# Patient Record
Sex: Female | Born: 1935
Health system: Southern US, Community
[De-identification: ages and names within clinical notes are randomized; demographics above are authoritative.]

## PROBLEM LIST (undated history)

## (undated) DIAGNOSIS — M1990 Unspecified osteoarthritis, unspecified site: Secondary | ICD-10-CM

## (undated) DIAGNOSIS — I442 Atrioventricular block, complete: Secondary | ICD-10-CM

## (undated) DIAGNOSIS — I483 Typical atrial flutter: Secondary | ICD-10-CM

## (undated) DIAGNOSIS — I5032 Chronic diastolic (congestive) heart failure: Secondary | ICD-10-CM

## (undated) DIAGNOSIS — E162 Hypoglycemia, unspecified: Secondary | ICD-10-CM

## (undated) DIAGNOSIS — Z950 Presence of cardiac pacemaker: Secondary | ICD-10-CM

## (undated) DIAGNOSIS — I4819 Other persistent atrial fibrillation: Secondary | ICD-10-CM

## (undated) DIAGNOSIS — I10 Essential (primary) hypertension: Secondary | ICD-10-CM

## (undated) DIAGNOSIS — E079 Disorder of thyroid, unspecified: Secondary | ICD-10-CM

## (undated) DIAGNOSIS — N200 Calculus of kidney: Secondary | ICD-10-CM

## (undated) DIAGNOSIS — M810 Age-related osteoporosis without current pathological fracture: Secondary | ICD-10-CM

## (undated) DIAGNOSIS — E039 Hypothyroidism, unspecified: Secondary | ICD-10-CM

## (undated) DIAGNOSIS — N189 Chronic kidney disease, unspecified: Secondary | ICD-10-CM

## (undated) HISTORY — DX: Hypoglycemia, unspecified: E16.2

## (undated) HISTORY — PX: INSERT / REPLACE / REMOVE PACEMAKER: SUR710

## (undated) HISTORY — PX: APPENDECTOMY: SHX54

## (undated) HISTORY — DX: Atrioventricular block, complete: I44.2

## (undated) HISTORY — DX: Disorder of thyroid, unspecified: E07.9

## (undated) HISTORY — DX: Calculus of kidney: N20.0

## (undated) HISTORY — DX: Age-related osteoporosis without current pathological fracture: M81.0

---

## 1999-07-24 ENCOUNTER — Other Ambulatory Visit: Admission: RE | Admit: 1999-07-24 | Discharge: 1999-07-24 | Payer: Self-pay | Admitting: Obstetrics and Gynecology

## 2000-10-03 ENCOUNTER — Encounter: Admission: RE | Admit: 2000-10-03 | Discharge: 2000-10-03 | Payer: Self-pay | Admitting: Internal Medicine

## 2000-10-03 ENCOUNTER — Encounter: Payer: Self-pay | Admitting: Internal Medicine

## 2000-10-31 ENCOUNTER — Other Ambulatory Visit: Admission: RE | Admit: 2000-10-31 | Discharge: 2000-10-31 | Payer: Self-pay | Admitting: Obstetrics and Gynecology

## 2002-02-24 ENCOUNTER — Encounter: Admission: RE | Admit: 2002-02-24 | Discharge: 2002-02-24 | Payer: Self-pay | Admitting: Internal Medicine

## 2002-02-24 ENCOUNTER — Encounter: Payer: Self-pay | Admitting: Internal Medicine

## 2002-02-26 ENCOUNTER — Other Ambulatory Visit: Admission: RE | Admit: 2002-02-26 | Discharge: 2002-02-26 | Payer: Self-pay | Admitting: *Deleted

## 2003-06-08 ENCOUNTER — Other Ambulatory Visit: Admission: RE | Admit: 2003-06-08 | Discharge: 2003-06-08 | Payer: Self-pay | Admitting: Obstetrics and Gynecology

## 2004-06-02 ENCOUNTER — Encounter: Admission: RE | Admit: 2004-06-02 | Discharge: 2004-06-02 | Payer: Self-pay | Admitting: Internal Medicine

## 2006-03-08 ENCOUNTER — Encounter: Admission: RE | Admit: 2006-03-08 | Discharge: 2006-03-08 | Payer: Self-pay | Admitting: Internal Medicine

## 2006-09-02 ENCOUNTER — Other Ambulatory Visit: Admission: RE | Admit: 2006-09-02 | Discharge: 2006-09-02 | Payer: Self-pay | Admitting: Obstetrics & Gynecology

## 2008-05-17 ENCOUNTER — Encounter: Admission: RE | Admit: 2008-05-17 | Discharge: 2008-05-17 | Payer: Self-pay | Admitting: Internal Medicine

## 2009-06-24 ENCOUNTER — Encounter: Admission: RE | Admit: 2009-06-24 | Discharge: 2009-06-24 | Payer: Self-pay | Admitting: Internal Medicine

## 2013-05-18 ENCOUNTER — Encounter: Payer: Self-pay | Admitting: Cardiovascular Disease

## 2013-05-18 ENCOUNTER — Ambulatory Visit (INDEPENDENT_AMBULATORY_CARE_PROVIDER_SITE_OTHER): Payer: Medicare Other | Admitting: Cardiovascular Disease

## 2013-05-18 VITALS — BP 148/68 | HR 63 | Ht 62.0 in | Wt 122.4 lb

## 2013-05-18 DIAGNOSIS — I456 Pre-excitation syndrome: Secondary | ICD-10-CM

## 2013-05-18 DIAGNOSIS — R531 Weakness: Secondary | ICD-10-CM | POA: Insufficient documentation

## 2013-05-18 DIAGNOSIS — R002 Palpitations: Secondary | ICD-10-CM

## 2013-05-18 DIAGNOSIS — R5381 Other malaise: Secondary | ICD-10-CM

## 2013-05-18 DIAGNOSIS — E039 Hypothyroidism, unspecified: Secondary | ICD-10-CM

## 2013-05-18 DIAGNOSIS — I10 Essential (primary) hypertension: Secondary | ICD-10-CM | POA: Insufficient documentation

## 2013-05-18 NOTE — Patient Instructions (Signed)
Your physician has recommended that you wear an event monitor. Event monitors are medical devices that record the heart's electrical activity. Doctors most often [place] these monitors to diagnose arrhythmias. Arrhythmias are problems with the speed or rhythm of the heartbeat. The monitor is a small, portable device. You can wear one while you do your normal daily activities. This is usually used to diagnose what is causing palpitations/syncope (passing out). He has ordered that you wear this for 30 days.  Your physician has requested that you have an echocardiogram. Echocardiography is a painless test that uses sound waves to create images of your heart. It provides your doctor with information about the size and shape of your heart and how well your heart's chambers and valves are working. This procedure takes approximately one hour. There are no restrictions for this procedure.  Your physician recommends that you schedule a follow-up appointment in: in 6 weeks.

## 2013-05-18 NOTE — Progress Notes (Signed)
Patient ID: Brandi Chambers, female   DOB: 11-22-1932, 79 y.o.   MRN: [id number]    PATIENT PROFILE: This Goral a 79 year old female who presents for cardiology evaluation through the courtesy of Dr. Dagmar Hait for further evaluation of weakness. There is a history of possible WPW.   HPI: Patient states that in the 1970s she was given a diagnosis of possible Wolff-Parkinson-White syndrome. Apparently she never had any definitive  electrophysiologic assessment. She recalls having some palpitations in the past for which he was put on verapamil and these seemed to have improved. Remotely she had seen Dr. Mare Ferrari. There is some record of possible stress testing but I do not have specifics. She recently saw Dr. Dagmar Hait on 05/08/2013. Laboratory revealed a glucose of 86 BUN 19 creatinine 1.0. Potassium was 4.9. TSH was 0.46 and free T4 was 1.2 on her current dose of Synthroid thyroidism at 75 mcg. Patient states when she takes her blood pressure at home he typically runs in the 130s to 140s. She does admit to increased anxiety when she comes to the doctor and at times her blood pressure in the physician's office is elevated. She at times notes a sensation of palpitations. She denies any associated presyncope. She denies any syncope or she does drink caffeine approximately 2 drinks per day. There is no alcohol use. She does exercise and remains active and denies any exertional precipitation of chest pain or arrhythmia. She presents to the office today for Cardiologic evaluation  Past Medical History  Diagnosis Date  . Thyroid disease   . Osteoporosis   . Nephrolithiasis   . Hypoglycemia   . Chest pain     No past surgical history on file.  No Known Allergies  Current Outpatient Prescriptions  Medication Sig Dispense Refill  . aspirin 81 MG tablet Take 81 mg by mouth daily.      Marland Kitchen levothyroxine (SYNTHROID, LEVOTHROID) 75 MCG tablet Take 75 mcg by mouth daily before breakfast.      . verapamil (CALAN) 80  MG tablet Take 80 mg by mouth daily.       No current facility-administered medications for this visit.    Socially she is married for 61 years she completed eighth grade. She remains active. She only minimally smoked remotely and quit in 1969. He does walk on a treadmill without problems the  Family History  Problem Relation Age of Onset  . Breast cancer Mother   . Lung cancer Father     ROS is negative for fever chills night sweats. She denies any weight gain or loss recently. She denies any bleeding. She denies any shortness of breath. She denies visual symptoms. At times she does note some mild dizziness. At times she has noted some mild nausea. She denies any exercise-induced chest pressure. Denies any difficulty with sleep. Her sleep is restorative . She is unaware of nocturnal palpitations. She denies change in bowel or bladder habits. She denies paresthesias. She denies any psychiatric disorders per other system review is negative.  PE BP 148/68  Pulse 63  Ht 5\' 2"  (1.575 m)  Wt 122 lb 6.4 oz (55.52 kg)  BMI 22.38 kg/m2 General: Alert, oriented, no distress.  HEENT: Normocephalic, atraumatic. Pupils round and reactive; sclera anicteric; Funduscopic exam reveals bilateral cataracts. I did not see any AV nicking hemorrhages or exudates. Nose without nasal septal hypertrophy Mouth/Parynx benign; Mallinpatti scale 2 Neck: No JVD, no carotid briuts Lungs: clear to ausculatation and percussion; no wheezing or rales Heart:  RRR, s1 s2 normal 1/6 systolic murmur in the aortic region. There was no S3 gallop. She did not have any rub.  Abdomen: soft, nontender; no hepatosplenomehaly, BS+; abdominal aorta nontender and not dilated by palpation. Pulses 2+ Extremities: no clubbinbg cyanosis or edema, Homan's sign negative  Neurologic: grossly nonfocal    ECG: Reveals normal sinus rhythm with mild sinus arrhythmia. PR interval is normal at 140 ms and does not demonstrate any preexcitation  or accelerated AV conduction. Her QTc interval is normal at 490 ms. There are no significant ST abnormalities.  LABS:  BMET No results found for this basename: na, k, cl, co2, glucose, bun, creatinine, calcium, gfrnonaa, gfraa     Hepatic Function Panel  No results found for this basename: prot, albumin, ast, alt, alkphos, bilitot, bilidir, ibili     CBC No results found for this basename: wbc, rbc, hgb, hct, plt, mcv, mch, mchc, rdw, neutrabs, lymphsabs, monoabs, eosabs, basosabs     BNP No results found for this basename: probnp    Lipid Panel  No results found for this basename: chol, trig, hdl, cholhdl, vldl, ldlcalc     RADIOLOGY: No results found.   ASSESSMENT AND PLAN: My impression is that Ms. Lye is a very pleasant 79 year old female who has a history of palpitations remotely paralleling since 1970s has been told of having possible Wolff-Parkinson-White syndrome. Presently, her EKG does not demonstrate any evidence for preexcitation or accelerated AV conduction with a normal PR interval and I am not certain that she truly has WPW. I am recommending she wear a 30 day cardiac monitor to see if we can cup any potential arrhythmias. I am also scheduling her for a 2-D echo Doppler study. Her blood pressure in the office today was elevated reflective of labile hypertension with a systolic blood pressure by me at 180. She tells me she often gets nervous coming to the doctor told her blood pressure typically is in the [id number] range systolically. Did review her laboratory. I will not make additional medication changes presently. I will wait for the above studies prior to further recommendations.    Troy Sine, MD, [facility] 05/18/2013 3:54 PM

## 2013-05-20 ENCOUNTER — Encounter: Payer: Self-pay | Admitting: Cardiovascular Disease

## 2013-05-26 ENCOUNTER — Ambulatory Visit (HOSPITAL_COMMUNITY)
Admission: RE | Admit: 2013-05-26 | Discharge: 2013-05-26 | Disposition: A | Discharge status: Home / Self Care | Payer: Medicare Other | Source: Ambulatory Visit | Attending: Cardiovascular Disease | Admitting: Cardiovascular Disease

## 2013-05-26 DIAGNOSIS — I079 Rheumatic tricuspid valve disease, unspecified: Secondary | ICD-10-CM | POA: Insufficient documentation

## 2013-05-26 DIAGNOSIS — R002 Palpitations: Secondary | ICD-10-CM | POA: Insufficient documentation

## 2013-05-26 DIAGNOSIS — I456 Pre-excitation syndrome: Secondary | ICD-10-CM

## 2013-05-26 DIAGNOSIS — R42 Dizziness and giddiness: Secondary | ICD-10-CM | POA: Insufficient documentation

## 2013-05-26 DIAGNOSIS — I080 Rheumatic disorders of both mitral and aortic valves: Secondary | ICD-10-CM | POA: Insufficient documentation

## 2013-05-26 DIAGNOSIS — I10 Essential (primary) hypertension: Secondary | ICD-10-CM | POA: Insufficient documentation

## 2013-05-26 DIAGNOSIS — I319 Disease of pericardium, unspecified: Secondary | ICD-10-CM | POA: Insufficient documentation

## 2013-05-26 DIAGNOSIS — E079 Disorder of thyroid, unspecified: Secondary | ICD-10-CM | POA: Insufficient documentation

## 2013-05-26 NOTE — Progress Notes (Signed)
2D Echo Performed 05/26/2013    Marygrace Drought, RCS

## 2013-07-07 ENCOUNTER — Encounter: Payer: Self-pay | Admitting: *Deleted

## 2013-07-10 ENCOUNTER — Encounter: Payer: Self-pay | Admitting: Cardiovascular Disease

## 2013-07-10 ENCOUNTER — Ambulatory Visit (INDEPENDENT_AMBULATORY_CARE_PROVIDER_SITE_OTHER): Payer: Medicare Other | Admitting: Cardiovascular Disease

## 2013-07-10 VITALS — BP 142/86 | HR 60 | Ht 62.5 in | Wt 120.6 lb

## 2013-07-10 DIAGNOSIS — E039 Hypothyroidism, unspecified: Secondary | ICD-10-CM

## 2013-07-10 DIAGNOSIS — I498 Other specified cardiac arrhythmias: Secondary | ICD-10-CM

## 2013-07-10 DIAGNOSIS — I10 Essential (primary) hypertension: Secondary | ICD-10-CM

## 2013-07-10 DIAGNOSIS — I471 Supraventricular tachycardia: Secondary | ICD-10-CM

## 2013-07-10 DIAGNOSIS — R531 Weakness: Secondary | ICD-10-CM

## 2013-07-10 DIAGNOSIS — R002 Palpitations: Secondary | ICD-10-CM

## 2013-07-10 DIAGNOSIS — R5381 Other malaise: Secondary | ICD-10-CM

## 2013-07-10 MED ORDER — METOPROLOL TARTRATE 50 MG PO TABS: 50.0000 mg | ORAL_TABLET | Freq: Two times a day (BID) | ORAL | Status: DC

## 2013-07-10 NOTE — Patient Instructions (Addendum)
You have been referred to [name] at [facility] has recommended you make the following change in your medication: STOP Verapramil start Metoprolol 50 mg twice a day

## 2013-07-26 ENCOUNTER — Encounter: Payer: Self-pay | Admitting: Cardiovascular Disease

## 2013-07-26 DIAGNOSIS — I471 Supraventricular tachycardia: Secondary | ICD-10-CM | POA: Insufficient documentation

## 2013-07-26 NOTE — Progress Notes (Signed)
Patient ID: VAANYA ALVORD, female   DOB: 06-Jul-1932, 79 y.o.   MRN: [id number]     HPI: Ms. Latresha Cina presents to the office today for followup evaluation of her initial 05/18/2013 cardiology evaluation.  Patient states that in the 1970s she was given a diagnosis of possible Wolff-Parkinson-White syndrome when she was seeing Dr Rueben Bash .  She never had any subsequent definitive  electrophysiologic assessment. She was started on  on verapamil and her palpitations seemed to have improved. Subsequently, she had seen Dr. Mare Ferrari. There is some record of possible stress testing but I do not have specifics. She saw Dr. Dagmar Hait on 05/08/2013. Laboratory revealed a glucose of 86 BUN 19 creatinine 1.0. Potassium was 4.9. TSH was 0.46 and free T4 was 1.2 on her current dose of Synthroid thyroidism at 75 mcg. Patient states when she takes her blood pressure at home he typically runs in the 130s to 140s. She does admit to increased anxiety when she comes to the doctor and at times her blood pressure in the physician's office is elevated.  When I saw her initially, her EKG did not reveal any definitive evidence for preexcitation or accelerated AV conduction. Her PR  was normal at 140 msec. I did recommend she wear a CardioNet monitor and also scheduled her for 2-D echo Doppler study. On echocardiography her ejection fraction was in the range of 55-60%. She had grade 2 diastolic dysfunction. There was evidence for aortic sclerosis without stenosis with mild AR and she had mitral annular calcification with mild mitral regurgitation. One CardioNet monitoring, she apparently has been demonstrated to have the bursa atrial tachycardia probable SVT as well as frequent PACs. Rates vary and were in the range of 150 beats per minute and occurred in paroxysms of 7, 14, beats per also had a 12 beat run 17 beat run at 159 and 161. However, apparently on 06/05/2013 was a rapid episode with reported heart rate at 229 beats per  minute. These all broke spontaneously to she presents for evaluation.  Past Medical History  Diagnosis Date  . Thyroid disease   . Osteoporosis   . Nephrolithiasis   . Hypoglycemia   . Chest pain     No past surgical history on file.  No Known Allergies  Current Outpatient Prescriptions  Medication Sig Dispense Refill  . aspirin 81 MG tablet Take 81 mg by mouth as needed.       . diazepam (VALIUM) 5 MG tablet Take 5 mg by mouth as needed for anxiety. Takes 1/2 tablet.      Marland Kitchen levothyroxine (SYNTHROID, LEVOTHROID) 75 MCG tablet Take 75 mcg by mouth daily before breakfast.      . metoprolol (LOPRESSOR) 50 MG tablet Take 1 tablet (50 mg total) by mouth 2 (two) times daily.  60 tablet  6   No current facility-administered medications for this visit.    Socially she is married for 61 years she completed eighth grade. She remains active. She only minimally smoked remotely and quit in 1969. He does walk on a treadmill without problems the  Family History  Problem Relation Age of Onset  . Breast cancer Mother   . Lung cancer Father     ROS is negative for fever chills night sweats. She does admit to a sensation of lightheadedness when her heart rate speeds up. There is no frank syncope. She denies associated chest pressure. She denies any weight gain or loss recently. She denies any bleeding. She denies  any shortness of breath. She denies visual symptoms.  At times she has noted some mild nausea.  Denies any difficulty with sleep. Her sleep is restorative . She is unaware of nocturnal palpitations. She denies change in bowel or bladder habits. She denies paresthesias. She denies any psychiatric disorders. other system review is negative.  PE BP 142/86  Pulse 60  Ht 5' 2.5" (1.588 m)  Wt 120 lb 9.6 oz (54.704 kg)  BMI 21.69 kg/m2 General: Alert, oriented, no distress.  HEENT: Normocephalic, atraumatic. Pupils round and reactive; sclera anicteric; Funduscopic exam reveals bilateral  cataracts. I did not see any AV nicking hemorrhages or exudates. Nose without nasal septal hypertrophy Mouth/Parynx benign; Mallinpatti scale 2 Neck: No JVD, no carotid briuts Lungs: clear to ausculatation and percussion; no wheezing or rales Heart: RRR, s1 s2 normal 1/6 systolic murmur in the aortic region. There was no S3 gallop. She did not have any rub.  Abdomen: soft, nontender; no hepatosplenomehaly, BS+; abdominal aorta nontender and not dilated by palpation. Pulses 2+ Extremities: no clubbinbg cyanosis or edema, Homan's sign negative  Neurologic: grossly nonfocal    ECG: Reveals normal sinus rhythm with mild sinus arrhythmia. PR interval is normal at 144 ms and does not demonstrate any preexcitation or accelerated AV conduction. Her QTc interval is normal at 420 ms. There are no significant ST abnormalities.  LABS:  BMET No results found for this basename: na,  k,  cl,  co2,  glucose,  bun,  creatinine,  calcium,  gfrnonaa,  gfraa     Hepatic Function Panel  No results found for this basename: prot,  albumin,  ast,  alt,  alkphos,  bilitot,  bilidir,  ibili     CBC No results found for this basename: wbc,  rbc,  hgb,  hct,  plt,  mcv,  mch,  mchc,  rdw,  neutrabs,  lymphsabs,  monoabs,  eosabs,  basosabs     BNP No results found for this basename: probnp    Lipid Panel  No results found for this basename: chol,  trig,  hdl,  cholhdl,  vldl,  ldlcalc     RADIOLOGY: No results found.   ASSESSMENT AND PLAN:  Ms. Bladen is a 79 year old female with a long history of palpitations and has been treated with verapamil for many years. On verapamil, she's been demonstrated to have recurrent short-lived episodes of probable supraventricular tachycardia with rates in the 130s to 160s and on one instance she did have a significant episode of tachycardia up to 229 beats per minute. Her EKG does not definitively reveal preexcitation. Her PR interval is normal. I am recommending  she discontinue her Verapamil SR and in its place will start beta-blockade with initially Lopressor 50 mg twice a day. I am referring her to Dr. [name] for electrophysiologic evaluation and possible ablation.  Troy Sine, MD, [facility] 07/26/2013 7:47 AM

## 2013-08-03 ENCOUNTER — Telehealth: Payer: Self-pay | Admitting: Cardiovascular Disease

## 2013-08-03 NOTE — Telephone Encounter (Signed)
Returned call.  Pt stated she is having a little problem with the medication.  Stated she can't remember anything, but it is controlling her BP.  Stated she has been very forgetful since starting med.  Pt stated she is supposed to take one in the morning and one at night.  Stated the last two nights and it seems like it's a little better.  Denied means to check BP at home.  Also c/o dizziness today ONLY.  Stated palpitations seem to be alright per pt.  Pt informed Dr. Claiborne Billings will be notified for further instructions.  Pt verbalized understanding and agreed w/ plan.  Message forwarded to Dr. Arnette Norris, CMA.

## 2013-08-03 NOTE — Telephone Encounter (Signed)
Brandi Chambers is having problem with BP med metroprolol  Is very dizzy and forgetful

## 2013-08-04 NOTE — Telephone Encounter (Signed)
Message forwarded to L. Dorene Ar, NP for further instructions.

## 2013-08-04 NOTE — Telephone Encounter (Signed)
Call to pt and line busy x 3.    Returned call to pharmacy to Amy and informed RN is trying to contact pt for appt for evaluation and unable to reach her.  Asked that she inform pt if she calls back.  Agreed and stated she will try to contact pt too.

## 2013-08-04 NOTE — Telephone Encounter (Signed)
Returned call to pt and informed L. Dorene Ar, NP would like her to come in for evaluation for BP check tomorrow.  Pt stated she has transportation and agreed not drive.  Appt scheduled for tomorrow (8.20.14) at 9:20am for BP check.

## 2013-08-04 NOTE — Telephone Encounter (Signed)
Pharmacist called said pt came in still having dizzy spells-they took her BP it was 128/60. She already cut back to 25mg  of Metoprolol bid-should she cut back more on medicine or try something else?

## 2013-08-05 ENCOUNTER — Encounter: Payer: Self-pay | Admitting: Cardiology

## 2013-08-05 ENCOUNTER — Ambulatory Visit (INDEPENDENT_AMBULATORY_CARE_PROVIDER_SITE_OTHER): Payer: Medicare Other | Admitting: Cardiology

## 2013-08-05 VITALS — BP 126/82 | HR 44 | Ht 62.5 in | Wt 122.1 lb

## 2013-08-05 DIAGNOSIS — R001 Bradycardia, unspecified: Secondary | ICD-10-CM | POA: Insufficient documentation

## 2013-08-05 DIAGNOSIS — E039 Hypothyroidism, unspecified: Secondary | ICD-10-CM

## 2013-08-05 DIAGNOSIS — I471 Supraventricular tachycardia: Secondary | ICD-10-CM

## 2013-08-05 DIAGNOSIS — R0789 Other chest pain: Secondary | ICD-10-CM

## 2013-08-05 DIAGNOSIS — R002 Palpitations: Secondary | ICD-10-CM

## 2013-08-05 MED ORDER — METOPROLOL TARTRATE 25 MG PO TABS: 12.5000 mg | ORAL_TABLET | Freq: Two times a day (BID) | ORAL | Status: DC

## 2013-08-05 NOTE — Assessment & Plan Note (Signed)
Secondary to bradycardia. If pressure continues episodically once her rate is at normal rate she would need Lexiscan myoview.  No acute EKG changes except slow rate.

## 2013-08-05 NOTE — Patient Instructions (Signed)
No metoprolol tonight  In AM begin half of 25 mg tablet to equal 12.5 mg, take twice a day.    If you have break through fast heart rate, take one  25 mg tab extra. For the day.  Call if you have increased Heart rate.   If you have more chest pressure call us.

## 2013-08-05 NOTE — Assessment & Plan Note (Signed)
Stable followed by PCP  

## 2013-08-05 NOTE — Progress Notes (Signed)
08/05/2013   PCP: Tivis Ringer, MD   Chief Complaint  Patient presents with  . hypotension    associated with lightheadedness/dizziness; 7/25 with TK start Metoprolol 50 mg BID (changed from verapamil) - has been taking 25mg  at night since saturday, and has not helped; c/o palpitations, but not as frequent; only took 25mg  lopressor this AM    Primary Cardiologist: Dr. Claiborne Billings  HPI: 79 year old white female presents today at our request for increased dizziness, lightheadedness, feeling as if she may pass out with recent adjustment in medication.  In the 1970s she was given a diagnosis of possible Wolff-Parkinson-White syndrome when she was seeing Dr Rueben Bash . She never had any subsequent definitive electrophysiologic assessment. She was started on on verapamil and her palpitations seemed to have improved. Subsequently, she had seen Dr. Mare Ferrari. There is some record of possible stress testing but I do not have specifics. She saw Dr. Dagmar Hait on 05/08/2013. Laboratory revealed a glucose of 86 BUN 19 creatinine 1.0. Potassium was 4.9. TSH was 0.46 and free T4 was 1.2 on her current dose of Synthroid thyroidism at 75 mcg  With her original visit to Dr. Claiborne Billings her EKG did not reveal any definitive evidence for preexcitation or accelerated AV conduction. Her PR was normal at 140 msec.  She did wear a CardioNet monitor and had a 2-D echo Doppler study. On echocardiography her ejection fraction was in the range of 55-60%. She had grade 2 diastolic dysfunction. There was evidence for aortic sclerosis without stenosis with mild AR and she had mitral annular calcification with mild mitral regurgitation. One CardioNet monitoring, she apparently demonstrated to have burst of atrial tachycardia probable SVT as well as frequent PACs. Rates vary and were in the range of 150 beats per minute and occurred in paroxysms of 7, 14, beats per also had a 12 beat run 17 beat run at 159 and 161. However,  apparently on 06/05/2013 was a rapid episode with reported heart rate at 229 beats per minute. These all broke spontaneously.  Her Calan was stopped and Lopressor 50 mg twice a day was added to her medical regimen. Since that time she has become more and more lightheaded and dizzy feeling quite weak she especially felt bad yesterday and called our office. Her blood pressure yesterday was 0000000 systolic.  We asked her to come in.  Today she still feels weak she decrease the metoprolol to 25 mg last night and 25 mg this morning here in the office she is not orthostatic but her heart rate is 39 beats per minute she is in a sinus bradycardia and other than bradycardia and no acute EKG changes.  She did relate 2 episodes of mild brief chest discomfort which may be related to the heart rate alone.  She does have some nausea at times with the bradycardia as well.    No Known Allergies  Current Outpatient Prescriptions  Medication Sig Dispense Refill  . aspirin 81 MG tablet Take 81 mg by mouth as needed.       . diazepam (VALIUM) 5 MG tablet Take 5 mg by mouth as needed for anxiety. Takes 1/2 tablet.      Marland Kitchen levothyroxine (SYNTHROID, LEVOTHROID) 75 MCG tablet Take 75 mcg by mouth daily before breakfast.      . metoprolol tartrate (LOPRESSOR) 25 MG tablet Take 0.5 tablets (12.5 mg total) by mouth 2 (two) times daily.  30 tablet  3   No current facility-administered  medications for this visit.    Past Medical History  Diagnosis Date  . Thyroid disease   . Osteoporosis   . Nephrolithiasis   . Hypoglycemia   . Chest pain   . Tachycardia   . Echocardiogram abnormal 2014    grade 2 diastolic dysfunction, EF 0000000, mild AR, mitral anular calcification with mild MR   . Paroxysmal SVT (supraventricular tachycardia) 2014    poss. W-P-W, breakthrough on Verapamil    History reviewed. No pertinent past surgical history.  [id number] colds or fevers, no weight changes Skin:no rashes or  ulcers HEENT:no blurred vision, no congestion CV:see HPI PUL:no SOB GI:no diarrhea constipation or melena, no indigestion GU:no hematuria, no dysuria MS:no joint pain, no claudication Neuro:no syncope though so weak she felt she may pass out, + lightheadedness, + dizziness Endo:no diabetes, + thyroid disease stable  PHYSICAL EXAM BP 126/82  Pulse 44  Ht 5' 2.5" (1.588 m)  Wt 122 lb 1.6 oz (55.384 kg)  BMI 21.96 kg/m2 General:Pleasant affect, NAD Skin:Warm and dry, brisk capillary refill HEENT:normocephalic, sclera clear, mucus membranes moist Neck:supple, no JVD, no bruits  Heart:S1S2 RRR though slow, without murmur, gallup, rub or click Lungs:clear without rales, rhonchi, or wheezes [id number], non tender, + BS, do not palpate liver spleen or masses Ext:no lower ext edema, 2+ pedal pulses, 2+ radial pulses Neuro:alert and oriented, MAE, follows commands, + facial symmetry  EKG: Sinus bradycardia rate of 39 no acute EKG changes otherwise, just much slower.  ASSESSMENT AND PLAN Symptomatic bradycardia With lopressor 50 mg twice a day HR down to 30's and she became symptomatic with dizziness, lightheadedness.  Today BP stable but HR 39 S [place].  She had decreased metoprolol to 25 the last 2 doses. She will hold tonight status and tomorrow begin 12.5 mg twice a day she has regular tachycardia she will take one extra dose of 25 mg in a 24-hour period.  She will call as this she has breakthrough tachycardia. She is to see Dr. [name] 08/24/2013.  I did discuss this with Dr. Sallyanne Kuster.  SVT (supraventricular tachycardia) History of years of SVT thought to be WPW.  Has been treated with Calan-and only the brand name worked-80 mg daily for many years but recently was having breakthrough tachycardia that is symptomatic. Calan was changed to Lopressor.  Hypothyroid Stable followed by PCP.  Chest pressure Secondary to bradycardia. If pressure continues episodically once her rate is at normal  rate she would need Lexiscan myoview.  No acute EKG changes except slow rate.   We'll have her come back Thursday for an EKG to ensure her heart rate has improved.

## 2013-08-05 NOTE — Assessment & Plan Note (Addendum)
With lopressor 50 mg twice a day HR down to 30's and she became symptomatic with dizziness, lightheadedness.  Today BP stable but HR 39 S Brady.  She had decreased metoprolol to 25 the last 2 doses. She will hold tonight status and tomorrow begin 12.5 mg twice a day she has regular tachycardia she will take one extra dose of 25 mg in a 24-hour period.  She will call as this she has breakthrough tachycardia. She is to see Dr. [name] 08/24/2013.  I did discuss this with Dr. Sallyanne Kuster.

## 2013-08-05 NOTE — Assessment & Plan Note (Signed)
History of years of SVT thought to be WPW.  Has been treated with Calan-and only the brand name worked-80 mg daily for many years but recently was having breakthrough tachycardia that is symptomatic. Calan was changed to Lopressor.

## 2013-08-06 ENCOUNTER — Telehealth: Payer: Self-pay | Admitting: *Deleted

## 2013-08-06 NOTE — Telephone Encounter (Signed)
Call me back @ [phone].

## 2013-08-06 NOTE — Telephone Encounter (Signed)
Patient returned a call to me. Per [name] she requested for the patient to come today and get a EKG. Patient states that she has just gotten home and prefers not to come back to town. Told her per Mickel Baas she can come tomorrow. She cannot come then neither. She will be going out of town in the morning around 7:00 a.m. During the conversation she informed me that she had elevated HR this morning and took "1/2 of a pill, and then in about 1 hour I felt fine." She informs [place] that her heart rate was 58 when she checked it around 11:30 a.m. I told her per [name] to monitor her heartrate over the weekend. Call us on Monday to inform us of how she is feeling, and what her heartrate has been throughout the weekend. Patient voiced understanding and states that she will call us sooner if she has problems over the weekend.

## 2013-08-14 ENCOUNTER — Ambulatory Visit: Payer: Medicare Other | Admitting: Internal Medicine

## 2013-08-24 ENCOUNTER — Encounter: Payer: Self-pay | Admitting: Internal Medicine

## 2013-08-24 ENCOUNTER — Ambulatory Visit (INDEPENDENT_AMBULATORY_CARE_PROVIDER_SITE_OTHER): Payer: Medicare Other | Admitting: Internal Medicine

## 2013-08-24 VITALS — BP 142/78 | HR 58 | Ht 62.0 in | Wt 121.0 lb

## 2013-08-24 DIAGNOSIS — R002 Palpitations: Secondary | ICD-10-CM

## 2013-08-24 DIAGNOSIS — I498 Other specified cardiac arrhythmias: Secondary | ICD-10-CM

## 2013-08-24 DIAGNOSIS — R001 Bradycardia, unspecified: Secondary | ICD-10-CM

## 2013-08-24 MED ORDER — METOPROLOL TARTRATE 25 MG PO TABS: ORAL_TABLET | ORAL | Status: DC

## 2013-08-24 NOTE — Patient Instructions (Addendum)
Your physician recommends that you schedule a follow-up appointment in 3 months with Dr Allred    

## 2013-08-24 NOTE — Progress Notes (Signed)
Primary Care Physician: Tivis Ringer, MD Referring Physician:  Dr Corky Downs   Brandi Chambers is a 79 y.o. female with a h/o SVT who presents for EP consultation.  She reports having episodic palpitations for years.  She carries a diagnosis of SVT and has previously been treated with good success with verapamil.  She reports that recently, she began having breakthrough of her SVT about once per month.  She reports abrupt onset of tachypalpitations with fatigue.  She has found that by massaging her neck that she could terminate episodes.  She is unaware of precipitants or triggers for episodes.  She reports that episodes typically last less than a minute.  With increased frequency of events, she has an event monitor placed.  This documented several episodes of nonsustained long RP tachycardia 150-160 bpm.  She also had a single episode of short RP tachycardia at 220 bpm which spontaneously terminated.   She was placed on metoprolol which initially caused dizziness.  With reduction of dosing, she has had resolution of her dizziness.  She has had no SVT in over a month.  Presently, she is very pleased with her health state.   Today, she denies symptoms of chest pain, shortness of breath, orthopnea, PND, lower extremity edema, dizziness, presyncope, syncope, or neurologic sequela. The patient is tolerating medications without difficulties and is otherwise without complaint today.   Past Medical History  Diagnosis Date  . Thyroid disease   . Osteoporosis   . Nephrolithiasis   . Hypoglycemia   . Chest pain   . Echocardiogram abnormal 2014    grade 2 diastolic dysfunction, EF 0000000, mild AR, mitral anular calcification with mild MR   . Paroxysmal SVT (supraventricular tachycardia) 2014   No past surgical history on file.  Current Outpatient Prescriptions  Medication Sig Dispense Refill  . diazepam (VALIUM) 5 MG tablet Take 5 mg by mouth as needed for anxiety. Takes 1/2 tablet.      Marland Kitchen  levothyroxine (SYNTHROID, LEVOTHROID) 75 MCG tablet Take 75 mcg by mouth daily before breakfast.      . metoprolol tartrate (LOPRESSOR) 25 MG tablet Take 1/2 tablet in the am and 1/4 tablet in the afternoon  30 tablet  11   No current facility-administered medications for this visit.    No Known Allergies  History   Social History  . Marital Status: Married    Spouse Name: N/A    Number of Children: 1  . Years of Education: N/A   Occupational History  . Not on file.   Social History Main Topics  . Smoking status: Former Smoker    Types: Cigarettes    Quit date: 05/18/1969  . Smokeless tobacco: Never Used  . Alcohol Use: No  . Drug Use: No  . Sexual Activity: Not on file   Other Topics Concern  . Not on file   Social History Narrative   Lives in [place] with her spouse.  Retired    Family History  Problem Relation Age of Onset  . Breast cancer Mother   . Lung cancer Father     ROS- All systems are reviewed and negative except as per the HPI above  Physical Exam: Filed Vitals:   08/24/13 1450  BP: 142/78  Pulse: 58  Height: 5\' 2"  (1.575 m)  Weight: 121 lb (54.885 kg)    GEN- The patient is well appearing, alert and oriented x 3 today.   Head- normocephalic, atraumatic Eyes-  Sclera clear, conjunctiva pink  Ears- hearing intact Oropharynx- clear Neck- supple, no JVP Lymph- no cervical lymphadenopathy Lungs- Clear to ausculation bilaterally, normal work of breathing Heart- Regular rate and rhythm, no murmurs, rubs or gallops, PMI not laterally displaced GI- soft, NT, ND, + BS Extremities- no clubbing, cyanosis, or edema MS- no significant deformity or atrophy Skin- no rash or lesion Psych- euthymic mood, full affect Neuro- strength and sensation are intact  EKG today reveals sinus rhythm 58 bpm, otherwise normal ekg, no obvious preexcitation Echo reviewed Epic notes and Dr Ermalinda Memos notes are reviewed Event monitor is reviewed  Assessment and  Plan:  1. SVT The patient has documented short RP SVT.  She has previously failed medical therapy with verapamil but feels that she has done better with metoprolol. Therapeutic strategies for supraventricular tachycardia including medicine and ablation were discussed in detail with the patient today. Risk, benefits, and alternatives to EP study and radiofrequency ablation were also discussed in detail today. These risks include but are not limited to stroke, bleeding, vascular damage, tamponade, perforation, damage to the heart and other structures, AV block requiring pacemaker, worsening renal function, and death. The patient understands these risk and is clear at this time that she would like to avoid ablation. She will reconsider if her SVT burden increases.  I will therefore see her in 3 months for further discussion. She will follow with Dr Georgina Peer as scheduled in the interim.

## 2013-10-25 ENCOUNTER — Emergency Department (HOSPITAL_COMMUNITY)
Admission: EM | Admit: 2013-10-25 | Discharge: 2013-10-25 | Disposition: A | Discharge status: Home / Self Care | Payer: Medicare Other | Attending: Emergency Medicine | Admitting: Emergency Medicine

## 2013-10-25 ENCOUNTER — Encounter (HOSPITAL_COMMUNITY): Payer: Self-pay | Admitting: Emergency Medicine

## 2013-10-25 DIAGNOSIS — I471 Supraventricular tachycardia, unspecified: Secondary | ICD-10-CM | POA: Insufficient documentation

## 2013-10-25 DIAGNOSIS — R040 Epistaxis: Secondary | ICD-10-CM

## 2013-10-25 DIAGNOSIS — Z8739 Personal history of other diseases of the musculoskeletal system and connective tissue: Secondary | ICD-10-CM | POA: Insufficient documentation

## 2013-10-25 DIAGNOSIS — E079 Disorder of thyroid, unspecified: Secondary | ICD-10-CM | POA: Insufficient documentation

## 2013-10-25 DIAGNOSIS — Z87891 Personal history of nicotine dependence: Secondary | ICD-10-CM | POA: Insufficient documentation

## 2013-10-25 DIAGNOSIS — Z87442 Personal history of urinary calculi: Secondary | ICD-10-CM | POA: Insufficient documentation

## 2013-10-25 DIAGNOSIS — Z79899 Other long term (current) drug therapy: Secondary | ICD-10-CM | POA: Insufficient documentation

## 2013-10-25 DIAGNOSIS — I10 Essential (primary) hypertension: Secondary | ICD-10-CM

## 2013-10-25 LAB — CBC WITH DIFFERENTIAL/PLATELET
Basophils Absolute: 0 10*3/uL (ref 0.0–0.1)
Basophils Relative: 0 % (ref 0–1)
Eosinophils Absolute: 0.1 10*3/uL (ref 0.0–0.7)
Eosinophils Relative: 1 % (ref 0–5)
HCT: 39 % (ref 36.0–46.0)
Hemoglobin: 13.4 g/dL (ref 12.0–15.0)
Lymphocytes Relative: 21 % (ref 12–46)
Lymphs Abs: 2 10*3/uL (ref 0.7–4.0)
MCH: 29.6 pg (ref 26.0–34.0)
MCHC: 34.4 g/dL (ref 30.0–36.0)
MCV: 86.1 fL (ref 78.0–100.0)
Monocytes Absolute: 0.8 10*3/uL (ref 0.1–1.0)
Monocytes Relative: 9 % (ref 3–12)
Neutro Abs: 6.6 10*3/uL (ref 1.7–7.7)
Neutrophils Relative %: 69 % (ref 43–77)
Platelets: 232 10*3/uL (ref 150–400)
RBC: 4.53 MIL/uL (ref 3.87–5.11)
RDW: 14.4 % (ref 11.5–15.5)
WBC: 9.5 10*3/uL (ref 4.0–10.5)

## 2013-10-25 MED ORDER — MECLIZINE HCL 25 MG PO TABS: 50.0000 mg | ORAL_TABLET | Freq: Once | ORAL | Status: DC

## 2013-10-25 MED ORDER — DIAZEPAM 2 MG PO TABS: 2.0000 mg | ORAL_TABLET | Freq: Once | ORAL | Status: DC

## 2013-10-25 NOTE — Consult Note (Signed)
Brandi Chambers, [name] 79 y.o., female [id number]     Chief Complaint:   RIGHT epistaxis  HPI: 79 yo wf had unprovoked onset RIGHT nosebleed yesterday AM.  Bled off and on all day.  Today, with some clots.  Was seen in Urgent care today with some RIGHT anterior silver nitrate cautery and finally a pack placed with incomplete control.  Has been having some blood pressure issues the past 6 mos.  No ASA, Plavix, Coumadin or other blood thinners.  No known bleeding tendencies.  No prior nose or sinus issues.  PMH: Past Medical History  Diagnosis Date  . Thyroid disease   . Osteoporosis   . Nephrolithiasis   . Hypoglycemia   . Chest pain   . Echocardiogram abnormal 2014    grade 2 diastolic dysfunction, EF 0000000, mild AR, mitral anular calcification with mild MR   . Paroxysmal SVT (supraventricular tachycardia) 2014    Surg [id number] reviewed. No pertinent past surgical history.  FHx:   Family History  Problem Relation Age of Onset  . Breast cancer Mother   . Lung cancer Father    SocHx:  reports that she quit smoking about 44 years ago. Her smoking use included Cigarettes. She smoked 0.00 packs per day. She has never used smokeless tobacco. She reports that she does not drink alcohol or use illicit drugs.  ALLERGIES: No Known Allergies   (Not in a hospital admission)  Results for orders placed during the hospital encounter of 10/25/13 (from the past 48 hour(s))  CBC WITH DIFFERENTIAL     Status: None   Collection Time    10/25/13 10:44 AM      Result Value Range   WBC 9.5  4.0 - 10.5 K/uL   RBC 4.53  3.87 - 5.11 MIL/uL   Hemoglobin 13.4  12.0 - 15.0 g/dL   HCT 39.0  36.0 - 46.0 %   MCV 86.1  78.0 - 100.0 fL   MCH 29.6  26.0 - 34.0 pg   MCHC 34.4  30.0 - 36.0 g/dL   RDW 14.4  11.5 - 15.5 %   Platelets 232  150 - 400 K/uL   Neutrophils Relative % 69  43 - 77 %   Neutro Abs 6.6  1.7 - 7.7 K/uL   Lymphocytes Relative 21  12 - 46 %   Lymphs Abs 2.0  0.7 - 4.0 K/uL   Monocytes  Relative 9  3 - 12 %   Monocytes Absolute 0.8  0.1 - 1.0 K/uL   Eosinophils Relative 1  0 - 5 %   Eosinophils Absolute 0.1  0.0 - 0.7 K/uL   Basophils Relative 0  0 - 1 %   Basophils Absolute 0.0  0.0 - 0.1 K/uL   No results found.    Blood pressure 163/89, pulse 71, temperature 98.2 F (36.8 C), temperature source Oral, resp. rate 16, SpO2 95.00%.  PHYSICAL EXAM: Overall appearance:  Thin, healthy, cooperative and non distressed. Head:  NCAT Ears:  Did not examine Nose:  Pack in RIGHT side.  Mild RIGHT septal deviation.  Upon removing packing, severeal superficial cautery sites notes.  No other bleeding sites.  With gentle manipulation, an apparent granuloma with incomplete cautery control identified on the anterior septum Oral Cavity:  clear Oral Pharynx/Hypopharynx/Larynx:  Not examined Neuro:  Grossly intact Neck:  clear    Assessment/Plan RIGHT anterior septal granuloma with epistaxis.  Probable contributions from hypertension and drying from heat.  WIth informed consent, I anesthetized  the anterior RIGHT nose with xylocaine + Afrin spray, then on cotton pledgets.  After several minutes, the packs were removed.  Findings as described above.  Septal granuloma was felt to the site of bleeding.  This was thoroughly silver nitrate cauterized with good tolerance.  Bacitracin ointment applied.   Nasal hygiene instructions written.  No Rx's.  Re check my office if needed 4 weeks, sooner for active bleeding.    She and her husband understand and agree.  Jodi Marble 0000000, 1:38 PM

## 2013-10-25 NOTE — ED Notes (Addendum)
Pt c/o nose bleed that started yesterday morning. Went to [facility] in [place] and they cauterized vessels in nose, put packing in and was sent home and told to come to ED if continued to bleed. Pt stated that she can feel the blood going into back of throat and she is spitting it up so she does not get nauseated and vomit. Stated the only pain is a headache that started last night. Denies any blood thinners. Stated that she might be a little lightheaded.

## 2013-10-25 NOTE — ED Provider Notes (Signed)
CSN: [id number]     Arrival date & time 10/25/13  [id number] History   First MD Initiated Contact with Patient 10/25/13 1003     Chief Complaint  Patient presents with  . Epistaxis   (Consider location/radiation/quality/duration/timing/severity/associated sxs/prior Treatment) HPI Comments: Pt comes in with cc of bloody nose. Pt started having the bleeding yday, and was seen at an urgent care where she had an anterior packing done. Pt started having some bleeding again shortly after the discharge. Pt has been spitting out some clots. Not on anticoagulants. No dizziness.  Patient is a 79 y.o. female presenting with nosebleeds. The history is provided by the patient.  Epistaxis Associated symptoms: no dizziness     Past Medical History  Diagnosis Date  . Thyroid disease   . Osteoporosis   . Nephrolithiasis   . Hypoglycemia   . Chest pain   . Echocardiogram abnormal 2014    grade 2 diastolic dysfunction, EF 0000000, mild AR, mitral anular calcification with mild MR   . Paroxysmal SVT (supraventricular tachycardia) 2014   History reviewed. No pertinent past surgical history. Family History  Problem Relation Age of Onset  . Breast cancer Mother   . Lung cancer Father    History  Substance Use Topics  . Smoking status: Former Smoker    Types: Cigarettes    Quit date: 05/18/1969  . Smokeless tobacco: Never Used  . Alcohol Use: No   OB History   Grav Para Term Preterm Abortions TAB SAB Ect Mult Living                 Review of Systems  HENT: Positive for nosebleeds.   Respiratory: Negative for chest tightness and shortness of breath.   Neurological: Negative for dizziness.  Hematological: Does not bruise/bleed easily.    Allergies  Review of patient's allergies indicates no known allergies.  Home Medications   Current Outpatient Rx  Name  Route  Sig  Dispense  Refill  . diazepam (VALIUM) 5 MG tablet   Oral   Take 5 mg by mouth daily as needed for anxiety. Takes 1/2  tablet.         Marland Kitchen levothyroxine (SYNTHROID, LEVOTHROID) 75 MCG tablet   Oral   Take 75 mcg by mouth daily before breakfast.         . metoprolol tartrate (LOPRESSOR) 25 MG tablet   Oral   Take 6.25-12.5 mg by mouth 2 (two) times daily. 12.5 mg in the morning 6.25 mg at night          BP 162/81  Pulse 81  Temp(Src) 98.2 F (36.8 C) (Oral)  Resp 16  SpO2 100% Physical Exam  Nursing note and vitals reviewed. Constitutional: She appears well-developed.  HENT:  Head: Normocephalic.  Patient's right nare is packed.  Eyes: Pupils are equal, round, and reactive to light.  Neck: Neck supple.  Cardiovascular: Normal rate.     ED Course  Procedures (including critical care time) Labs Review Labs Reviewed  CBC WITH DIFFERENTIAL   Imaging Review No results found.  EKG Interpretation   None       MDM   1. Epistaxis, recurrent    Pt comes in with cc of nose bleed. S/p anterior packing. Dr. Erik Obey, ENT is in the ED, and so decided to come in and cauterize the bleed. Pt has an anterior bleed. Will d.c  Varney Biles, MD 10/25/13 [phone]

## 2013-11-25 ENCOUNTER — Ambulatory Visit: Payer: Medicare Other | Admitting: Internal Medicine

## 2013-12-07 ENCOUNTER — Encounter: Payer: Self-pay | Admitting: *Deleted

## 2013-12-16 ENCOUNTER — Ambulatory Visit (INDEPENDENT_AMBULATORY_CARE_PROVIDER_SITE_OTHER): Payer: Medicare Other | Admitting: Internal Medicine

## 2013-12-16 ENCOUNTER — Encounter: Payer: Self-pay | Admitting: Internal Medicine

## 2013-12-16 VITALS — BP 148/82 | HR 74 | Ht 62.5 in | Wt 122.8 lb

## 2013-12-16 DIAGNOSIS — I471 Supraventricular tachycardia: Secondary | ICD-10-CM

## 2013-12-16 DIAGNOSIS — I498 Other specified cardiac arrhythmias: Secondary | ICD-10-CM

## 2013-12-16 DIAGNOSIS — I10 Essential (primary) hypertension: Secondary | ICD-10-CM

## 2013-12-16 NOTE — Patient Instructions (Signed)
Your physician wants you to follow-up in: 6 months with Dr. Allred. You will receive a reminder letter in the mail two months in advance. If you don't receive a letter, please call our office to schedule the follow-up appointment.  

## 2013-12-16 NOTE — Progress Notes (Signed)
   PCP:  Tivis Ringer, MD  The patient presents today for routine electrophysiology followup.  Since last being seen in our clinic, the patient reports doing very well. She has rare and short lived palpitations. Today, she denies symptoms of chest pain, shortness of breath, orthopnea, PND, lower extremity edema, dizziness, presyncope, syncope, or neurologic sequela.  The patient feels that she is tolerating medications without difficulties and is otherwise without complaint today.   Past Medical History  Diagnosis Date  . Thyroid disease   . Osteoporosis   . Nephrolithiasis   . Hypoglycemia   . Chest pain   . Echocardiogram abnormal 2014    grade 2 diastolic dysfunction, EF 0000000, mild AR, mitral anular calcification with mild MR   . Paroxysmal SVT (supraventricular tachycardia) 2014   No past surgical history on file.  Current Outpatient Prescriptions  Medication Sig Dispense Refill  . diazepam (VALIUM) 5 MG tablet Take 5 mg by mouth daily as needed for anxiety. Takes 1/2 tablet.      Marland Kitchen levothyroxine (SYNTHROID, LEVOTHROID) 75 MCG tablet Take 75 mcg by mouth daily before breakfast.      . metoprolol tartrate (LOPRESSOR) 25 MG tablet Take 12.5 mg in the morning and take 6.25 mg at night       No current facility-administered medications for this visit.    No Known Allergies  History   Social History  . Marital Status: Married    Spouse Name: N/A    Number of Children: 1  . Years of Education: N/A   Occupational History  . Not on file.   Social History Main Topics  . Smoking status: Former Smoker    Types: Cigarettes    Quit date: 05/18/1969  . Smokeless tobacco: Never Used  . Alcohol Use: No  . Drug Use: No  . Sexual Activity: Not on file   Other Topics Concern  . Not on file   Social History Narrative   Lives in [place] with her spouse.  Retired    Family History  Problem Relation Age of Onset  . Breast cancer Mother   . Lung cancer Father      Physical Exam: Filed Vitals:   12/16/13 1436  BP: 148/82  Pulse: 74  Height: 5' 2.5" (1.588 m)  Weight: 122 lb 12.8 oz (55.702 kg)    GEN- The patient is well appearing, alert and oriented x 3 today.   Head- normocephalic, atraumatic Eyes-  Sclera clear, conjunctiva pink Ears- hearing intact Oropharynx- clear Neck- supple, no JVP Lymph- no cervical lymphadenopathy Lungs- Clear to ausculation bilaterally, normal work of breathing Heart- Regular rate and rhythm, no murmurs, rubs or gallops, PMI not laterally displaced GI- soft, NT, ND, + BS Extremities- no clubbing, cyanosis, or edema  ekg today reveals sinus rhythm  Assessment and Plan:  1. Svt Stable No change required today She continues to wish to avoid ablation  2. htn Stable No change required today  Return in 6 months

## 2014-05-26 ENCOUNTER — Encounter: Payer: Self-pay | Admitting: Internal Medicine

## 2014-05-26 ENCOUNTER — Ambulatory Visit (INDEPENDENT_AMBULATORY_CARE_PROVIDER_SITE_OTHER): Payer: Medicare Other | Admitting: Internal Medicine

## 2014-05-26 VITALS — BP 151/68 | HR 46 | Ht 62.0 in | Wt 123.2 lb

## 2014-05-26 DIAGNOSIS — I10 Essential (primary) hypertension: Secondary | ICD-10-CM

## 2014-05-26 DIAGNOSIS — I498 Other specified cardiac arrhythmias: Secondary | ICD-10-CM

## 2014-05-26 DIAGNOSIS — R0789 Other chest pain: Secondary | ICD-10-CM

## 2014-05-26 DIAGNOSIS — I471 Supraventricular tachycardia: Secondary | ICD-10-CM

## 2014-05-26 MED ORDER — NITROGLYCERIN 0.4 MG SL SUBL: 0.4000 mg | SUBLINGUAL_TABLET | SUBLINGUAL | Status: DC | PRN

## 2014-05-26 NOTE — Progress Notes (Signed)
PCP:  Tivis Ringer, MD  The patient presents today for routine electrophysiology followup.  Since last being seen in our clinic, the patient reports doing very well. She has rare and short lived palpitations (several seconds).  She denies exertional chest pain but has occasional fleeting pains at rest.  She also has occasional postural dizziness.  Her energy is preserved and she remains active for her age.  Today, she denies symptoms of shortness of breath, orthopnea, PND, lower extremity edema,  presyncope, syncope, or neurologic sequela.  The patient feels that she is tolerating medications without difficulties and is otherwise without complaint today.   Past Medical History  Diagnosis Date  . Thyroid disease   . Osteoporosis   . Nephrolithiasis   . Hypoglycemia   . Chest pain   . Echocardiogram abnormal 2014    grade 2 diastolic dysfunction, EF 0000000, mild AR, mitral anular calcification with mild MR   . Paroxysmal SVT (supraventricular tachycardia) 2014   No past surgical history on file.  Current Outpatient Prescriptions  Medication Sig Dispense Refill  . diazepam (VALIUM) 5 MG tablet Take 5 mg by mouth daily as needed for anxiety. Takes 1/2 tablet.      Marland Kitchen levothyroxine (SYNTHROID, LEVOTHROID) 75 MCG tablet Take 75 mcg by mouth daily before breakfast.      . metoprolol tartrate (LOPRESSOR) 25 MG tablet Take 12.5 mg in the morning and take 6.25 mg at night      . nitroGLYCERIN (NITROSTAT) 0.4 MG SL tablet Place 1 tablet (0.4 mg total) under the tongue every 5 (five) minutes as needed for chest pain.  90 tablet  3   No current facility-administered medications for this visit.    No Known Allergies  History   Social History  . Marital Status: Married    Spouse Name: N/A    Number of Children: 1  . Years of Education: N/A   Occupational History  . Not on file.   Social History Main Topics  . Smoking status: Former Smoker    Types: Cigarettes    Quit date:  05/18/1969  . Smokeless tobacco: Never Used  . Alcohol Use: No  . Drug Use: No  . Sexual Activity: Not on file   Other Topics Concern  . Not on file   Social History Narrative   Lives in [place] with her spouse.  Retired    Family History  Problem Relation Age of Onset  . Breast cancer Mother   . Lung cancer Father     Physical Exam: Filed Vitals:   05/26/14 1050  BP: 151/68  Pulse: 46  Height: 5\' 2"  (1.575 m)  Weight: 123 lb 3.2 oz (55.883 kg)    GEN- The patient is well appearing, alert and oriented x 3 today.   Head- normocephalic, atraumatic Eyes-  Sclera clear, conjunctiva pink Ears- hearing intact Oropharynx- clear Neck- supple, no JVP Lymph- no cervical lymphadenopathy Lungs- Clear to ausculation bilaterally, normal work of breathing Heart- Regular rate and rhythm, no murmurs, rubs or gallops, PMI not laterally displaced GI- soft, NT, ND, + BS Extremities- no clubbing, cyanosis, or edema  ekg today reveals sinus rhythm46 bpm, otherwise normal ekg  Assessment and Plan:  1. Svt Stable No change required today She continues to wish to avoid ablation  2. htn Stable (repeat by MD is 142/82),  She reports good BP control at home No change required today  3. Atypical chest pain She declines stress testing I think that her  symptoms are atypical She will contact me if she has further symptoms slNTG prescribed today  4. Asymptomatic sinus bradycardia No changes  Return to see me in 1 year She will contact me if problems arise in the interim.  Return in 6 months

## 2014-05-26 NOTE — Patient Instructions (Signed)
Your physician wants you to follow-up in: 12 months with Dr Allred You will receive a reminder letter in the mail two months in advance. If you don't receive a letter, please call our office to schedule the follow-up appointment.  

## 2014-06-07 ENCOUNTER — Telehealth: Payer: Self-pay | Admitting: Internal Medicine

## 2014-06-07 NOTE — Telephone Encounter (Signed)
Reports HR up to 148 today, 173/88 as same time. Took valium and 2 nitroglycerin and an extra 12.5 mg of metoprolol. Currently HR is 56,  BP 121/77. She is compliant with medications. This is the worse episode she's had in a long time. Usually takes 6.25 mg metoprolol around 6pm, instructed her to check bp and hr at bedtime and take the 6.25 mg at that time, as long as hr and bp are not too low. Verbalizes understanding.

## 2014-06-07 NOTE — Telephone Encounter (Signed)
New message     Patient calling C/O heart palpitation still going. Blood pressure just now 173/88, yesterday 131/40- pulse 46.

## 2014-06-12 NOTE — Telephone Encounter (Signed)
Brandi Chambers,  Please call patient to follow-up.  Hopefully she is doing better.

## 2014-06-14 NOTE — Telephone Encounter (Signed)
Feeling better and will call back if needed

## 2014-09-15 ENCOUNTER — Other Ambulatory Visit: Payer: Self-pay

## 2014-09-15 MED ORDER — METOPROLOL TARTRATE 25 MG PO TABS: ORAL_TABLET | ORAL | Status: DC

## 2015-06-08 ENCOUNTER — Other Ambulatory Visit: Payer: Self-pay

## 2015-06-08 MED ORDER — METOPROLOL TARTRATE 25 MG PO TABS: ORAL_TABLET | ORAL | Status: DC

## 2015-08-17 ENCOUNTER — Ambulatory Visit (INDEPENDENT_AMBULATORY_CARE_PROVIDER_SITE_OTHER): Payer: PPO | Admitting: Internal Medicine

## 2015-08-17 ENCOUNTER — Encounter: Payer: Self-pay | Admitting: Internal Medicine

## 2015-08-17 VITALS — BP 110/80 | HR 50 | Ht 62.5 in | Wt 121.8 lb

## 2015-08-17 DIAGNOSIS — I10 Essential (primary) hypertension: Secondary | ICD-10-CM

## 2015-08-17 DIAGNOSIS — R001 Bradycardia, unspecified: Secondary | ICD-10-CM | POA: Diagnosis not present

## 2015-08-17 DIAGNOSIS — I471 Supraventricular tachycardia, unspecified: Secondary | ICD-10-CM

## 2015-08-17 NOTE — Progress Notes (Signed)
PCP:  Tivis Ringer, MD  The patient presents today for routine electrophysiology followup.  Since last being seen in our clinic, the patient reports doing very well. She has rare and short lived palpitations.  She denies exertional chest pain but has occasional fleeting pains at rest which are unchanged over the past 2years.   Today, she denies symptoms of shortness of breath, orthopnea, PND, lower extremity edema,  presyncope, syncope, or neurologic sequela.  The patient feels that she is tolerating medications without difficulties and is otherwise without complaint today.   Past Medical History  Diagnosis Date  . Thyroid disease   . Osteoporosis   . Nephrolithiasis   . Hypoglycemia   . Chest pain   . Echocardiogram abnormal 2014    grade 2 diastolic dysfunction, EF 0000000, mild AR, mitral anular calcification with mild MR   . Paroxysmal SVT (supraventricular tachycardia) 2014   No past surgical history on file.  Current Outpatient Prescriptions  Medication Sig Dispense Refill  . diazepam (VALIUM) 5 MG tablet Take 2.5 mg by mouth daily as needed for anxiety.     Marland Kitchen levothyroxine (SYNTHROID, LEVOTHROID) 75 MCG tablet Take 75 mcg by mouth daily before breakfast.    . metoprolol tartrate (LOPRESSOR) 25 MG tablet Take 12.5 mg by mouth in the morning and take 6.25 mg by mouth at night    . nitroGLYCERIN (NITROSTAT) 0.4 MG SL tablet Place 1 tablet (0.4 mg total) under the tongue every 5 (five) minutes as needed for chest pain. 90 tablet 3   No current facility-administered medications for this visit.    No Known Allergies  Social History   Social History  . Marital Status: Married    Spouse Name: N/A  . Number of Children: 1  . Years of Education: N/A   Occupational History  . Not on file.   Social History Main Topics  . Smoking status: Former Smoker    Types: Cigarettes    Quit date: 05/18/1969  . Smokeless tobacco: Never Used  . Alcohol Use: No  . Drug Use: No  .  Sexual Activity: Not on file   Other Topics Concern  . Not on file   Social History Narrative   Lives in [place] with her spouse.  Retired    Family History  Problem Relation Age of Onset  . Breast cancer Mother   . Lung cancer Father     Physical Exam: Filed Vitals:   08/17/15 1128  BP: 110/80  Pulse: 50  Height: 5' 2.5" (1.588 m)  Weight: 55.248 kg (121 lb 12.8 oz)    GEN- The patient is well appearing, alert and oriented x 3 today.   Head- normocephalic, atraumatic Eyes-  Sclera clear, conjunctiva pink Ears- hearing intact Oropharynx- clear Neck- supple, no JVP Lymph- no cervical lymphadenopathy Lungs- Clear to ausculation bilaterally, normal work of breathing Heart- Regular rate and rhythm, no murmurs, rubs or gallops, PMI not laterally displaced GI- soft, NT, ND, + BS Extremities- no clubbing, cyanosis, or edema  ekg today reveals sinus rhythm 50 bpm, otherwise normal ekg  Assessment and Plan:  1. Svt Stable No change required today She continues to wish to avoid ablation  2. htn Stable   No change required today  3. Atypical chest pain She declines stress testing I think that her symptoms are atypical Given advanced age, a conservative management approach is planned  4. Asymptomatic sinus bradycardia No changes  Return to see me in 1 year She will  contact me if problems arise in the interim.

## 2015-08-17 NOTE — Patient Instructions (Signed)
Medication Instructions:  Your physician recommends that you continue on your current medications as directed. Please refer to the Current Medication list given to you today.   Labwork: None ordered  Testing/Procedures: None ordered  Follow-Up: Your physician wants you to follow-up in: 12 months with Dr [name] Dennis Bast will receive a reminder letter in the mail two months in advance. If you don't receive a letter, please call our office to schedule the follow-up appointment.   Any Other Special Instructions Will Be Listed Below (If Applicable).

## 2016-03-07 ENCOUNTER — Other Ambulatory Visit: Payer: Self-pay | Admitting: *Deleted

## 2016-03-07 MED ORDER — METOPROLOL TARTRATE 25 MG PO TABS: ORAL_TABLET | ORAL | Status: DC

## 2016-04-12 DIAGNOSIS — L570 Actinic keratosis: Secondary | ICD-10-CM | POA: Diagnosis not present

## 2016-04-12 DIAGNOSIS — D235 Other benign neoplasm of skin of trunk: Secondary | ICD-10-CM | POA: Diagnosis not present

## 2016-04-12 DIAGNOSIS — D1801 Hemangioma of skin and subcutaneous tissue: Secondary | ICD-10-CM | POA: Diagnosis not present

## 2016-04-12 DIAGNOSIS — L814 Other melanin hyperpigmentation: Secondary | ICD-10-CM | POA: Diagnosis not present

## 2016-06-21 DIAGNOSIS — J069 Acute upper respiratory infection, unspecified: Secondary | ICD-10-CM | POA: Diagnosis not present

## 2016-06-21 DIAGNOSIS — J029 Acute pharyngitis, unspecified: Secondary | ICD-10-CM | POA: Diagnosis not present

## 2016-09-06 ENCOUNTER — Encounter (HOSPITAL_COMMUNITY): Payer: Self-pay | Admitting: *Deleted

## 2016-09-06 ENCOUNTER — Emergency Department (HOSPITAL_COMMUNITY)
Admission: EM | Admit: 2016-09-06 | Discharge: 2016-09-06 | Disposition: A | Discharge status: Home / Self Care | Payer: PPO | Attending: Emergency Medicine | Admitting: Emergency Medicine

## 2016-09-06 DIAGNOSIS — IMO0001 Reserved for inherently not codable concepts without codable children: Secondary | ICD-10-CM

## 2016-09-06 DIAGNOSIS — E039 Hypothyroidism, unspecified: Secondary | ICD-10-CM | POA: Insufficient documentation

## 2016-09-06 DIAGNOSIS — Z87891 Personal history of nicotine dependence: Secondary | ICD-10-CM | POA: Diagnosis not present

## 2016-09-06 DIAGNOSIS — I10 Essential (primary) hypertension: Secondary | ICD-10-CM | POA: Diagnosis not present

## 2016-09-06 DIAGNOSIS — I161 Hypertensive emergency: Secondary | ICD-10-CM | POA: Diagnosis not present

## 2016-09-06 DIAGNOSIS — R030 Elevated blood-pressure reading, without diagnosis of hypertension: Secondary | ICD-10-CM

## 2016-09-06 HISTORY — DX: Essential (primary) hypertension: I10

## 2016-09-06 LAB — BASIC METABOLIC PANEL
Anion gap: 11 (ref 5–15)
BUN: 16 mg/dL (ref 6–20)
CO2: 23 mmol/L (ref 22–32)
Calcium: 9.6 mg/dL (ref 8.9–10.3)
Chloride: 101 mmol/L (ref 101–111)
Creatinine, Ser: 0.95 mg/dL (ref 0.44–1.00)
GFR calc Af Amer: >60 mL/min (ref >60)
GFR calc non Af Amer: 54 mL/min — ABNORMAL LOW (ref >60)
Glucose, Bld: 107 mg/dL — ABNORMAL HIGH (ref 65–99)
Potassium: 4.8 mmol/L (ref 3.5–5.1)
Sodium: 135 mmol/L (ref 135–145)

## 2016-09-06 LAB — URINE MICROSCOPIC-ADD ON
Bacteria, UA: NONE SEEN
RBC / HPF: NONE SEEN RBC/hpf (ref 0–5)

## 2016-09-06 LAB — CBC
HCT: 43.2 % (ref 36.0–46.0)
Hemoglobin: 14.2 g/dL (ref 12.0–15.0)
MCH: 28.7 pg (ref 26.0–34.0)
MCHC: 32.9 g/dL (ref 30.0–36.0)
MCV: 87.3 fL (ref 78.0–100.0)
Platelets: 225 10*3/uL (ref 150–400)
RBC: 4.95 MIL/uL (ref 3.87–5.11)
RDW: 13.8 % (ref 11.5–15.5)
WBC: 9.2 10*3/uL (ref 4.0–10.5)

## 2016-09-06 LAB — URINALYSIS, ROUTINE W REFLEX MICROSCOPIC
Bilirubin Urine: NEGATIVE
Glucose, UA: NEGATIVE mg/dL
Ketones, ur: NEGATIVE mg/dL
Nitrite: NEGATIVE
Protein, ur: NEGATIVE mg/dL
Specific Gravity, Urine: 1.006 (ref 1.005–1.030)
pH: 7 (ref 5.0–8.0)

## 2016-09-06 NOTE — Discharge Instructions (Signed)
Return to the ED with any concerns including chest pain, difficulty breathing, fainting, weakness of arms or legs, changes in visino or speech, decreased level of alertness/lethargy, or any other alarming symptoms

## 2016-09-06 NOTE — ED Triage Notes (Signed)
Pt reports headache since yesterday afternoon, having problems recently with HTN. Has mild headache at this time. Reports generalized fatigue, no neuro deficits noted at triage. Hx of HTN and reports taking all meds as prescribed. BP 226/102 at triage.

## 2016-09-06 NOTE — ED Provider Notes (Signed)
[facility] DEPT Provider Note   CSN: [id number] Arrival date & time: 09/06/16  1133     History   Chief Complaint Chief Complaint  Patient presents with  . Headache  . Hypertension    HPI Brandi Chambers is a 82 y.o. female.  HPI  Pt with hx of hypertension presents with concern for elevated blood pressure.  She states she went to bed last night with a headache- when she woke up approx 3am with ongoing headache she checked her BP and it continued to be high.  BP was high when she checked it multiple times during the day today.  Her headache improved.  No changes in vision or speech.  No weakness of arms or legs.  No chest pain no difficulty breathing.  She states the took an extra dose of her metoprolol (normally takes 1/2 tab) when her systolic BP was elevated above 200.   She states her dose was decreased to 1/2 tab approx 2 years ago because her BP was running too low.  Currently has mild frontal headache.  There are no other associated systemic symptoms, there are no other alleviating or modifying factors.   Past Medical History:  Diagnosis Date  . Chest pain   . Echocardiogram abnormal 2014   grade 2 diastolic dysfunction, EF 57-26%, mild AR, mitral anular calcification with mild MR   . Hypertension   . Hypoglycemia   . Nephrolithiasis   . Osteoporosis   . Paroxysmal SVT (supraventricular tachycardia) ([place]) 2014  . Thyroid disease     Patient Active Problem List   Diagnosis Date Noted  . Epistaxis 10/25/2013  . Hypertension 10/25/2013  . Symptomatic bradycardia 08/05/2013  . Chest pressure 08/05/2013  . SVT (supraventricular tachycardia) ([place]) 07/26/2013  . Heart palpitations 05/18/2013  . Essential hypertension 05/18/2013  . Hypothyroid 05/18/2013  . Weakness 05/18/2013    History reviewed. No pertinent surgical history.  OB History    No data available       Home Medications    Prior to Admission medications   Medication Sig Start Date End Date  Taking? Authorizing Provider  diazepam (VALIUM) 5 MG tablet Take 2.5 mg by mouth daily as needed for anxiety.     Historical Provider, MD  levothyroxine (SYNTHROID, LEVOTHROID) 75 MCG tablet Take 75 mcg by mouth daily before breakfast.    Historical Provider, MD  metoprolol tartrate (LOPRESSOR) 25 MG tablet Take 12.5 mg by mouth in the morning and take 6.25 mg by mouth at night 03/07/16   [name], MD  nitroGLYCERIN (NITROSTAT) 0.4 MG SL tablet Place 1 tablet (0.4 mg total) under the tongue every 5 (five) minutes as needed for chest pain. 05/26/14   [name], MD    Family History Family History  Problem Relation Age of Onset  . Breast cancer Mother   . Lung cancer Father     Social History Social History  Substance Use Topics  . Smoking status: Former Smoker    Types: Cigarettes    Quit date: 05/18/1969  . Smokeless tobacco: Never Used  . Alcohol use No     Allergies   Review of patient's allergies indicates no known allergies.   Review of Systems Review of Systems  ROS reviewed and all otherwise negative except for mentioned in HPI   Physical Exam Updated Vital Signs BP 187/80   Pulse 78   Temp 97.8 F (36.6 C) (Oral)   Resp 23   Ht 5' 2.5" (1.588 m)  Wt 54.6 kg   SpO2 99%   BMI 21.67 kg/m  Vitals reviewed Physical Exam Physical Examination: General appearance - alert, well appearing, and in no distress Mental status - alert, oriented to person, place, and time Eyes - no conjunctival injection, no scleral icterus Mouth - mucous membranes moist, pharynx normal without lesions Neck - supple, no significant adenopathy Chest - clear to auscultation, no wheezes, rales or rhonchi, symmetric air entry Heart - normal rate, regular rhythm, normal S1, S2, no murmurs, rubs, clicks or gallops Abdomen - soft, nontender, nondistended, no masses or organomegaly Neurological - alert, oriented x 3, cranial nerves tested and intact, strength 5/5 in extremities x 4,  sensation intact Extremities - peripheral pulses normal, no pedal edema, no clubbing or cyanosis Skin - normal coloration and turgor, no rashes  ED Treatments / Results  Labs (all labs ordered are listed, but only abnormal results are displayed) Labs Reviewed  BASIC METABOLIC PANEL - Abnormal; Notable for the following:       Result Value   Glucose, Bld 107 (*)    GFR calc non Af Amer 54 (*)    All other components within normal limits  URINALYSIS, ROUTINE W REFLEX MICROSCOPIC (NOT AT [facility]) - Abnormal; Notable for the following:    Hgb urine dipstick SMALL (*)    Leukocytes, UA TRACE (*)    All other components within normal limits  URINE MICROSCOPIC-ADD ON - Abnormal; Notable for the following:    Squamous Epithelial / LPF 0-5 (*)    All other components within normal limits  CBC    EKG  EKG Interpretation  Date/Time:  Thursday September 06 2016 11:50:27 EDT Ventricular Rate:  52 PR Interval:  160 QRS Duration: 82 QT Interval:  438 QTC Calculation: 407 R Axis:   87 Text Interpretation:  Sinus bradycardia with sinus arrhythmia Otherwise normal ECG No old tracing to compare Confirmed by [facility]  MD, Henryk Ursin [id number]) on 09/06/2016 4:13:57 PM       Radiology No results found.  Procedures Procedures (including critical care time)  Medications Ordered in ED Medications - No data to display   Initial Impression / Assessment and Plan / ED Course  I have reviewed the triage vital signs and the nursing notes.  Pertinent labs & imaging results that were available during my care of the patient were reviewed by me and considered in my medical decision making (see chart for details).  Clinical Course    Pt presenting with concern for mild headache- she has normal neuro exam.  No signs of end organ damage on workup.  D/w patient to continue taking her meds as prescribed and arrange for blood pressure recheck with her PMD.  Discharged with strict return precautions.  Pt agreeable  with plan.  Final Clinical Impressions(s) / ED Diagnoses   Final diagnoses:  Elevated blood pressure    New Prescriptions Discharge Medication List as of 09/06/2016  5:10 PM       Alfonzo Beers, MD 09/06/16 2201

## 2016-09-07 ENCOUNTER — Telehealth: Payer: Self-pay | Admitting: Internal Medicine

## 2016-09-07 NOTE — Telephone Encounter (Signed)
Pt states that she has not felt well for a few weeks now.  Complains of elevated BP and weakness.  Went to ER yesterday for elevated BP and HA.  Discharged with no med changes.  Pt says average BP at home is 170's/85-100.  Denies visual disturbances, numbness, CP or SOB.  Pt takes Metoprolol Tartrate 12.5mg  in AM and 6.25mg  in PM.  Pt has taken 12.5mg  BID for the last two days.  Pt says Metoprolol is only bringing the BP down just slightly.  Advised I will send message to Dr. [name] for review and advisement but if BP gets high again like it was yesterday to go back to ER.  Pt verbalized understanding and was in agreement with this plan.

## 2016-09-07 NOTE — Telephone Encounter (Signed)
Pt would like a call back about her medications she does not feel as though it is working.   Pt made and appt for 10/17/16

## 2016-09-07 NOTE — Telephone Encounter (Signed)
Please do not encourage ER visits for routine blood pressure management.  ED physician instructed patient to follow-up with primary care physician which is appropriate. Would continue current dose of metoprolol. Could add norvasc 5mg  daily. Advise patient to follow-up with Dr Dagmar Hait.  Can also see general cardiology APP if patient is unable to be seen by Dr Dagmar Hait in the next few days.

## 2016-09-10 NOTE — Telephone Encounter (Signed)
Phone busy x 2 this morning.

## 2016-09-11 NOTE — Telephone Encounter (Signed)
Spoke with patient and have asked her to follow up with PCP.  She has an appointment on 10/28.  She says her BP are better now running 180/85 on Sat Today 157/63.  She will call Dr Danna Hefty office if it starts to fluctuate higher.

## 2016-10-04 ENCOUNTER — Encounter: Payer: Self-pay | Admitting: Internal Medicine

## 2016-10-04 DIAGNOSIS — M859 Disorder of bone density and structure, unspecified: Secondary | ICD-10-CM | POA: Diagnosis not present

## 2016-10-04 DIAGNOSIS — Z0000 Encounter for general adult medical examination without abnormal findings: Secondary | ICD-10-CM | POA: Diagnosis not present

## 2016-10-04 DIAGNOSIS — E038 Other specified hypothyroidism: Secondary | ICD-10-CM | POA: Diagnosis not present

## 2016-10-04 DIAGNOSIS — E784 Other hyperlipidemia: Secondary | ICD-10-CM | POA: Diagnosis not present

## 2016-10-09 DIAGNOSIS — Z0131 Encounter for examination of blood pressure with abnormal findings: Secondary | ICD-10-CM | POA: Diagnosis not present

## 2016-10-09 DIAGNOSIS — R195 Other fecal abnormalities: Secondary | ICD-10-CM | POA: Diagnosis not present

## 2016-10-09 DIAGNOSIS — I456 Pre-excitation syndrome: Secondary | ICD-10-CM | POA: Diagnosis not present

## 2016-10-09 DIAGNOSIS — Z0000 Encounter for general adult medical examination without abnormal findings: Secondary | ICD-10-CM | POA: Diagnosis not present

## 2016-10-09 DIAGNOSIS — E784 Other hyperlipidemia: Secondary | ICD-10-CM | POA: Diagnosis not present

## 2016-10-09 DIAGNOSIS — D692 Other nonthrombocytopenic purpura: Secondary | ICD-10-CM | POA: Diagnosis not present

## 2016-10-09 DIAGNOSIS — M810 Age-related osteoporosis without current pathological fracture: Secondary | ICD-10-CM | POA: Diagnosis not present

## 2016-10-09 DIAGNOSIS — E038 Other specified hypothyroidism: Secondary | ICD-10-CM | POA: Diagnosis not present

## 2016-10-09 DIAGNOSIS — Z1389 Encounter for screening for other disorder: Secondary | ICD-10-CM | POA: Diagnosis not present

## 2016-10-09 DIAGNOSIS — J302 Other seasonal allergic rhinitis: Secondary | ICD-10-CM | POA: Diagnosis not present

## 2016-10-17 ENCOUNTER — Encounter: Payer: Self-pay | Admitting: Internal Medicine

## 2016-10-17 ENCOUNTER — Ambulatory Visit (INDEPENDENT_AMBULATORY_CARE_PROVIDER_SITE_OTHER): Payer: PPO | Admitting: Internal Medicine

## 2016-10-17 VITALS — BP 170/92 | HR 49 | Ht 62.0 in | Wt 121.0 lb

## 2016-10-17 DIAGNOSIS — I10 Essential (primary) hypertension: Secondary | ICD-10-CM

## 2016-10-17 DIAGNOSIS — I471 Supraventricular tachycardia: Secondary | ICD-10-CM

## 2016-10-17 DIAGNOSIS — R001 Bradycardia, unspecified: Secondary | ICD-10-CM | POA: Diagnosis not present

## 2016-10-17 MED ORDER — LOSARTAN POTASSIUM 25 MG PO TABS: 25.0000 mg | ORAL_TABLET | Freq: Every day | ORAL | 3 refills | Status: DC

## 2016-10-17 MED ORDER — METOPROLOL TARTRATE 25 MG PO TABS: ORAL_TABLET | ORAL | 3 refills | Status: DC

## 2016-10-17 NOTE — Progress Notes (Signed)
PCP:  Tivis Ringer, MD  The patient presents today for routine electrophysiology followup.  Since last being seen in our clinic, the patient reports doing very well.  Palpitations are well controlled.  Asymptomatic sinus bradycardia.  Her concern today is with recently elevated BP.   Today, she denies symptoms of shortness of breath, orthopnea, PND, lower extremity edema,  presyncope, syncope, or neurologic sequela.  The patient feels that she is tolerating medications without difficulties and is otherwise without complaint today.   Past Medical History:  Diagnosis Date  . Chest pain   . Echocardiogram abnormal 2014   grade 2 diastolic dysfunction, EF 62-69%, mild AR, mitral anular calcification with mild MR   . Hypertension   . Hypoglycemia   . Nephrolithiasis   . Osteoporosis   . Paroxysmal SVT (supraventricular tachycardia) ([place]) 2014  . Thyroid disease    No past surgical history on file.  Current Outpatient Prescriptions  Medication Sig Dispense Refill  . levothyroxine (SYNTHROID, LEVOTHROID) 75 MCG tablet Take 75 mcg by mouth daily before breakfast.    . nitroGLYCERIN (NITROSTAT) 0.4 MG SL tablet Place 1 tablet (0.4 mg total) under the tongue every 5 (five) minutes as needed for chest pain. 90 tablet 3  . diazepam (VALIUM) 5 MG tablet Take 2.5 mg by mouth daily as needed for anxiety.     Marland Kitchen losartan (COZAAR) 25 MG tablet Take 1 tablet (25 mg total) by mouth daily. 90 tablet 3  . metoprolol tartrate (LOPRESSOR) 25 MG tablet Take 12.5 mg by mouth in the morning and take 6.25 mg by mouth at night 70 tablet 3   No current facility-administered medications for this visit.     No Known Allergies  Social History   Social History  . Marital status: Married    Spouse name: N/A  . Number of children: 1  . Years of education: N/A   Occupational History  . Not on file.   Social History Main Topics  . Smoking status: Former Smoker    Types: Cigarettes    Quit date:  05/18/1969  . Smokeless tobacco: Never Used  . Alcohol use No  . Drug use: No  . Sexual activity: Not on file   Other Topics Concern  . Not on file   Social History Narrative   Lives in [place] with her spouse.  Retired    Family History  Problem Relation Age of Onset  . Breast cancer Mother   . Lung cancer Father     Physical Exam: Vitals:   10/17/16 1541  BP: (!) 170/92  Pulse: (!) 49  Weight: 121 lb (54.9 kg)  Height: 5\' 2"  (1.575 m)    GEN- The patient is well appearing, alert and oriented x 3 today.   Head- normocephalic, atraumatic Eyes-  Sclera clear, conjunctiva pink Ears- hearing intact Oropharynx- clear Neck- supple, no JVP Lymph- no cervical lymphadenopathy Lungs- Clear to ausculation bilaterally, normal work of breathing Heart- Regular rate and rhythm, no murmurs, rubs or gallops, PMI not laterally displaced GI- soft, NT, ND, + BS Extremities- no clubbing, cyanosis, or edema  ekg today reveals sinus rhythm 49 bpm, otherwise normal ekg  Assessment and Plan:  1. Svt Stable No change required today She continues to wish to avoid ablation  2. htn Elevated Will add losartan 25mg  daily Return for bp check and bmet in 2 weeks Follow-up with PCP  3. Asymptomatic bradycardia No indication for pacing, no further workup planned  Follow-up with EP  PA-C in 6 weeks and then again in3- 6 months Return to see me in 1 year She will contact me if problems arise in the interim.   [name] MD, [facility] 10/17/2016 9:12 PM

## 2016-10-17 NOTE — Patient Instructions (Signed)
Medication Instructions:  Your physician has recommended you make the following change in your medication:  1) Start Losartan 25 mg daily   Labwork: Your physician recommends that you return for lab work in: 2 weeks with nurse room visit, needs BMP .   Testing/Procedures: None ordered   Follow-Up: Your physician recommends that you schedule a follow-up appointment in: 2 weeks with nurse room for BP check and labs.  Follow up in 6 weeks with Tommye Standard, PA    Any Other Special Instructions Will Be Listed Below (If Applicable).     If you need a refill on your cardiac medications before your next appointment, please call your pharmacy.

## 2016-10-25 DIAGNOSIS — H04123 Dry eye syndrome of bilateral lacrimal glands: Secondary | ICD-10-CM | POA: Diagnosis not present

## 2016-10-25 DIAGNOSIS — H40053 Ocular hypertension, bilateral: Secondary | ICD-10-CM | POA: Diagnosis not present

## 2016-10-25 DIAGNOSIS — H25813 Combined forms of age-related cataract, bilateral: Secondary | ICD-10-CM | POA: Diagnosis not present

## 2016-11-01 ENCOUNTER — Ambulatory Visit: Payer: PPO | Admitting: *Deleted

## 2016-11-01 ENCOUNTER — Encounter (INDEPENDENT_AMBULATORY_CARE_PROVIDER_SITE_OTHER): Payer: Self-pay

## 2016-11-01 ENCOUNTER — Other Ambulatory Visit: Payer: PPO | Admitting: *Deleted

## 2016-11-01 VITALS — BP 156/82 | HR 58 | Ht 62.0 in | Wt 120.1 lb

## 2016-11-01 DIAGNOSIS — I10 Essential (primary) hypertension: Secondary | ICD-10-CM

## 2016-11-01 LAB — BASIC METABOLIC PANEL
BUN: 24 mg/dL (ref 7–25)
CO2: 27 mmol/L (ref 20–31)
Calcium: 9.3 mg/dL (ref 8.6–10.4)
Chloride: 102 mmol/L (ref 98–110)
Creat: 0.98 mg/dL — ABNORMAL HIGH (ref 0.60–0.88)
Glucose, Bld: 69 mg/dL (ref 65–99)
Potassium: 4.5 mmol/L (ref 3.5–5.3)
Sodium: 137 mmol/L (ref 135–146)

## 2016-11-01 NOTE — Progress Notes (Signed)
Pt came in today for BP check and lab after being started on Losartan 25 mg a day.  She reports taking 25 mg in the AM for 2 days.  After the 2nd day her BP was 106/43 and she felt lightheaded and dizzy.  She then decided to start on the 3rd AM taking 1/2 tablet.  Per her report her BP has been running 140's/70's at home during the day but is always elevated in the morning when she first gets up. (186/84 this AM).  Today during her check her BP was 156/82.  Advised pt this BP is elevated and needs to be under better control.  Advised to take 1/2 tablet tonight and then a whole tablet on the 2nd night and thereafter.  She will continue to monitor BP and let us know if she develops any dizziness or if the blood pressure doesn't become better controlled.  She will follow up as scheduled.

## 2016-11-16 ENCOUNTER — Encounter: Payer: Self-pay | Admitting: Physician Assistant

## 2016-11-28 ENCOUNTER — Ambulatory Visit: Payer: PPO | Admitting: Physician Assistant

## 2016-12-06 ENCOUNTER — Encounter: Payer: Self-pay | Admitting: Physician Assistant

## 2016-12-24 ENCOUNTER — Ambulatory Visit: Payer: PPO | Admitting: Physician Assistant

## 2016-12-24 NOTE — Progress Notes (Signed)
PCP:  Tivis Ringer, MD Electrophysiologist: Dr. [name]   CC: planned f/u  Brandi Chambers is a 83 y.o. female with PMHx of SVT, HTN, hypothyroidism and asymptomatic bradycardia comes in today to be seen for Dr. [name], last seen by him in Nov, at that time started on losartan for her BP with planned f/u today.  She is feeling well, has had only one day in the last several months with palpitations that quickly self resolved.  No CP or SOB, no near syncope or syncope.  She is tolerating the losartabn without side effects but report very erratic readings at home, was directed to take 1/2 tabe with reports of SBP 90's feeling sluggish, since then she is taking 1/2 tab at nigh a 1/4tab in AM and the other 1/4 tab if her BP is >160.  She remains on the same metoprolol dosing regime for quite a while with good control of her palpitations and reports generally if HR 50's.    Past Medical History:  Diagnosis Date  . Chest pain   . Echocardiogram abnormal 2014   grade 2 diastolic dysfunction, EF 37-62%, mild AR, mitral anular calcification with mild MR   . Hypertension   . Hypoglycemia   . Nephrolithiasis   . Osteoporosis   . Paroxysmal SVT (supraventricular tachycardia) ([place]) 2014  . Thyroid disease    No past surgical history on file.  Current Outpatient Prescriptions  Medication Sig Dispense Refill  . diazepam (VALIUM) 5 MG tablet Take 2.5 mg by mouth daily as needed for anxiety.     Marland Kitchen levothyroxine (SYNTHROID, LEVOTHROID) 75 MCG tablet Take 75 mcg by mouth daily before breakfast.    . metoprolol tartrate (LOPRESSOR) 25 MG tablet Take 12.5 mg by mouth in the morning and take 6.25 mg by mouth at night (Patient taking differently: Take 12.5 mg by mouth in the morning and QUARTER TABLET IN PM) 70 tablet 3  . nitroGLYCERIN (NITROSTAT) 0.4 MG SL tablet Place 1 tablet (0.4 mg total) under the tongue every 5 (five) minutes as needed for chest pain. 90 tablet 3  . lisinopril (PRINIVIL,ZESTRIL)  5 MG tablet Take 1 tablet (5 mg total) by mouth daily. 30 tablet 5   No current facility-administered medications for this visit.     No Known Allergies  Social History   Social History  . Marital status: Married    Spouse name: N/A  . Number of children: 1  . Years of education: N/A   Occupational History  . Not on file.   Social History Main Topics  . Smoking status: Former Smoker    Types: Cigarettes    Quit date: 05/18/1969  . Smokeless tobacco: Never Used  . Alcohol use No  . Drug use: No  . Sexual activity: Not on file   Other Topics Concern  . Not on file   Social History Narrative   Lives in [place] with her spouse.  Retired    Family History  Problem Relation Age of Onset  . Breast cancer Mother   . Lung cancer Father     Physical Exam: Vitals:   12/25/16 0956  BP: (!) 150/94  Pulse: (!) 59  Weight: 122 lb (55.3 kg)  Height: 5\' 2"  (1.575 m)    GEN- The patient is well appearing, alert and oriented x 3 today.   Head- normocephalic, atraumatic Eyes-  Sclera clear, conjunctiva pink Ears- hearing intact Oropharynx- clear Neck- supple, no JVP Lymph- no cervical lymphadenopathy Lungs- Clear  to ausculation bilaterally, normal work of breathing Heart- RRR, no murmurs, rubs or gallops, PMI not laterally displaced GI- soft, NT, ND Extremities- no clubbing, cyanosis, no edema   EKG 10/17/16 was  sinus rhythm 49 bpm, otherwise normal ekg 05/26/13: TTE Study Conclusions - Left ventricle: The cavity size was normal. Wall thickness was normal. Systolic function was normal. The estimated ejection fraction was in the range of 55% to 60%. Wall motion was normal; there were no regional wall motion abnormalities. Features are consistent with a pseudonormal left ventricular filling pattern, with concomitant abnormal relaxation and increased filling pressure (grade 2 diastolic dysfunction). Doppler parameters are consistent with elevated mean  left atrial filling pressure. - Aortic valve: Mildly thickened leaflets. Sclerosis without stenosis. Mild regurgitation. - Mitral valve: Calcified annulus. Mildly thickened leaflets . Mild regurgitation. Valve area by pressure half-time: 1.93cm^2. - Tricuspid valve: Mild-moderate regurgitation. - Pericardium, extracardiac: A small pericardial effusion (0.845 cm) was identified posterior to the heart.  Assessment and Plan:  1. SVT Stable No change required today She continues to wish to avoid ablation  2. HTN Reported low end at home SBP 90's and high end 170, generally 110-140/50-70 though cutting the tab in 1/2 and 1/4 tab depending on her BP  Need to simplify her regime (leaving her metoprolol regime unchanged given is doing well controlling her SVT) Will stop the losartan, start lisinopril 5mg  daily.  She will keep daily BP log bringing it and her cuff to a BP clinic/RN visit in 1 month.  And revisist with me in 3 months, sooner if needed. Should she have consisintantly BP >160/90 or < 100/50 she will notify us.  3. Bradycardia asymptomatic No indication for pacing, no further workup planned   Tommye Standard, PA-C 12/25/2016 10:39 AM

## 2016-12-25 ENCOUNTER — Ambulatory Visit (INDEPENDENT_AMBULATORY_CARE_PROVIDER_SITE_OTHER): Payer: PPO | Admitting: Physician Assistant

## 2016-12-25 VITALS — BP 150/94 | HR 59 | Ht 62.0 in | Wt 122.0 lb

## 2016-12-25 DIAGNOSIS — I10 Essential (primary) hypertension: Secondary | ICD-10-CM | POA: Diagnosis not present

## 2016-12-25 DIAGNOSIS — I471 Supraventricular tachycardia: Secondary | ICD-10-CM | POA: Diagnosis not present

## 2016-12-25 DIAGNOSIS — R001 Bradycardia, unspecified: Secondary | ICD-10-CM

## 2016-12-25 MED ORDER — LISINOPRIL 5 MG PO TABS: 5.0000 mg | ORAL_TABLET | Freq: Every day | ORAL | 5 refills | Status: DC

## 2016-12-25 NOTE — Patient Instructions (Addendum)
Medication Instructions:   STOP TAKING LOSARTAN  25 MG   START TAKING LISINOPRIL 5 MG ONCE A DAY   If you need a refill on your cardiac medications before your next appointment, please call your pharmacy.  Labwork: NONE ORDERED  TODAY    Testing/Procedures: NONE ORDERED  TODAY    Follow-Up: BLOOD PRESSURE CLINIC WITH PHARM D IN ONE MONTH         URSUY IN 3 MONTHS    Any Other Special Instructions Will Be Listed Below (If Applicable).

## 2017-01-02 ENCOUNTER — Ambulatory Visit: Payer: PPO

## 2017-01-09 ENCOUNTER — Ambulatory Visit: Payer: PPO

## 2017-01-15 ENCOUNTER — Ambulatory Visit: Payer: PPO

## 2017-01-22 ENCOUNTER — Encounter: Payer: Self-pay | Admitting: Pharmacist

## 2017-01-22 ENCOUNTER — Ambulatory Visit (INDEPENDENT_AMBULATORY_CARE_PROVIDER_SITE_OTHER): Payer: PPO | Admitting: Pharmacist

## 2017-01-22 VITALS — BP 146/84 | HR 57 | Wt 119.5 lb

## 2017-01-22 DIAGNOSIS — I10 Essential (primary) hypertension: Secondary | ICD-10-CM | POA: Diagnosis not present

## 2017-01-22 NOTE — Progress Notes (Signed)
Patient ID: Brandi Chambers                 DOB: [birth year]                      MRN: [id number]     HPI: Brandi Chambers is a 83 y.o. female patient of Dr. [name], referred by Tommye Standard, PA with PMH below who presents today for hypertension evaluation. At her most recent visit her losartan was changed to lisinopril 5mg . It was recommended to keep her pressure between 160-100/90-50.   She presents today and states that she forgot her blood pressure cuff and measurements. She states she feels she has done well with her recent changes and her pressure have improved.   She had a fall about 3-4 weeks ago while in bathroom. Patient is unsure what caused her to fall but she did not feel dizzy prior to fall.   Cardiac Hx: SVT, HTN, hypothyroidism and asymptomatic bradycardia  Current HTN meds:  Metoprolol tartrate 12.5mg  in the morning and 6.25mg  in the evening Lisinopril 5mg  daily  Previously tried:  Losartan - was causing stomach problems.   BP goal: see above  Family History: No family history of HTN.   Social History: Quit smoking in 1969. Denies alcohol.  Diet: Most meals from home and does not add salt at the table. Endorses 1- 2 cups of coffee per day. She usually drinks tea for the rest of the day. She has cut back on caffeine over the last few weeks.   Exercise: Walks on the treadmill every morning, but patient did have a fall about 3-4 weeks ago so she has slowly getting back into walking.   Home BP readings: patient reports most of her measurements are <440 systolic and 10-27O diastolic.   Wt Readings from Last 3 Encounters:  01/22/17 119 lb 8 oz (54.2 kg)  12/25/16 122 lb (55.3 kg)  11/01/16 120 lb 2 oz (54.5 kg)   BP Readings from Last 3 Encounters:  01/22/17 (!) 146/84  12/25/16 (!) 150/94  11/01/16 (!) 156/82   Pulse Readings from Last 3 Encounters:  01/22/17 (!) 57  12/25/16 (!) 59  11/01/16 (!) 58    Renal function: CrCl cannot be calculated (Patient's most  recent lab result is older than the maximum 21 days allowed.).  Past Medical History:  Diagnosis Date  . Chest pain   . Echocardiogram abnormal 2014   grade 2 diastolic dysfunction, EF 53-66%, mild AR, mitral anular calcification with mild MR   . Hypertension   . Hypoglycemia   . Nephrolithiasis   . Osteoporosis   . Paroxysmal SVT (supraventricular tachycardia) ([place]) 2014  . Thyroid disease     Current Outpatient Prescriptions on File Prior to Visit  Medication Sig Dispense Refill  . diazepam (VALIUM) 5 MG tablet Take 2.5 mg by mouth daily as needed for anxiety.     Marland Kitchen levothyroxine (SYNTHROID, LEVOTHROID) 75 MCG tablet Take 75 mcg by mouth daily before breakfast.    . lisinopril (PRINIVIL,ZESTRIL) 5 MG tablet Take 1 tablet (5 mg total) by mouth daily. 30 tablet 5  . metoprolol tartrate (LOPRESSOR) 25 MG tablet Take 12.5 mg by mouth in the morning and take 6.25 mg by mouth at night (Patient taking differently: Take 12.5 mg by mouth in the morning and QUARTER TABLET IN PM) 70 tablet 3  . nitroGLYCERIN (NITROSTAT) 0.4 MG SL tablet Place 1 tablet (0.4 mg total) under the tongue  every 5 (five) minutes as needed for chest pain. (Patient not taking: Reported on 01/22/2017) 90 tablet 3   No current facility-administered medications on file prior to visit.     No Known Allergies  Blood pressure (!) 146/84, pulse (!) 57, weight 119 lb 8 oz (54.2 kg).   Assessment/Plan: Hypertension: BP today within range recommended by Tommye Standard, PA.  Will defer increase in lisinopril due to dizziness and preference of pressure to run slightly higher given age and history of fall. Will also plan to leave metoprolol regimen as prescribed as it is doing well to control SVT. Patient to continue to monitor and bring cuff and log to follow up appt as scheduled. Follow up with HTN clinic as needed.     Thank you, Lelan Pons. Patterson Hammersmith, [facility]  01/22/2017 12:43 PM

## 2017-01-22 NOTE — Patient Instructions (Signed)
Return for a follow up appointment as schedule with Brandi Standard, PA  Check your blood pressure at home daily (if able) and keep record of the readings.  Take your BP meds as follows: Continue all medications as prescribed.   Bring all of your meds, your BP cuff and your record of home blood pressures to your next appointment.  Exercise as you're able, try to walk approximately 30 minutes per day.  Keep salt intake to a minimum, especially watch canned and prepared boxed foods.  Eat more fresh fruits and vegetables and fewer canned items.  Avoid eating in fast food restaurants.    HOW TO TAKE YOUR BLOOD PRESSURE: . Rest 5 minutes before taking your blood pressure. .  Don't smoke or drink caffeinated beverages for at least 30 minutes before. . Take your blood pressure before (not after) you eat. . Sit comfortably with your back supported and both feet on the floor (don't cross your legs). . Elevate your arm to heart level on a table or a desk. . Use the proper sized cuff. It should fit smoothly and snugly around your bare upper arm. There should be enough room to slip a fingertip under the cuff. The bottom edge of the cuff should be 1 inch above the crease of the elbow. . Ideally, take 3 measurements at one sitting and record the average.

## 2017-03-24 NOTE — Progress Notes (Signed)
PCP:  Tivis Ringer, MD Electrophysiologist: Dr. [name]   CC: planned f/u  Brandi Chambers is a 83 y.o. female with PMHx of SVT, HTN, hypothyroidism and asymptomatic bradycardia comes in today to be seen for Dr. [name], last seen by him in Nov, at that time started on losartan for her BP , she had f/u with me in January with reports of widely swinging BP readings and her medicines further adjusted.  She had f/u with [facility] BP was 146/84, no further changes were made.   She continues to feel very well, has infrequent and momentary palpitations only, no CP or SOB, no dizziness, near syncope or syncope.  She feels like her level of energy is good, does her ADL's without difficulty, looking forward to the warmer weather to get outside more often.  Her home BP have been much better without the swinging numbers, generally 130-150/70-80 range.  [place] note mentions a fall, in discussion with the patient, this was not a syncopal event, says she had gotten out of the shower was putting lotion on including her feet and slipped.     Past Medical History:  Diagnosis Date  . Chest pain   . Echocardiogram abnormal 2014   grade 2 diastolic dysfunction, EF 50-35%, mild AR, mitral anular calcification with mild MR   . Hypertension   . Hypoglycemia   . Nephrolithiasis   . Osteoporosis   . Paroxysmal SVT (supraventricular tachycardia) ([place]) 2014  . Thyroid disease    No past surgical history on file.  Current Outpatient Prescriptions  Medication Sig Dispense Refill  . diazepam (VALIUM) 5 MG tablet Take 2.5 mg by mouth daily as needed for anxiety.     Marland Kitchen levothyroxine (SYNTHROID, LEVOTHROID) 75 MCG tablet Take 75 mcg by mouth daily before breakfast.    . metoprolol tartrate (LOPRESSOR) 25 MG tablet Take 12.5 mg by mouth in the morning and take 6.25 mg by mouth at night (Patient taking differently: Take 12.5 mg by mouth in the morning and QUARTER TABLET IN PM) 70 tablet 3  . nitroGLYCERIN (NITROSTAT) 0.4  MG SL tablet Place 1 tablet (0.4 mg total) under the tongue every 5 (five) minutes as needed for chest pain. 90 tablet 3  . lisinopril (PRINIVIL,ZESTRIL) 5 MG tablet Take 1 tablet (5 mg total) by mouth daily. 30 tablet 5   No current facility-administered medications for this visit.     No Known Allergies  Social History   Social History  . Marital status: Married    Spouse name: N/A  . Number of children: 1  . Years of education: N/A   Occupational History  . Not on file.   Social History Main Topics  . Smoking status: Former Smoker    Types: Cigarettes    Quit date: 05/18/1969  . Smokeless tobacco: Never Used  . Alcohol use No  . Drug use: No  . Sexual activity: Not on file   Other Topics Concern  . Not on file   Social History Narrative   Lives in [place] with her spouse.  Retired    Family History  Problem Relation Age of Onset  . Breast cancer Mother   . Lung cancer Father     Physical Exam: Vitals:   03/26/17 0954  BP: 134/84  Pulse: 66  Weight: 119 lb (54 kg)  Height: 5' 2.5" (1.588 m)    GEN- The patient is well appearing, alert and oriented x 3 today.   Head- normocephalic, atraumatic Eyes-  Sclera clear, conjunctiva pink Ears- hearing intact Oropharynx- clear Neck- supple, no JVP Lymph- no cervical lymphadenopathy Lungs- CTA b/l, normal work of breathing Heart- RRR, no murmurs, rubs or gallops, PMI not laterally displaced GI- soft, NT, ND Extremities- no clubbing, cyanosis, no edema   EKG 10/17/16 was  sinus rhythm 49 bpm, otherwise normal ekg 05/26/13: TTE Study Conclusions - Left ventricle: The cavity size was normal. Wall thickness was normal. Systolic function was normal. The estimated ejection fraction was in the range of 55% to 60%. Wall motion was normal; there were no regional wall motion abnormalities. Features are consistent with a pseudonormal left ventricular filling pattern, with concomitant abnormal relaxation and  increased filling pressure (grade 2 diastolic dysfunction). Doppler parameters are consistent with elevated mean left atrial filling pressure. - Aortic valve: Mildly thickened leaflets. Sclerosis without stenosis. Mild regurgitation. - Mitral valve: Calcified annulus. Mildly thickened leaflets . Mild regurgitation. Valve area by pressure half-time: 1.93cm^2. - Tricuspid valve: Mild-moderate regurgitation. - Pericardium, extracardiac: A small pericardial effusion (0.845 cm) was identified posterior to the heart.  Assessment and Plan:  1. SVT Stable No change required today She continues to wish to avoid ablation  2. HTN Much better, her home cuff was checked today, reads about 19mmHg higher then a manual reading Making her home SBP generally 120-140 or so  3. Bradycardia remains asymptomatic, HR 60's today No indication for pacing, no further workup planned  No changes today, will see her back in 6 months, sooner if needed   Tommye Standard, PA-C 03/26/2017 10:09 AM

## 2017-03-26 ENCOUNTER — Ambulatory Visit (INDEPENDENT_AMBULATORY_CARE_PROVIDER_SITE_OTHER): Payer: PPO | Admitting: Physician Assistant

## 2017-03-26 VITALS — BP 134/84 | HR 66 | Ht 62.5 in | Wt 119.0 lb

## 2017-03-26 DIAGNOSIS — I10 Essential (primary) hypertension: Secondary | ICD-10-CM | POA: Diagnosis not present

## 2017-03-26 DIAGNOSIS — I471 Supraventricular tachycardia: Secondary | ICD-10-CM

## 2017-03-26 DIAGNOSIS — R001 Bradycardia, unspecified: Secondary | ICD-10-CM | POA: Diagnosis not present

## 2017-03-26 NOTE — Patient Instructions (Addendum)
Medication Instructions:   Your physician recommends that you continue on your current medications as directed. Please refer to the Current Medication list given to you today.   If you need a refill on your cardiac medications before your next appointment, please call your pharmacy.  Labwork: NONE ORDERED  TODAY    Testing/Procedures: NONE ORDERED  TODAY    Follow-Up:  Your physician wants you to follow-up in:  [place] will receive a reminder letter in the mail two months in advance. If you don't receive a letter, please call our office to schedule the follow-up appointment.      Any Other Special Instructions Will Be Listed Below (If Applicable).

## 2017-04-12 ENCOUNTER — Encounter: Payer: Self-pay | Admitting: Cardiology

## 2017-04-12 ENCOUNTER — Ambulatory Visit (INDEPENDENT_AMBULATORY_CARE_PROVIDER_SITE_OTHER): Payer: PPO | Admitting: Cardiology

## 2017-04-12 ENCOUNTER — Telehealth: Payer: Self-pay | Admitting: Internal Medicine

## 2017-04-12 VITALS — BP 142/92 | HR 69 | Ht 62.5 in | Wt 119.4 lb

## 2017-04-12 DIAGNOSIS — I4891 Unspecified atrial fibrillation: Secondary | ICD-10-CM | POA: Diagnosis not present

## 2017-04-12 DIAGNOSIS — I495 Sick sinus syndrome: Secondary | ICD-10-CM | POA: Diagnosis not present

## 2017-04-12 DIAGNOSIS — I471 Supraventricular tachycardia: Secondary | ICD-10-CM | POA: Diagnosis not present

## 2017-04-12 DIAGNOSIS — I10 Essential (primary) hypertension: Secondary | ICD-10-CM | POA: Diagnosis not present

## 2017-04-12 DIAGNOSIS — R001 Bradycardia, unspecified: Secondary | ICD-10-CM | POA: Diagnosis not present

## 2017-04-12 MED ORDER — APIXABAN 2.5 MG PO TABS: 5.0000 mg | ORAL_TABLET | Freq: Two times a day (BID) | ORAL | Status: DC

## 2017-04-12 MED ORDER — METOPROLOL TARTRATE 25 MG PO TABS: 12.5000 mg | ORAL_TABLET | Freq: Two times a day (BID) | ORAL | 3 refills | Status: DC

## 2017-04-12 NOTE — Patient Instructions (Addendum)
Medication Instructions:  Your physician has recommended you make the following change in your medication: ELIQUIS 5 mg twice a day. We have increased your LOPRESSOR to 12.5 twice a day.   Labwork: Your physician recommends that you return for lab work today: CMET, CBC   Testing/Procedures: Your physician has requested that you have an echocardiogram. Echocardiography is a painless test that uses sound waves to create images of your heart. It provides your doctor with information about the size and shape of your heart and how well your heart's chambers and valves are working. This procedure takes approximately one hour. There are no restrictions for this procedure.    Follow-Up: AFIB clinic next week.  Any Other Special Instructions Will Be Listed Below (If Applicable).     If you need a refill on your cardiac medications before your next appointment, please call your pharmacy.

## 2017-04-12 NOTE — Telephone Encounter (Signed)
Spoke with patient, she has been experiencing fluctuating HR's and BP's over the last several days, and "just feel weak and jittery".   Her symptoms have been persistent the last several days. HR yesterday was 133 and this morning it was 131.  She took her morning medications and there rate is down to 79 but her BP is not able to pick up she says.  She is requesting to be seen.  I have discussed with Tommye Standard, PA who saw the patient on 03/26/17 and a monitor was suggested.  She also said if the patient did not feel well I should have her seen.  After speaking back with the patient she would feel more comfortable being seen as we are approaching the weekend.  I have added her on today at 3:30pm to see [name], PA.  Both patient and staff aware.

## 2017-04-12 NOTE — Progress Notes (Signed)
Cardiology Office Note   Date:  04/12/2017   ID:  Brandi Chambers, DOB Mar 19, 1932, MRN [id number]  PCP:  Tivis Ringer, MD  Cardiologist:    Dr. [name]  Chief Complaint  Patient presents with  . Hypertension    Pt state feels like faster heartbeat  . Back Pain    upper back pain and fatique  . Palpitations    her heart rate has been up and down      History of Present Illness: Brandi Chambers is a 83 y.o. female who presents for irregular HR and not feeling well. She called the office due to heart rate up and down.  She tells me the irregular HR began on Tuesday and she began feeling weak.  On Wed. She felt really bad and stayed in bed all day. Wed. night she took 12.5 of lopressor and felt better.  Yesterday she did feel better but woke again at 3 Am with rapid HR and not feeling well - mostly weak.  No chest pain some back pain.  Currently still weak but overall better.      She has a PMHx of SVT, HTN, hypothyroidism and asymptomatic bradycardia to 49.  With SVT and bradycardia Dr. [name] has discussed PPM with her.    Echo in 2014 with normal EF G2DD, mitral sclerosis, , mild to mod regurg, small pericardial effusion. LA was at upper limits of normal in size.   EKG with a fib with RVR new diagnosis.    She has never had any GI bleeding and no anemia.  CHA2DS2VASc 4 - we discussed atrial fib and risk for stroke.  Also with anticoagulation.    Past Medical History:  Diagnosis Date  . Chest pain   . Echocardiogram abnormal 2014   grade 2 diastolic dysfunction, EF 37-10%, mild AR, mitral anular calcification with mild MR   . Hypertension   . Hypoglycemia   . Nephrolithiasis   . Osteoporosis   . Paroxysmal SVT (supraventricular tachycardia) ([place]) 2014  . Thyroid disease     No past surgical history on file.   Current Outpatient Prescriptions  Medication Sig Dispense Refill  . diazepam (VALIUM) 5 MG tablet Take 2.5 mg by mouth daily as needed for anxiety.     Marland Kitchen  levothyroxine (SYNTHROID, LEVOTHROID) 75 MCG tablet Take 75 mcg by mouth daily before breakfast.    . metoprolol tartrate (LOPRESSOR) 25 MG tablet Take 12.5 mg by mouth in the morning and take 6.25 mg by mouth at night (Patient taking differently: Take 12.5 mg by mouth in the morning and QUARTER TABLET IN PM) 70 tablet 3  . nitroGLYCERIN (NITROSTAT) 0.4 MG SL tablet Place 1 tablet (0.4 mg total) under the tongue every 5 (five) minutes as needed for chest pain. 90 tablet 3  . lisinopril (PRINIVIL,ZESTRIL) 5 MG tablet Take 1 tablet (5 mg total) by mouth daily. 30 tablet 5   No current facility-administered medications for this visit.     Allergies:   Patient has no known allergies.    Social History:  The patient  reports that she quit smoking about 47 years ago. Her smoking use included Cigarettes. She has never used smokeless tobacco. She reports that she does not drink alcohol or use drugs.   Family History:  The patient's family history includes Breast cancer in her mother; Lung cancer in her father.    ROS:  General:no colds or fevers, no weight changes Skin:no rashes or ulcers HEENT:no blurred  vision, no congestion CV:see HPI PUL:see HPI GI:no diarrhea constipation or melena, no indigestion GU:no hematuria, no dysuria MS:no joint pain, no claudication Neuro:no syncope, no lightheadedness Endo:no diabetes, no thyroid disease  Wt Readings from Last 3 Encounters:  04/12/17 119 lb 6.4 oz (54.2 kg)  03/26/17 119 lb (54 kg)  01/22/17 119 lb 8 oz (54.2 kg)     PHYSICAL EXAM: VS:  BP (!) 142/92   Pulse 69   Ht 5' 2.5" (1.588 m)   Wt 119 lb 6.4 oz (54.2 kg)   SpO2 98%   BMI 21.49 kg/m  , BMI Body mass index is 21.49 kg/m. General:Pleasant affect, NAD Skin:Warm and dry, brisk capillary refill HEENT:normocephalic, sclera clear, mucus membranes moist Neck:supple, no JVD, no bruits  Heart: Irreg irreg rapid without murmur, gallup, rub or click Lungs:clear without rales, rhonchi,  or wheezes JSH:FWYO, non tender, + BS, do not palpate liver spleen or masses Ext:no lower ext edema, 2+ pedal pulses, 2+ radial pulses Neuro:alert and oriented X 3, MAE, follows commands, + facial symmetry    EKG:  EKG is ordered today. The ekg ordered today demonstrates  A fib with RVR 123 RBBB new as well.     Recent Labs: 09/06/2016: Hemoglobin 14.2; Platelets 225 11/01/2016: BUN 24; Creat 0.98; Potassium 4.5; Sodium 137    Lipid Panel No results found for: CHOL, TRIG, HDL, CHOLHDL, VLDL, LDLCALC, LDLDIRECT     Other studies Reviewed: Additional studies/ records that were reviewed today include:   Echo 2014. Study Conclusions  - Left ventricle: The cavity size was normal. Wall thickness was normal. Systolic function was normal. The estimated ejection fraction was in the range of 55% to 60%. Wall motion was normal; there were no regional wall motion abnormalities. Features are consistent with a pseudonormal left ventricular filling pattern, with concomitant abnormal relaxation and increased filling pressure (grade 2 diastolic dysfunction). Doppler parameters are consistent with elevated mean left atrial filling pressure. - Aortic valve: Mildly thickened leaflets. Sclerosis without stenosis. Mild regurgitation. - Mitral valve: Calcified annulus. Mildly thickened leaflets . Mild regurgitation. Valve area by pressure half-time: 1.93cm^2. - Tricuspid valve: Mild-moderate regurgitation. - Pericardium, extracardiac: A small pericardial effusion (0.845 cm) was identified posterior to the heart.  ASSESSMENT AND PLAN:  1.  Atrial fib with RVR and weakness. BP is stable and elevated a little. Discussed with Dr. Marlou Porch DOD and wil increase her BB to 12.5 BID, and add Eliquis 5 mg BID, Cr is stable.  wil check Echo and CBC today and CMP and TSH.  If she feels worse over the weekend she will go to ER otherwise she will follow up with A fib clinic next  week - with her elevated rate and age and hx of Bradycardia I am concerned she may become more symptomatic.  GOal would be 3 weeks of Eliquis then DCCV.  If she decompensates she would need TEE and DCCV.    2.  HTN increasing BB may help   3.  tachy brady syndrome hx of HR to 49 on higher dose of BB.  Will have her followed up early, she may need PPM.   4. Hx of SVT    Current medicines are reviewed with the patient today.  The patient Has no concerns regarding medicines.  The following changes have been made:  See above Labs/ tests ordered today include:see above  Disposition:   FU:  see above  Signed, [name], NP  04/12/2017 3:35 PM    [facility] [place], [place], [place] Phone: [phone]; Fax: [phone]

## 2017-04-12 NOTE — Telephone Encounter (Signed)
New message      Pt states at 2:30am she woke up with her "heart racing".  She did not do anything.  This am at 8:45, her bp was 147/110 and HR 131.  She has taken morning meds.  Pt states that she feels weak

## 2017-04-16 ENCOUNTER — Encounter (HOSPITAL_COMMUNITY): Payer: Self-pay | Admitting: Nurse Practitioner

## 2017-04-16 ENCOUNTER — Ambulatory Visit (HOSPITAL_COMMUNITY)
Admission: RE | Admit: 2017-04-16 | Discharge: 2017-04-16 | Disposition: A | Discharge status: Home / Self Care | Payer: PPO | Source: Ambulatory Visit | Attending: Nurse Practitioner | Admitting: Nurse Practitioner

## 2017-04-16 VITALS — BP 126/84 | HR 147 | Ht 60.25 in | Wt 118.0 lb

## 2017-04-16 DIAGNOSIS — I4892 Unspecified atrial flutter: Secondary | ICD-10-CM | POA: Insufficient documentation

## 2017-04-16 DIAGNOSIS — Z7902 Long term (current) use of antithrombotics/antiplatelets: Secondary | ICD-10-CM | POA: Diagnosis not present

## 2017-04-16 DIAGNOSIS — I471 Supraventricular tachycardia: Secondary | ICD-10-CM | POA: Diagnosis not present

## 2017-04-16 DIAGNOSIS — Z801 Family history of malignant neoplasm of trachea, bronchus and lung: Secondary | ICD-10-CM | POA: Diagnosis not present

## 2017-04-16 DIAGNOSIS — I10 Essential (primary) hypertension: Secondary | ICD-10-CM | POA: Diagnosis not present

## 2017-04-16 DIAGNOSIS — E079 Disorder of thyroid, unspecified: Secondary | ICD-10-CM | POA: Insufficient documentation

## 2017-04-16 DIAGNOSIS — M810 Age-related osteoporosis without current pathological fracture: Secondary | ICD-10-CM | POA: Insufficient documentation

## 2017-04-16 DIAGNOSIS — Z803 Family history of malignant neoplasm of breast: Secondary | ICD-10-CM | POA: Insufficient documentation

## 2017-04-16 DIAGNOSIS — Z87442 Personal history of urinary calculi: Secondary | ICD-10-CM | POA: Diagnosis not present

## 2017-04-16 DIAGNOSIS — I4891 Unspecified atrial fibrillation: Secondary | ICD-10-CM | POA: Diagnosis not present

## 2017-04-16 DIAGNOSIS — Z87891 Personal history of nicotine dependence: Secondary | ICD-10-CM | POA: Insufficient documentation

## 2017-04-16 LAB — CBC
HCT: 40.6 % (ref 36.0–46.0)
Hemoglobin: 13.5 g/dL (ref 12.0–15.0)
MCH: 28.7 pg (ref 26.0–34.0)
MCHC: 33.3 g/dL (ref 30.0–36.0)
MCV: 86.4 fL (ref 78.0–100.0)
Platelets: 306 10*3/uL (ref 150–400)
RBC: 4.7 MIL/uL (ref 3.87–5.11)
RDW: 13.5 % (ref 11.5–15.5)
WBC: 9.2 10*3/uL (ref 4.0–10.5)

## 2017-04-16 LAB — COMPREHENSIVE METABOLIC PANEL
ALT: 25 U/L (ref 14–54)
AST: 34 U/L (ref 15–41)
Albumin: 3.6 g/dL (ref 3.5–5.0)
Alkaline Phosphatase: 90 U/L (ref 38–126)
Anion gap: 8 (ref 5–15)
BUN: 24 mg/dL — ABNORMAL HIGH (ref 6–20)
CO2: 26 mmol/L (ref 22–32)
Calcium: 9.4 mg/dL (ref 8.9–10.3)
Chloride: 101 mmol/L (ref 101–111)
Creatinine, Ser: 1.17 mg/dL — ABNORMAL HIGH (ref 0.44–1.00)
GFR calc Af Amer: 49 mL/min — ABNORMAL LOW (ref >60)
GFR calc non Af Amer: 42 mL/min — ABNORMAL LOW (ref >60)
Glucose, Bld: 98 mg/dL (ref 65–99)
Potassium: 4.7 mmol/L (ref 3.5–5.1)
Sodium: 135 mmol/L (ref 135–145)
Total Bilirubin: 0.5 mg/dL (ref 0.3–1.2)
Total Protein: 7.1 g/dL (ref 6.5–8.1)

## 2017-04-16 LAB — TSH: TSH: 0.353 u[IU]/mL (ref 0.350–4.500)

## 2017-04-16 MED ORDER — METOPROLOL TARTRATE 25 MG PO TABS: ORAL_TABLET | ORAL | 3 refills | Status: DC

## 2017-04-16 MED ORDER — APIXABAN 2.5 MG PO TABS: 2.5000 mg | ORAL_TABLET | Freq: Two times a day (BID) | ORAL | 6 refills | Status: DC

## 2017-04-16 MED ORDER — APIXABAN 2.5 MG PO TABS: 2.5000 mg | ORAL_TABLET | Freq: Two times a day (BID) | ORAL | 0 refills | Status: DC

## 2017-04-16 NOTE — Progress Notes (Signed)
Primary Care Physician: Tivis Ringer, MD Referring Physician: [name], PA   Brandi Chambers is a 83 y.o. female with a h/o SVT  treated by Dr. [name] in August 2016 that is in the afib clinic today at the referral of [name], NP, for  afib with rvr on 4/27. She was asked to start eliquis 5 mg bid but she has not started "scared of those drugs." BB was increased to 12.5 mg bid.HR today is 147 bpm, and she appears to be tolerating well. She also has h/o of tachy/brady syndrome, slow when she is in SR.   Today, she denies symptoms of palpitations, chest pain, shortness of breath, orthopnea, PND, lower extremity edema, dizziness, presyncope, syncope, or neurologic sequela. Positive for some mild fatigue.The patient is tolerating medications without difficulties and is otherwise without complaint today.   Past Medical History:  Diagnosis Date  . Chest pain   . Echocardiogram abnormal 2014   grade 2 diastolic dysfunction, EF 13-24%, mild AR, mitral anular calcification with mild MR   . Hypertension   . Hypoglycemia   . Nephrolithiasis   . Osteoporosis   . Paroxysmal SVT (supraventricular tachycardia) ([place]) 2014  . Thyroid disease    No past surgical history on file.  Current Outpatient Prescriptions  Medication Sig Dispense Refill  . apixaban (ELIQUIS) 2.5 MG TABS tablet Take 1 tablet (2.5 mg total) by mouth 2 (two) times daily. 60 tablet 6  . diazepam (VALIUM) 5 MG tablet Take 2.5 mg by mouth daily as needed for anxiety.     Marland Kitchen levothyroxine (SYNTHROID, LEVOTHROID) 75 MCG tablet Take 75 mcg by mouth daily before breakfast.    . lisinopril (PRINIVIL,ZESTRIL) 5 MG tablet Take 1 tablet (5 mg total) by mouth daily. 30 tablet 5  . metoprolol tartrate (LOPRESSOR) 25 MG tablet Take 1 tablet in the morning and 1/2 tablet in the evening 180 tablet 3  . nitroGLYCERIN (NITROSTAT) 0.4 MG SL tablet Place 1 tablet (0.4 mg total) under the tongue every 5 (five) minutes as needed for chest  pain. 90 tablet 3   No current facility-administered medications for this encounter.     No Known Allergies  Social History   Social History  . Marital status: Married    Spouse name: N/A  . Number of children: 1  . Years of education: N/A   Occupational History  . Not on file.   Social History Main Topics  . Smoking status: Former Smoker    Types: Cigarettes    Quit date: 05/18/1969  . Smokeless tobacco: Never Used  . Alcohol use No  . Drug use: No  . Sexual activity: Not on file   Other Topics Concern  . Not on file   Social History Narrative   Lives in [place] with her spouse.  Retired    Family History  Problem Relation Age of Onset  . Breast cancer Mother   . Lung cancer Father     ROS- All systems are reviewed and negative except as per the HPI above  Physical Exam: Vitals:   04/16/17 0841  BP: 126/84  Pulse: (!) 147  Weight: 118 lb (53.5 kg)  Height: 5' 0.25" (1.53 m)   Wt Readings from Last 3 Encounters:  04/16/17 118 lb (53.5 kg)  04/12/17 119 lb 6.4 oz (54.2 kg)  03/26/17 119 lb (54 kg)    Labs: Lab Results  Component Value Date   NA 135 04/16/2017   K 4.7 04/16/2017  CL 101 04/16/2017   CO2 26 04/16/2017   GLUCOSE 98 04/16/2017   BUN 24 (H) 04/16/2017   CREATININE 1.17 (H) 04/16/2017   CALCIUM 9.4 04/16/2017   No results found for: INR No results found for: CHOL, HDL, LDLCALC, TRIG   GEN- The patient is well appearing, alert and oriented x 3 today.   Head- normocephalic, atraumatic Eyes-  Sclera clear, conjunctiva pink Ears- hearing intact Oropharynx- clear Neck- supple, no JVP Lymph- no cervical lymphadenopathy Lungs- Clear to ausculation bilaterally, normal work of breathing Heart- Rapid  rate and rhythm, no murmurs, rubs or gallops, PMI not laterally displaced GI- soft, NT, ND, + BS Extremities- no clubbing, cyanosis, or edema MS- no significant deformity or atrophy Skin- no rash or lesion Psych- euthymic mood, full  affect Neuro- strength and sensation are intact  EKG-Aflutter vrs afib at 147 bpm Epic records reviewed   Assessment and Plan: 1. Aflutter vrs afib with rvr General education of afib discussed No triggers identified Increase metoprolol tartrate to 25 mg am and continue 12.5 mg pm Will order eliquis 2.5 mg bid for age over 28, weight less than 60 kg and creatinine at 1.17.  Gives no bleeding history and bleeding precautions discussed bmet/cbc/tsh  F/u this Thursday for rate control. If cardioverted will have to hold BB's day of cardioversion for h/o brady.  Geroge Baseman Azarah Dacy, [facility] [address] [place], [place] [address] [phone]

## 2017-04-16 NOTE — Patient Instructions (Signed)
Your physician has recommended you make the following change in your medication:  1) INCREASE metoprolol in the morning to 1 tablet (25mg ) and 1/2 tablet (12.5mg ) in the evening.  2)START Eliquis 2.5mg  twice a day

## 2017-04-18 ENCOUNTER — Ambulatory Visit (HOSPITAL_COMMUNITY)
Admission: RE | Admit: 2017-04-18 | Discharge: 2017-04-18 | Disposition: A | Discharge status: Home / Self Care | Payer: PPO | Source: Ambulatory Visit | Attending: Nurse Practitioner | Admitting: Nurse Practitioner

## 2017-04-18 ENCOUNTER — Encounter (HOSPITAL_COMMUNITY): Payer: Self-pay | Admitting: Nurse Practitioner

## 2017-04-18 VITALS — BP 124/76 | HR 124 | Ht 60.25 in | Wt 118.2 lb

## 2017-04-18 DIAGNOSIS — Z79899 Other long term (current) drug therapy: Secondary | ICD-10-CM | POA: Diagnosis not present

## 2017-04-18 DIAGNOSIS — I4891 Unspecified atrial fibrillation: Secondary | ICD-10-CM | POA: Diagnosis not present

## 2017-04-18 DIAGNOSIS — I493 Ventricular premature depolarization: Secondary | ICD-10-CM | POA: Insufficient documentation

## 2017-04-18 DIAGNOSIS — E079 Disorder of thyroid, unspecified: Secondary | ICD-10-CM | POA: Insufficient documentation

## 2017-04-18 DIAGNOSIS — I444 Left anterior fascicular block: Secondary | ICD-10-CM | POA: Insufficient documentation

## 2017-04-18 DIAGNOSIS — I10 Essential (primary) hypertension: Secondary | ICD-10-CM | POA: Diagnosis not present

## 2017-04-18 DIAGNOSIS — Z87891 Personal history of nicotine dependence: Secondary | ICD-10-CM | POA: Insufficient documentation

## 2017-04-18 DIAGNOSIS — Z7901 Long term (current) use of anticoagulants: Secondary | ICD-10-CM | POA: Insufficient documentation

## 2017-04-18 MED ORDER — METOPROLOL TARTRATE 25 MG PO TABS: 25.0000 mg | ORAL_TABLET | Freq: Two times a day (BID) | ORAL | 3 refills | Status: DC

## 2017-04-18 NOTE — Patient Instructions (Signed)
Your physician has recommended you make the following change in your medication:  1)Increase metoprolol 25mg  twice a day

## 2017-04-18 NOTE — Progress Notes (Signed)
Primary Care Physician: Tivis Ringer, MD Referring Physician: [name], PA   Brandi Chambers is a 83 y.o. female with a h/o SVT  treated by Dr. [name] in August 2016 that is in the afib clinic today at the referral of [name], NP, for  afib with rvr on 4/27. She was asked to start eliquis 5 mg bid but she has not started "scared of those drugs." BB was increased to 12.5 mg bid.HR today is 147 bpm, and she appears to be tolerating well. She also has h/o of tachy/brady syndrome, slow when she is in SR. Chadsvasc score is at least 4.  Returns 5/3, afib now at 124 bpm. She is tolerating increase in BB. States her readings at home are lower, if accurate. Will have to be cautious of brady when she returns to [place]. Other than feeling tired, she is tolearating ok. Weight is stable. She has started eliquis. Weight is stable. Pending echo update 5/4.  Today, she denies symptoms of palpitations, chest pain, shortness of breath, orthopnea, PND, lower extremity edema, dizziness, presyncope, syncope, or neurologic sequela. Positive for some mild fatigue.The patient is tolerating medications without difficulties and is otherwise without complaint today.   Past Medical History:  Diagnosis Date  . Chest pain   . Echocardiogram abnormal 2014   grade 2 diastolic dysfunction, EF 49-67%, mild AR, mitral anular calcification with mild MR   . Hypertension   . Hypoglycemia   . Nephrolithiasis   . Osteoporosis   . Paroxysmal SVT (supraventricular tachycardia) ([place]) 2014  . Thyroid disease    No past surgical history on file.  Current Outpatient Prescriptions  Medication Sig Dispense Refill  . apixaban (ELIQUIS) 2.5 MG TABS tablet Take 1 tablet (2.5 mg total) by mouth 2 (two) times daily. 60 tablet 6  . diazepam (VALIUM) 5 MG tablet Take 2.5 mg by mouth daily as needed for anxiety.     Marland Kitchen levothyroxine (SYNTHROID, LEVOTHROID) 75 MCG tablet Take 75 mcg by mouth daily before breakfast.    . lisinopril  (PRINIVIL,ZESTRIL) 5 MG tablet Take 1 tablet (5 mg total) by mouth daily. 30 tablet 5  . metoprolol tartrate (LOPRESSOR) 25 MG tablet Take 1 tablet (25 mg total) by mouth 2 (two) times daily. Take 1 tablet in the morning and 1/2 tablet in the evening 180 tablet 3  . nitroGLYCERIN (NITROSTAT) 0.4 MG SL tablet Place 1 tablet (0.4 mg total) under the tongue every 5 (five) minutes as needed for chest pain. 90 tablet 3   No current facility-administered medications for this encounter.     No Known Allergies  Social History   Social History  . Marital status: Married    Spouse name: N/A  . Number of children: 1  . Years of education: N/A   Occupational History  . Not on file.   Social History Main Topics  . Smoking status: Former Smoker    Types: Cigarettes    Quit date: 05/18/1969  . Smokeless tobacco: Never Used  . Alcohol use No  . Drug use: No  . Sexual activity: Not on file   Other Topics Concern  . Not on file   Social History Narrative   Lives in [place] with her spouse.  Retired    Family History  Problem Relation Age of Onset  . Breast cancer Mother   . Lung cancer Father     ROS- All systems are reviewed and negative except as per the HPI above  Physical  Exam: Vitals:   04/18/17 0822  BP: 124/76  Pulse: (!) 124  Weight: 118 lb 3.2 oz (53.6 kg)  Height: 5' 0.25" (1.53 m)   Wt Readings from Last 3 Encounters:  04/18/17 118 lb 3.2 oz (53.6 kg)  04/16/17 118 lb (53.5 kg)  04/12/17 119 lb 6.4 oz (54.2 kg)    Labs: Lab Results  Component Value Date   NA 135 04/16/2017   K 4.7 04/16/2017   CL 101 04/16/2017   CO2 26 04/16/2017   GLUCOSE 98 04/16/2017   BUN 24 (H) 04/16/2017   CREATININE 1.17 (H) 04/16/2017   CALCIUM 9.4 04/16/2017   No results found for: INR No results found for: CHOL, HDL, LDLCALC, TRIG   GEN- The patient is well appearing, alert and oriented x 3 today.   Head- normocephalic, atraumatic Eyes-  Sclera clear, conjunctiva  pink Ears- hearing intact Oropharynx- clear Neck- supple, no JVP Lymph- no cervical lymphadenopathy Lungs- Clear to ausculation bilaterally, normal work of breathing Heart- Rapid irregular  rate and rhythm, no murmurs, rubs or gallops, PMI not laterally displaced GI- soft, NT, ND, + BS Extremities- no clubbing, cyanosis, or edema MS- no significant deformity or atrophy Skin- no rash or lesion Psych- euthymic mood, full affect Neuro- strength and sensation are intact  EKG-afib at 124 bpm, qrs int 124 ms, qtc 494 ms Epic records reviewed   Assessment and Plan: 1. afib with rvr Increase metoprolol tartrate to 25 mg am and 25 mg pm Continue eliquis 2.5 mg bid for age over 13, weight less than 60 kg and creatinine at 1.17, for chadsvasc score of at least 4. Gives no bleeding history and bleeding precautions discussed When cardioverted, will have to hold BB's for that day and then reduce dose for brady when is SR  F/u 10 days  Butch Penny C. Carroll, [facility] [address] [place], [place] [address] [phone]

## 2017-04-26 ENCOUNTER — Other Ambulatory Visit: Payer: Self-pay

## 2017-04-26 ENCOUNTER — Ambulatory Visit (HOSPITAL_COMMUNITY): Payer: PPO | Attending: Cardiology

## 2017-04-26 DIAGNOSIS — I361 Nonrheumatic tricuspid (valve) insufficiency: Secondary | ICD-10-CM | POA: Insufficient documentation

## 2017-04-26 DIAGNOSIS — Z87891 Personal history of nicotine dependence: Secondary | ICD-10-CM | POA: Diagnosis not present

## 2017-04-26 DIAGNOSIS — I119 Hypertensive heart disease without heart failure: Secondary | ICD-10-CM | POA: Diagnosis not present

## 2017-04-26 DIAGNOSIS — I351 Nonrheumatic aortic (valve) insufficiency: Secondary | ICD-10-CM | POA: Insufficient documentation

## 2017-04-26 DIAGNOSIS — I471 Supraventricular tachycardia: Secondary | ICD-10-CM | POA: Insufficient documentation

## 2017-04-26 DIAGNOSIS — I340 Nonrheumatic mitral (valve) insufficiency: Secondary | ICD-10-CM | POA: Insufficient documentation

## 2017-04-26 DIAGNOSIS — I313 Pericardial effusion (noninflammatory): Secondary | ICD-10-CM | POA: Diagnosis not present

## 2017-04-26 DIAGNOSIS — R002 Palpitations: Secondary | ICD-10-CM | POA: Insufficient documentation

## 2017-04-30 ENCOUNTER — Other Ambulatory Visit: Payer: Self-pay

## 2017-04-30 ENCOUNTER — Ambulatory Visit (HOSPITAL_COMMUNITY)
Admission: RE | Admit: 2017-04-30 | Discharge: 2017-04-30 | Disposition: A | Discharge status: Home / Self Care | Payer: PPO | Source: Ambulatory Visit | Attending: Nurse Practitioner | Admitting: Nurse Practitioner

## 2017-04-30 ENCOUNTER — Encounter (HOSPITAL_COMMUNITY): Payer: Self-pay | Admitting: Nurse Practitioner

## 2017-04-30 VITALS — BP 112/58 | HR 109 | Ht 60.25 in | Wt 123.0 lb

## 2017-04-30 DIAGNOSIS — Z79899 Other long term (current) drug therapy: Secondary | ICD-10-CM | POA: Diagnosis not present

## 2017-04-30 DIAGNOSIS — E079 Disorder of thyroid, unspecified: Secondary | ICD-10-CM | POA: Insufficient documentation

## 2017-04-30 DIAGNOSIS — I471 Supraventricular tachycardia: Secondary | ICD-10-CM | POA: Insufficient documentation

## 2017-04-30 DIAGNOSIS — M810 Age-related osteoporosis without current pathological fracture: Secondary | ICD-10-CM | POA: Diagnosis not present

## 2017-04-30 DIAGNOSIS — Z87891 Personal history of nicotine dependence: Secondary | ICD-10-CM | POA: Insufficient documentation

## 2017-04-30 DIAGNOSIS — Z7901 Long term (current) use of anticoagulants: Secondary | ICD-10-CM | POA: Diagnosis not present

## 2017-04-30 DIAGNOSIS — I4891 Unspecified atrial fibrillation: Secondary | ICD-10-CM | POA: Insufficient documentation

## 2017-04-30 DIAGNOSIS — I10 Essential (primary) hypertension: Secondary | ICD-10-CM | POA: Insufficient documentation

## 2017-04-30 LAB — BASIC METABOLIC PANEL
Anion gap: 6 (ref 5–15)
BUN: 26 mg/dL — ABNORMAL HIGH (ref 6–20)
CO2: 24 mmol/L (ref 22–32)
Calcium: 8.9 mg/dL (ref 8.9–10.3)
Chloride: 104 mmol/L (ref 101–111)
Creatinine, Ser: 1.16 mg/dL — ABNORMAL HIGH (ref 0.44–1.00)
GFR calc Af Amer: 49 mL/min — ABNORMAL LOW (ref >60)
GFR calc non Af Amer: 43 mL/min — ABNORMAL LOW (ref >60)
Glucose, Bld: 90 mg/dL (ref 65–99)
Potassium: 4.8 mmol/L (ref 3.5–5.1)
Sodium: 134 mmol/L — ABNORMAL LOW (ref 135–145)

## 2017-04-30 LAB — CBC
HCT: 38.3 % (ref 36.0–46.0)
Hemoglobin: 12.3 g/dL (ref 12.0–15.0)
MCH: 28.1 pg (ref 26.0–34.0)
MCHC: 32.1 g/dL (ref 30.0–36.0)
MCV: 87.4 fL (ref 78.0–100.0)
Platelets: 210 10*3/uL (ref 150–400)
RBC: 4.38 MIL/uL (ref 3.87–5.11)
RDW: 14.2 % (ref 11.5–15.5)
WBC: 7.8 10*3/uL (ref 4.0–10.5)

## 2017-04-30 NOTE — Progress Notes (Signed)
Primary Care Physician: [name], MD Referring Physician: [name], PA EP [name] is a 83 y.o. female with a h/o SVT  treated by Dr. [name] in August 2016 that is in the afib clinic today at the referral of [name], NP, for  afib with rvr on 4/27. She was asked to start eliquis 5 mg bid but she has not started "scared of those drugs." BB was increased to 12.5 mg bid.HR today is 147 bpm, and she appears to be tolerating well. She also has h/o of tachy/brady syndrome, slow when she is in SR. Chadsvasc score is at least 4.  Returns 5/3, afib now at 124 bpm. She is tolerating increase in BB. States her readings at home are lower, if accurate. Will have to be cautious of brady when she returns to [place]. Other than feeling tired, she is tolearating ok. Weight is stable. She has started eliquis. Weight is stable. Pending echo update 5/4.  Returns 5/15 and continues in afib with at rest HR controlled around 100 bpm. She is not symptomatic at rest, notices increase in dyspnea with exertion. She has been taking eliquis consistently since 5/2 without missed doses at 2.5 mg bid.  Today, she denies symptoms of palpitations, chest pain, shortness of breath, orthopnea, PND, lower extremity edema, dizziness, presyncope, syncope, or neurologic sequela. Positive for some mild fatigue/exertional dyspnea.The patient is tolerating medications without difficulties and is otherwise without complaint today.   Past Medical History:  Diagnosis Date  . Chest pain   . Echocardiogram abnormal 2014   grade 2 diastolic dysfunction, EF 67-89%, mild AR, mitral anular calcification with mild MR   . Hypertension   . Hypoglycemia   . Nephrolithiasis   . Osteoporosis   . Paroxysmal SVT (supraventricular tachycardia) ([place]) 2014  . Thyroid disease    No past surgical history on file.  Current Outpatient Prescriptions  Medication Sig Dispense Refill  . apixaban (ELIQUIS) 2.5 MG TABS tablet  Take 1 tablet (2.5 mg total) by mouth 2 (two) times daily. 60 tablet 6  . diazepam (VALIUM) 5 MG tablet Take 2.5 mg by mouth daily as needed for anxiety.     Marland Kitchen levothyroxine (SYNTHROID, LEVOTHROID) 75 MCG tablet Take 75 mcg by mouth daily before breakfast.    . lisinopril (PRINIVIL,ZESTRIL) 5 MG tablet Take 1 tablet (5 mg total) by mouth daily. 30 tablet 5  . metoprolol tartrate (LOPRESSOR) 25 MG tablet Take 1 tablet (25 mg total) by mouth 2 (two) times daily. Take 1 tablet in the morning and 1/2 tablet in the evening (Patient taking differently: Take 25 mg by mouth 2 (two) times daily. ) 180 tablet 3  . nitroGLYCERIN (NITROSTAT) 0.4 MG SL tablet Place 1 tablet (0.4 mg total) under the tongue every 5 (five) minutes as needed for chest pain. 90 tablet 3   No current facility-administered medications for this encounter.     No Known Allergies  Social History   Social History  . Marital status: Married    Spouse name: N/A  . Number of children: 1  . Years of education: N/A   Occupational History  . Not on file.   Social History Main Topics  . Smoking status: Former Smoker    Types: Cigarettes    Quit date: 05/18/1969  . Smokeless tobacco: Never Used  . Alcohol use No  . Drug use: No  . Sexual activity: Not on file   Other Topics Concern  .  Not on file   Social History Narrative   Lives in [place] with her spouse.  Retired    Family History  Problem Relation Age of Onset  . Breast cancer Mother   . Lung cancer Father     ROS- All systems are reviewed and negative except as per the HPI above  Physical Exam: Vitals:   04/30/17 0835  BP: (!) 112/58  Pulse: (!) 109  Weight: 123 lb (55.8 kg)  Height: 5' 0.25" (1.53 m)   Wt Readings from Last 3 Encounters:  04/30/17 123 lb (55.8 kg)  04/18/17 118 lb 3.2 oz (53.6 kg)  04/16/17 118 lb (53.5 kg)    Labs: Lab Results  Component Value Date   NA 134 (L) 04/30/2017   K 4.8 04/30/2017   CL 104 04/30/2017   CO2 24  04/30/2017   GLUCOSE 90 04/30/2017   BUN 26 (H) 04/30/2017   CREATININE 1.16 (H) 04/30/2017   CALCIUM 8.9 04/30/2017   No results found for: INR No results found for: CHOL, HDL, LDLCALC, TRIG   GEN- The patient is well appearing, alert and oriented x 3 today.   Head- normocephalic, atraumatic Eyes-  Sclera clear, conjunctiva pink Ears- hearing intact Oropharynx- clear Neck- supple, no JVP Lymph- no cervical lymphadenopathy Lungs- Clear to ausculation bilaterally, normal work of breathing Heart- Rapid irregular  rate and rhythm, no murmurs, rubs or gallops, PMI not laterally displaced GI- soft, NT, ND, + BS Extremities- no clubbing, cyanosis, or edema MS- no significant deformity or atrophy Skin- no rash or lesion Psych- euthymic mood, full affect Neuro- strength and sensation are intact  EKG-afib at 109 bpm, qrs int 120 ms, qtc 430 ms Epic records reviewed   Assessment and Plan: 1. Afib with rvr Continue metoprolol tartrate  25 mg am and 25 mg pm Continue eliquis 2.5 mg bid for age over 19, weight less than 60 kg and creatinine at 1.17, for chadsvasc score of at least 4, consistently taking since 5/2.  Given options of cardioversion, risk vrs benefit and pt wants to proceed with cardioversion. Importance of not missing any Eliquis stressed.  H/o  tachy/brady When cardioverted, will have to hold BB's for that am and then reduce dose for brady when is SR. She will return to 1/2 tab metoprolol tartrate in the am and 1/4 tab in the pm. Scheduled for cardioversion 5/29 Bmet/mag drawn today    Butch Penny C. Ruchel Brandenburger, [facility] [address] [place], [place] [address] [phone]

## 2017-04-30 NOTE — Patient Instructions (Signed)
Cardioversion scheduled for Tuesday, May 29th  - Arrive at the [facility] and go to admitting at 11:30AM  -Do not eat or drink anything after midnight the night prior to your procedure.  - Take all your medication with a sip of water prior to arrival EXCEPT your  metoprolol  - You will not be able to drive home after your procedure.   After cardioversion reduce metoprolol to 1/2 tablet in the morning and 1/4 of a tablet in the evening.

## 2017-05-14 ENCOUNTER — Encounter (HOSPITAL_COMMUNITY)
Admission: RE | Disposition: A | Discharge status: Home / Self Care | Payer: Self-pay | Source: Ambulatory Visit | Attending: Cardiovascular Disease

## 2017-05-14 ENCOUNTER — Ambulatory Visit (HOSPITAL_COMMUNITY)
Admission: RE | Admit: 2017-05-14 | Discharge: 2017-05-14 | Disposition: A | Discharge status: Home / Self Care | Payer: PPO | Source: Ambulatory Visit | Attending: Cardiovascular Disease | Admitting: Cardiovascular Disease

## 2017-05-14 ENCOUNTER — Ambulatory Visit (HOSPITAL_COMMUNITY): Payer: PPO | Admitting: Anesthesiology

## 2017-05-14 ENCOUNTER — Encounter (HOSPITAL_COMMUNITY): Payer: Self-pay | Admitting: Cardiovascular Disease

## 2017-05-14 DIAGNOSIS — I082 Rheumatic disorders of both aortic and tricuspid valves: Secondary | ICD-10-CM | POA: Diagnosis not present

## 2017-05-14 DIAGNOSIS — I481 Persistent atrial fibrillation: Secondary | ICD-10-CM | POA: Diagnosis not present

## 2017-05-14 DIAGNOSIS — Z79899 Other long term (current) drug therapy: Secondary | ICD-10-CM | POA: Diagnosis not present

## 2017-05-14 DIAGNOSIS — I471 Supraventricular tachycardia: Secondary | ICD-10-CM | POA: Diagnosis not present

## 2017-05-14 DIAGNOSIS — E039 Hypothyroidism, unspecified: Secondary | ICD-10-CM | POA: Insufficient documentation

## 2017-05-14 DIAGNOSIS — I4891 Unspecified atrial fibrillation: Secondary | ICD-10-CM | POA: Diagnosis not present

## 2017-05-14 DIAGNOSIS — Z7901 Long term (current) use of anticoagulants: Secondary | ICD-10-CM | POA: Insufficient documentation

## 2017-05-14 DIAGNOSIS — I10 Essential (primary) hypertension: Secondary | ICD-10-CM | POA: Diagnosis not present

## 2017-05-14 DIAGNOSIS — Z87891 Personal history of nicotine dependence: Secondary | ICD-10-CM | POA: Insufficient documentation

## 2017-05-14 DIAGNOSIS — R001 Bradycardia, unspecified: Secondary | ICD-10-CM | POA: Diagnosis not present

## 2017-05-14 DIAGNOSIS — I4819 Other persistent atrial fibrillation: Secondary | ICD-10-CM

## 2017-05-14 HISTORY — PX: CARDIOVERSION: SHX1299

## 2017-05-14 LAB — POCT I-STAT 4, (NA,K, GLUC, HGB,HCT)
Glucose, Bld: 83 mg/dL (ref 65–99)
HCT: 41 % (ref 36.0–46.0)
Hemoglobin: 13.9 g/dL (ref 12.0–15.0)
Potassium: 5 mmol/L (ref 3.5–5.1)
Sodium: 140 mmol/L (ref 135–145)

## 2017-05-14 SURGERY — CARDIOVERSION
Anesthesia: General

## 2017-05-14 MED ORDER — SODIUM CHLORIDE 0.9 % IV SOLN
INTRAVENOUS | Status: DC
Start: 2017-05-14 — End: 2017-05-14
  Administered 2017-05-14: 13:00:00 via INTRAVENOUS

## 2017-05-14 MED ORDER — PROPOFOL 10 MG/ML IV BOLUS
INTRAVENOUS | Status: DC | PRN
  Administered 2017-05-14: 40 mg via INTRAVENOUS

## 2017-05-14 MED ORDER — METOPROLOL TARTRATE 25 MG PO TABS
25.0000 mg | ORAL_TABLET | Freq: Once | ORAL | Status: AC
  Administered 2017-05-14: 25 mg via ORAL
  Filled 2017-05-14: qty 1

## 2017-05-14 MED ORDER — LIDOCAINE HCL (CARDIAC) 20 MG/ML IV SOLN
INTRAVENOUS | Status: DC | PRN
  Administered 2017-05-14: 60 mg via INTRAVENOUS

## 2017-05-14 MED ORDER — APIXABAN 2.5 MG PO TABS
2.5000 mg | ORAL_TABLET | Freq: Once | ORAL | Status: AC
  Administered 2017-05-14: 2.5 mg via ORAL
  Filled 2017-05-14: qty 1

## 2017-05-14 NOTE — H&P (View-Only) (Signed)
Primary Care Physician: [name], MD Referring Physician: [name], PA EP [name] is a 83 y.o. female with a h/o SVT  treated by Dr. [name] in August 2016 that is in the afib clinic today at the referral of [name], NP, for  afib with rvr on 4/27. She was asked to start eliquis 5 mg bid but she has not started "scared of those drugs." BB was increased to 12.5 mg bid.HR today is 147 bpm, and she appears to be tolerating well. She also has h/o of tachy/brady syndrome, slow when she is in SR. Chadsvasc score is at least 4.  Returns 5/3, afib now at 124 bpm. She is tolerating increase in BB. States her readings at home are lower, if accurate. Will have to be cautious of brady when she returns to [place]. Other than feeling tired, she is tolearating ok. Weight is stable. She has started eliquis. Weight is stable. Pending echo update 5/4.  Returns 5/15 and continues in afib with at rest HR controlled around 100 bpm. She is not symptomatic at rest, notices increase in dyspnea with exertion. She has been taking eliquis consistently since 5/2 without missed doses at 2.5 mg bid.  Today, she denies symptoms of palpitations, chest pain, shortness of breath, orthopnea, PND, lower extremity edema, dizziness, presyncope, syncope, or neurologic sequela. Positive for some mild fatigue/exertional dyspnea.The patient is tolerating medications without difficulties and is otherwise without complaint today.   Past Medical History:  Diagnosis Date  . Chest pain   . Echocardiogram abnormal 2014   grade 2 diastolic dysfunction, EF 31-54%, mild AR, mitral anular calcification with mild MR   . Hypertension   . Hypoglycemia   . Nephrolithiasis   . Osteoporosis   . Paroxysmal SVT (supraventricular tachycardia) ([place]) 2014  . Thyroid disease    No past surgical history on file.  Current Outpatient Prescriptions  Medication Sig Dispense Refill  . apixaban (ELIQUIS) 2.5 MG TABS tablet  Take 1 tablet (2.5 mg total) by mouth 2 (two) times daily. 60 tablet 6  . diazepam (VALIUM) 5 MG tablet Take 2.5 mg by mouth daily as needed for anxiety.     Marland Kitchen levothyroxine (SYNTHROID, LEVOTHROID) 75 MCG tablet Take 75 mcg by mouth daily before breakfast.    . lisinopril (PRINIVIL,ZESTRIL) 5 MG tablet Take 1 tablet (5 mg total) by mouth daily. 30 tablet 5  . metoprolol tartrate (LOPRESSOR) 25 MG tablet Take 1 tablet (25 mg total) by mouth 2 (two) times daily. Take 1 tablet in the morning and 1/2 tablet in the evening (Patient taking differently: Take 25 mg by mouth 2 (two) times daily. ) 180 tablet 3  . nitroGLYCERIN (NITROSTAT) 0.4 MG SL tablet Place 1 tablet (0.4 mg total) under the tongue every 5 (five) minutes as needed for chest pain. 90 tablet 3   No current facility-administered medications for this encounter.     No Known Allergies  Social History   Social History  . Marital status: Married    Spouse name: N/A  . Number of children: 1  . Years of education: N/A   Occupational History  . Not on file.   Social History Main Topics  . Smoking status: Former Smoker    Types: Cigarettes    Quit date: 05/18/1969  . Smokeless tobacco: Never Used  . Alcohol use No  . Drug use: No  . Sexual activity: Not on file   Other Topics Concern  .  Not on file   Social History Narrative   Lives in [place] with her spouse.  Retired    Family History  Problem Relation Age of Onset  . Breast cancer Mother   . Lung cancer Father     ROS- All systems are reviewed and negative except as per the HPI above  Physical Exam: Vitals:   04/30/17 0835  BP: (!) 112/58  Pulse: (!) 109  Weight: 123 lb (55.8 kg)  Height: 5' 0.25" (1.53 m)   Wt Readings from Last 3 Encounters:  04/30/17 123 lb (55.8 kg)  04/18/17 118 lb 3.2 oz (53.6 kg)  04/16/17 118 lb (53.5 kg)    Labs: Lab Results  Component Value Date   NA 134 (L) 04/30/2017   K 4.8 04/30/2017   CL 104 04/30/2017   CO2 24  04/30/2017   GLUCOSE 90 04/30/2017   BUN 26 (H) 04/30/2017   CREATININE 1.16 (H) 04/30/2017   CALCIUM 8.9 04/30/2017   No results found for: INR No results found for: CHOL, HDL, LDLCALC, TRIG   GEN- The patient is well appearing, alert and oriented x 3 today.   Head- normocephalic, atraumatic Eyes-  Sclera clear, conjunctiva pink Ears- hearing intact Oropharynx- clear Neck- supple, no JVP Lymph- no cervical lymphadenopathy Lungs- Clear to ausculation bilaterally, normal work of breathing Heart- Rapid irregular  rate and rhythm, no murmurs, rubs or gallops, PMI not laterally displaced GI- soft, NT, ND, + BS Extremities- no clubbing, cyanosis, or edema MS- no significant deformity or atrophy Skin- no rash or lesion Psych- euthymic mood, full affect Neuro- strength and sensation are intact  EKG-afib at 109 bpm, qrs int 120 ms, qtc 430 ms Epic records reviewed   Assessment and Plan: 1. Afib with rvr Continue metoprolol tartrate  25 mg am and 25 mg pm Continue eliquis 2.5 mg bid for age over 38, weight less than 60 kg and creatinine at 1.17, for chadsvasc score of at least 4, consistently taking since 5/2.  Given options of cardioversion, risk vrs benefit and pt wants to proceed with cardioversion. Importance of not missing any Eliquis stressed.  H/o  tachy/brady When cardioverted, will have to hold BB's for that am and then reduce dose for brady when is SR. She will return to 1/2 tab metoprolol tartrate in the am and 1/4 tab in the pm. Scheduled for cardioversion 5/29 Bmet/mag drawn today    Butch Penny C. Leda Bellefeuille, [facility] [address] [place], [place] [address] [phone]

## 2017-05-14 NOTE — Transfer of Care (Signed)
Immediate Anesthesia Transfer of Care Note  Patient: Brandi Chambers  Procedure(s) Performed: Procedure(s): CARDIOVERSION (N/A)  Patient Location: Endoscopy Unit  Anesthesia Type:MAC  Level of Consciousness: awake, alert  and oriented  Airway & Oxygen Therapy: Patient Spontanous Breathing  Post-op Assessment: Report given to RN and Post -op Vital signs reviewed and stable  Post vital signs: Reviewed and stable  Last Vitals:  Vitals:   05/14/17 1320 05/14/17 1330  BP: 135/72 136/76  Pulse: (!) 49   Resp: 12   Temp:      Last Pain:  Vitals:   05/14/17 1307  TempSrc: Oral         Complications: No apparent anesthesia complications

## 2017-05-14 NOTE — CV Procedure (Signed)
Electrical Cardioversion Procedure Note Brandi Chambers [id number] [birth year]  Procedure: Electrical Cardioversion Indications:  Atrial Fibrillation  Procedure Details Consent: Risks of procedure as well as the alternatives and risks of each were explained to the (patient/caregiver).  Consent for procedure obtained. Time Out: Verified patient identification, verified procedure, site/side was marked, verified correct patient position, special equipment/implants available, medications/allergies/relevent history reviewed, required imaging and test results available.  Performed  Patient placed on cardiac monitor, pulse oximetry, supplemental oxygen as necessary.  Sedation given: propofol Pacer pads placed anterior and posterior chest.  Cardioverted 1 time(s).  Cardioverted at 150J.  Evaluation Findings: Post procedure EKG shows: sinus rhythm with PACs Complications: None Patient did tolerate procedure well.   [name], MD 05/14/2017, 1:12 PM

## 2017-05-14 NOTE — Anesthesia Preprocedure Evaluation (Addendum)
Anesthesia Evaluation  Patient identified by MRN, date of birth, ID band Patient awake    Reviewed: Allergy & Precautions, NPO status , Patient's Chart, lab work & pertinent test results  Airway Mallampati: I  TM Distance: >3 FB Neck ROM: Full    Dental no notable dental hx.    Pulmonary neg pulmonary ROS, former smoker,    Pulmonary exam normal breath sounds clear to auscultation       Cardiovascular hypertension, Pt. on medications and Pt. on home beta blockers Normal cardiovascular exam+ dysrhythmias Atrial Fibrillation  Rhythm:Regular Rate:Normal  ECG: A-fib with RVR, rate 109 ECHO: - Left ventricle: The cavity size was normal. Systolic function was mildly reduced. The estimated ejection fraction was in the range   of 45% to 50%. Wall motion was normal; there were no regional wall motion abnormalities. The study is not technically sufficient to allow evaluation of LV diastolic function. - Aortic valve: There was mild regurgitation. - Mitral valve: Moderately calcified annulus. Mildly thickened   leaflets . There was mild regurgitation. - Left atrium: The atrium was mildly dilated. - Right atrium: The atrium was mildly dilated. - Tricuspid valve: There was moderate regurgitation. - Pericardium, extracardiac: A small pericardial effusion was identified posterior to the heart. Features were not consistent with tamponade physiology.   Neuro/Psych negative neurological ROS  negative psych ROS   GI/Hepatic negative GI ROS, Neg liver ROS,   Endo/Other  Hypothyroidism   Renal/GU negative Renal ROS  negative genitourinary   Musculoskeletal negative musculoskeletal ROS (+)   Abdominal   Peds negative pediatric ROS (+)  Hematology negative hematology ROS (+)   Anesthesia Other Findings   Reproductive/Obstetrics negative OB ROS                            Anesthesia Physical Anesthesia  Plan  ASA: III  Anesthesia Plan: General   Post-op Pain Management:    Induction: Intravenous  Airway Management Planned: Simple Face Mask  Additional Equipment:   Intra-op Plan:   Post-operative Plan:   Informed Consent: I have reviewed the patients History and Physical, chart, labs and discussed the procedure including the risks, benefits and alternatives for the proposed anesthesia with the patient or authorized representative who has indicated his/her understanding and acceptance.   Dental advisory given  Plan Discussed with: CRNA  Anesthesia Plan Comments:         Anesthesia Quick Evaluation

## 2017-05-14 NOTE — Interval H&P Note (Signed)
History and Physical Interval Note:  05/14/2017 12:58 PM  Brandi Chambers  has presented today for surgery, with the diagnosis of A-FIB  The various methods of treatment have been discussed with the patient and family. After consideration of risks, benefits and other options for treatment, the patient has consented to  Procedure(s): CARDIOVERSION (N/A) as a surgical intervention .  The patient's history has been reviewed, patient examined, no change in status, stable for surgery.  I have reviewed the patient's chart and labs.  Questions were answered to the patient's satisfaction.     [name], MD

## 2017-05-14 NOTE — Anesthesia Postprocedure Evaluation (Signed)
Anesthesia Post Note  Patient: Brandi Chambers  Procedure(s) Performed: Procedure(s) (LRB): CARDIOVERSION (N/A)  Patient location during evaluation: PACU Anesthesia Type: General Level of consciousness: awake and alert Pain management: pain level controlled Vital Signs Assessment: post-procedure vital signs reviewed and stable Respiratory status: spontaneous breathing, nonlabored ventilation, respiratory function stable and patient connected to nasal cannula oxygen Cardiovascular status: blood pressure returned to baseline and stable Postop Assessment: no signs of nausea or vomiting Anesthetic complications: no       Last Vitals:  Vitals:   05/14/17 1320 05/14/17 1330  BP: 135/72 136/76  Pulse: (!) 49   Resp: 12   Temp:      Last Pain:  Vitals:   05/14/17 1307  TempSrc: Oral                 Shaunessy Dobratz P Rosabel Sermeno

## 2017-05-15 ENCOUNTER — Encounter (HOSPITAL_COMMUNITY): Payer: Self-pay | Admitting: Cardiovascular Disease

## 2017-05-21 ENCOUNTER — Ambulatory Visit (HOSPITAL_COMMUNITY)
Admission: RE | Admit: 2017-05-21 | Discharge: 2017-05-21 | Disposition: A | Discharge status: Home / Self Care | Payer: PPO | Source: Ambulatory Visit | Attending: Nurse Practitioner | Admitting: Nurse Practitioner

## 2017-05-21 ENCOUNTER — Encounter (HOSPITAL_COMMUNITY): Payer: Self-pay | Admitting: Nurse Practitioner

## 2017-05-21 VITALS — BP 122/74 | HR 142 | Ht 64.25 in | Wt 121.2 lb

## 2017-05-21 DIAGNOSIS — Z87891 Personal history of nicotine dependence: Secondary | ICD-10-CM | POA: Insufficient documentation

## 2017-05-21 DIAGNOSIS — I471 Supraventricular tachycardia: Secondary | ICD-10-CM | POA: Diagnosis not present

## 2017-05-21 DIAGNOSIS — I10 Essential (primary) hypertension: Secondary | ICD-10-CM | POA: Insufficient documentation

## 2017-05-21 DIAGNOSIS — E079 Disorder of thyroid, unspecified: Secondary | ICD-10-CM | POA: Insufficient documentation

## 2017-05-21 DIAGNOSIS — Z7901 Long term (current) use of anticoagulants: Secondary | ICD-10-CM | POA: Insufficient documentation

## 2017-05-21 DIAGNOSIS — I4891 Unspecified atrial fibrillation: Secondary | ICD-10-CM | POA: Insufficient documentation

## 2017-05-21 NOTE — Progress Notes (Signed)
Primary Care Physician: [name], MD Referring Physician: [name], PA EP [name] is a 83 y.o. female with a h/o SVT  treated by Dr. [name] in August 2016 that is in the afib clinic today at the referral of [name], NP, for  afib with rvr on 4/27. She was asked to start eliquis 5 mg bid but she has not started "scared of those drugs." BB was increased to 12.5 mg bid.HR today is 147 bpm, and she appears to be tolerating well. She also has h/o of tachy/brady syndrome, slow when she is in SR. Chadsvasc score is at least 4.  Returns 5/3, afib now at 124 bpm. She is tolerating increase in BB. States her readings at home are lower, if accurate. Will have to be cautious of brady when she returns to [place]. Other than feeling tired, she is tolearating ok. Weight is stable. She has started eliquis. Weight is stable. Pending echo update 5/4.  Returns 5/15 and continues in afib with at rest HR controlled around 100 bpm. She is not symptomatic at rest, notices increase in dyspnea with exertion. She has been taking eliquis consistently since 5/2 without missed doses at 2.5 mg bid. Pt was cardioverted 5/29 and did enjoy SR for 2-3 days before she noted her heart rate increase. She felt much better in SR and did notice less swelling of her lower extremities.Options to restore SR with AAD's discussed today. HR initially in the 140's but on resting around 100 bpm, which is what pt sees a home.  Today, she denies symptoms of palpitations, chest pain, shortness of breath, orthopnea, PND, lower extremity edema, dizziness, presyncope, syncope, or neurologic sequela. Positive for some mild fatigue/exertional dyspnea.The patient is tolerating medications without difficulties and is otherwise without complaint today.   Past Medical History:  Diagnosis Date  . Chest pain   . Echocardiogram abnormal 2014   grade 2 diastolic dysfunction, EF 96-29%, mild AR, mitral anular calcification  with mild MR   . Hypertension   . Hypoglycemia   . Nephrolithiasis   . Osteoporosis   . Paroxysmal SVT (supraventricular tachycardia) ([place]) 2014  . Thyroid disease    Past Surgical History:  Procedure Laterality Date  . CARDIOVERSION N/A 05/14/2017   Procedure: CARDIOVERSION;  Surgeon: [name], MD;  Location: [place];  Service: Cardiovascular;  Laterality: N/A;    Current Outpatient Prescriptions  Medication Sig Dispense Refill  . apixaban (ELIQUIS) 2.5 MG TABS tablet Take 1 tablet (2.5 mg total) by mouth 2 (two) times daily. 60 tablet 6  . diazepam (VALIUM) 5 MG tablet Take 2.5 mg by mouth daily as needed for anxiety.     Marland Kitchen levothyroxine (SYNTHROID, LEVOTHROID) 75 MCG tablet Take 75 mcg by mouth daily before breakfast.    . lisinopril (PRINIVIL,ZESTRIL) 5 MG tablet Take 1 tablet (5 mg total) by mouth daily. 30 tablet 5  . metoprolol tartrate (LOPRESSOR) 25 MG tablet Take 0.5 tablet in the morning and 0.5 tablet in the afternoon    . nitroGLYCERIN (NITROSTAT) 0.4 MG SL tablet Place 1 tablet (0.4 mg total) under the tongue every 5 (five) minutes as needed for chest pain. 90 tablet 3   No current facility-administered medications for this encounter.     No Known Allergies  Social History   Social History  . Marital status: Married    Spouse name: N/A  . Number of children: 1  . Years of education: N/A   Occupational  History  . Not on file.   Social History Main Topics  . Smoking status: Former Smoker    Types: Cigarettes    Quit date: 05/18/1969  . Smokeless tobacco: Never Used  . Alcohol use No  . Drug use: No  . Sexual activity: Not on file   Other Topics Concern  . Not on file   Social History Narrative   Lives in [place] with her spouse.  Retired    Family History  Problem Relation Age of Onset  . Breast cancer Mother   . Lung cancer Father     ROS- All systems are reviewed and negative except as per the HPI above  Physical Exam: Vitals:     05/21/17 0905  BP: 122/74  Pulse: (!) 142  Weight: 121 lb 3.2 oz (55 kg)  Height: 5' 4.25" (1.632 m)   Wt Readings from Last 3 Encounters:  05/21/17 121 lb 3.2 oz (55 kg)  05/14/17 123 lb (55.8 kg)  04/30/17 123 lb (55.8 kg)    Labs: Lab Results  Component Value Date   NA 140 05/14/2017   K 5.0 05/14/2017   CL 104 04/30/2017   CO2 24 04/30/2017   GLUCOSE 83 05/14/2017   BUN 26 (H) 04/30/2017   CREATININE 1.16 (H) 04/30/2017   CALCIUM 8.9 04/30/2017   No results found for: INR No results found for: CHOL, HDL, LDLCALC, TRIG   GEN- The patient is well appearing, alert and oriented x 3 today.   Head- normocephalic, atraumatic Eyes-  Sclera clear, conjunctiva pink Ears- hearing intact Oropharynx- clear Neck- supple, no JVP Lymph- no cervical lymphadenopathy Lungs- Clear to ausculation bilaterally, normal work of breathing Heart- Rapid irregular  rate and rhythm, no murmurs, rubs or gallops, PMI not laterally displaced GI- soft, NT, ND, + BS Extremities- no clubbing, cyanosis, or edema MS- no significant deformity or atrophy Skin- no rash or lesion Psych- euthymic mood, full affect Neuro- strength and sensation are intact  EKG-afib at 142 bpm, qrs int 110 ms, qtc 507 ms Epic records reviewed   Assessment and Plan: 1. Afib with rvr Continue metoprolol tartrate 12.5 mg am and pm, she did not feel well on higher doses and had brady when in SR. Continue eliquis 2.5 mg bid for age over 51, weight less than 60 kg and creatinine at 1.17, for chadsvasc score of at least 4, consistently taking since 5/2.  Given options of antiarrythmic drugs crcl cal at 32 would make her not eligible for sotalol(would not like to use for slowing of HR)  If tikosyn used would only be able to take the 125 mcg bid dose, qtc in SR looks a acceptable at 443. No current qtc prolonging drugs on board. Amiodarone discussed, pt not crazy about the side effect profile, but does like th option of being  started outpt  She will discuss with son and husband and check on the price of dofetilide and will get back to me re her choice I think being an older small framed female with crcl of 32, I would like to avoid flecainide Scheduled for cardioversion 5/29 Bmet/mag drawn today    Brandi Chambers, [facility] [address] [place],  [id number] [phone]

## 2017-05-22 ENCOUNTER — Other Ambulatory Visit (HOSPITAL_COMMUNITY): Payer: Self-pay | Admitting: *Deleted

## 2017-05-22 MED ORDER — AMIODARONE HCL 200 MG PO TABS: 200.0000 mg | ORAL_TABLET | Freq: Two times a day (BID) | ORAL | 0 refills | Status: DC

## 2017-05-22 NOTE — Telephone Encounter (Signed)
Pt spoke with Roderic Palau, NP and was advised per consult with Dr. [name] pt could start amiodarone load taking 200 mg bid for 2 weeks and then switching to 200 mg QD with a dccv in one month.  Pt was agreeable to starting this.  Rx sent into the pharmacy

## 2017-05-29 ENCOUNTER — Encounter (HOSPITAL_COMMUNITY): Payer: Self-pay | Admitting: Nurse Practitioner

## 2017-05-29 ENCOUNTER — Ambulatory Visit (HOSPITAL_COMMUNITY)
Admission: RE | Admit: 2017-05-29 | Discharge: 2017-05-29 | Disposition: A | Discharge status: Home / Self Care | Payer: PPO | Source: Ambulatory Visit | Attending: Nurse Practitioner | Admitting: Nurse Practitioner

## 2017-05-29 DIAGNOSIS — Z79899 Other long term (current) drug therapy: Secondary | ICD-10-CM | POA: Insufficient documentation

## 2017-05-29 DIAGNOSIS — R42 Dizziness and giddiness: Secondary | ICD-10-CM | POA: Insufficient documentation

## 2017-05-29 MED ORDER — METOPROLOL SUCCINATE ER 25 MG PO TB24: 12.5000 mg | ORAL_TABLET | Freq: Every day | ORAL | 6 refills | Status: DC

## 2017-05-29 MED ORDER — AMIODARONE HCL 200 MG PO TABS: 200.0000 mg | ORAL_TABLET | Freq: Every day | ORAL | 6 refills | Status: DC

## 2017-05-29 NOTE — Patient Instructions (Signed)
Your physician has recommended you make the following change in your medication:  1)Stop metoprolol (lopressor) 2)Start Metoprolol (toprol) 1/2 tablet at bedtime 3)Amiodarone 200mg  once a day

## 2017-05-29 NOTE — Progress Notes (Addendum)
[name] for repeat EKG after starting amiodarone.  EKG to be reviewed by Roderic Palau, NP  Pt returns after starting amiodarone and thinks she converted Saturday pm as she became very lightheaded. She reduced amiodarone to 1 tab a day and reduced metoprolol tartrate to 1/2 tab bid. SR in the low 50's, has a tendency for bradycardia. She feels better but is still fatigued. Will change metoprolol to succinate 25 mg 1/2 tab at HS to see if tolerates better. F/u with BP and EKG in one week.

## 2017-06-04 ENCOUNTER — Encounter (HOSPITAL_COMMUNITY): Payer: Self-pay | Admitting: Nurse Practitioner

## 2017-06-04 ENCOUNTER — Ambulatory Visit (HOSPITAL_COMMUNITY)
Admission: RE | Admit: 2017-06-04 | Discharge: 2017-06-04 | Disposition: A | Discharge status: Home / Self Care | Payer: PPO | Source: Ambulatory Visit | Attending: Nurse Practitioner | Admitting: Nurse Practitioner

## 2017-06-04 VITALS — HR 47

## 2017-06-04 DIAGNOSIS — Z9889 Other specified postprocedural states: Secondary | ICD-10-CM | POA: Insufficient documentation

## 2017-06-04 DIAGNOSIS — I495 Sick sinus syndrome: Secondary | ICD-10-CM

## 2017-06-04 DIAGNOSIS — Z87891 Personal history of nicotine dependence: Secondary | ICD-10-CM | POA: Insufficient documentation

## 2017-06-04 DIAGNOSIS — I471 Supraventricular tachycardia: Secondary | ICD-10-CM | POA: Insufficient documentation

## 2017-06-04 DIAGNOSIS — I10 Essential (primary) hypertension: Secondary | ICD-10-CM | POA: Diagnosis not present

## 2017-06-04 DIAGNOSIS — Z801 Family history of malignant neoplasm of trachea, bronchus and lung: Secondary | ICD-10-CM | POA: Diagnosis not present

## 2017-06-04 DIAGNOSIS — R0600 Dyspnea, unspecified: Secondary | ICD-10-CM

## 2017-06-04 DIAGNOSIS — I4510 Unspecified right bundle-branch block: Secondary | ICD-10-CM | POA: Insufficient documentation

## 2017-06-04 DIAGNOSIS — R0609 Other forms of dyspnea: Secondary | ICD-10-CM | POA: Diagnosis not present

## 2017-06-04 DIAGNOSIS — I4891 Unspecified atrial fibrillation: Secondary | ICD-10-CM | POA: Diagnosis not present

## 2017-06-04 DIAGNOSIS — Z803 Family history of malignant neoplasm of breast: Secondary | ICD-10-CM | POA: Insufficient documentation

## 2017-06-04 DIAGNOSIS — Z79899 Other long term (current) drug therapy: Secondary | ICD-10-CM | POA: Insufficient documentation

## 2017-06-04 DIAGNOSIS — E079 Disorder of thyroid, unspecified: Secondary | ICD-10-CM | POA: Diagnosis not present

## 2017-06-04 MED ORDER — AMIODARONE HCL 200 MG PO TABS: 100.0000 mg | ORAL_TABLET | Freq: Every day | ORAL | 6 refills | Status: DC

## 2017-06-04 NOTE — Progress Notes (Signed)
Primary Care Physician: [name], MD Referring Physician: [name], PA EP [name] is a 83 y.o. female with a h/o SVT  treated by Dr. [name] in August 2016 that is in the afib clinic today at the referral of [name], NP, for  afib with rvr on 4/27. She was asked to start eliquis 5 mg bid but she has not started "scared of those drugs." BB was increased to 12.5 mg bid.HR today is 147 bpm, and she appears to be tolerating well. She also has h/o of tachy/brady syndrome, slow when she is in SR. Chadsvasc score is at least 4.  Returns 5/3, afib now at 124 bpm. She is tolerating increase in BB. States her readings at home are lower, if accurate. Will have to be cautious of brady when she returns to [place]. Other than feeling tired, she is tolearating ok. Weight is stable. She has started eliquis. Weight is stable. Pending echo update 5/4.  Returns 5/15 and continues in afib with at rest HR controlled around 100 bpm. She is not symptomatic at rest, notices increase in dyspnea with exertion. She has been taking eliquis consistently since 5/2 without missed doses at 2.5 mg bid. Pt was cardioverted 5/29 and did enjoy SR for 2-3 days before she noted her heart rate increase. She felt much better in SR and did notice less swelling of her lower extremities.Options to restore SR with AAD's discussed today. HR initially in the 140's but on resting around 100 bpm, which is what pt sees a home.  F/u in afib clinic. Pt chose to try amiodarone for restoring SR. She  converted after a few days on amiodarone but had SVR. I decreased her BB and she was to come back today for repeat EKG.She has noted increase of shortness of breath since going into afib early MAy but she feels that the shortness of breath has been worse over the last few weeks. She feels fine at rest. EKG  shows now afib with SVR at 47 bpm. She was noted to have exertional dyspnea with walking back to exam room,but PO at  100%. .   Today, she denies symptoms of palpitations, chest pain, shortness of breath, orthopnea, PND, lower extremity edema, dizziness, presyncope, syncope, or neurologic sequela. Positive for some mild fatigue/exertional dyspnea.The patient is tolerating medications without difficulties and is otherwise without complaint today.   Past Medical History:  Diagnosis Date  . Chest pain   . Echocardiogram abnormal 2014   grade 2 diastolic dysfunction, EF 49-44%, mild AR, mitral anular calcification with mild MR   . Hypertension   . Hypoglycemia   . Nephrolithiasis   . Osteoporosis   . Paroxysmal SVT (supraventricular tachycardia) ([place]) 2014  . Thyroid disease    Past Surgical History:  Procedure Laterality Date  . CARDIOVERSION N/A 05/14/2017   Procedure: CARDIOVERSION;  Surgeon: [name], MD;  Location: [place];  Service: Cardiovascular;  Laterality: N/A;    Current Outpatient Prescriptions  Medication Sig Dispense Refill  . amiodarone (PACERONE) 200 MG tablet Take 0.5 tablets (100 mg total) by mouth daily. 30 tablet 6  . apixaban (ELIQUIS) 2.5 MG TABS tablet Take 1 tablet (2.5 mg total) by mouth 2 (two) times daily. 60 tablet 6  . diazepam (VALIUM) 5 MG tablet Take 2.5 mg by mouth daily as needed for anxiety.     Marland Kitchen levothyroxine (SYNTHROID, LEVOTHROID) 75 MCG tablet Take 75 mcg by mouth daily before breakfast.    .  lisinopril (PRINIVIL,ZESTRIL) 5 MG tablet Take 1 tablet (5 mg total) by mouth daily. 30 tablet 5  . nitroGLYCERIN (NITROSTAT) 0.4 MG SL tablet Place 1 tablet (0.4 mg total) under the tongue every 5 (five) minutes as needed for chest pain. 90 tablet 3   No current facility-administered medications for this encounter.     No Known Allergies  Social History   Social History  . Marital status: Married    Spouse name: N/A  . Number of children: 1  . Years of education: N/A   Occupational History  . Not on file.   Social History Main Topics  . Smoking  status: Former Smoker    Types: Cigarettes    Quit date: 05/18/1969  . Smokeless tobacco: Never Used  . Alcohol use No  . Drug use: No  . Sexual activity: Not on file   Other Topics Concern  . Not on file   Social History Narrative   Lives in [place] with her spouse.  Retired    Family History  Problem Relation Age of Onset  . Breast cancer Mother   . Lung cancer Father     ROS- All systems are reviewed and negative except as per the HPI above  Physical Exam: Vitals:   06/04/17 1019  Pulse: (!) 47  SpO2: 97%   Wt Readings from Last 3 Encounters:  05/21/17 121 lb 3.2 oz (55 kg)  05/14/17 123 lb (55.8 kg)  04/30/17 123 lb (55.8 kg)    Labs: Lab Results  Component Value Date   NA 140 05/14/2017   K 5.0 05/14/2017   CL 104 04/30/2017   CO2 24 04/30/2017   GLUCOSE 83 05/14/2017   BUN 26 (H) 04/30/2017   CREATININE 1.16 (H) 04/30/2017   CALCIUM 8.9 04/30/2017   No results found for: INR No results found for: CHOL, HDL, LDLCALC, TRIG   GEN- The patient is well appearing, alert and oriented x 3 today.   Head- normocephalic, atraumatic Eyes-  Sclera clear, conjunctiva pink Ears- hearing intact Oropharynx- clear Neck- supple, no JVP Lymph- no cervical lymphadenopathy Lungs- Clear to ausculation bilaterally, normal work of breathing Heart- Slow mildly irregular rate and rhythm, no murmurs, rubs or gallops, PMI not laterally displaced GI- soft, NT, ND, + BS Extremities- no clubbing, cyanosis, or edema MS- no significant deformity or atrophy Skin- no rash or lesion Psych- euthymic mood, full affect Neuro- strength and sensation are intact  EKG-Slow afib at  47 bpm, qrs int 120 ms, qtc 447 ms Epic records reviewed   Assessment and Plan: 1. Afib with rvr Now with afib with sinus brady since starting amiodarone that initially converted her to SR. Worrisome worsening of exertional dyspnea, no lightheadedness. Fluid status normal, pulse ox normal ? from brady  and/or amiodarone Stop BB Decrease amiodarone to 100 mg in the am May have tachy/brady going on, Dr. [name] has discussed the possibility of PPM in the past Consider stress test if DOE does not improve If does not improve, will refer back to Dr. [name] for evaluation F/u Thursday for repeat EKG and further eval of symptoms   Butch Penny C. Lorae Roig, [facility] [address] [place], [place] [address] [phone]

## 2017-06-04 NOTE — Progress Notes (Signed)
[name] for repeat EKG today.  Pt has SOB with walking.  O2 sat 97% sitting after exertion.  EKG to be reviewed by Roderic Palau, NP

## 2017-06-04 NOTE — Patient Instructions (Addendum)
Your physician has recommended you make the following change in your medication:  1)Stop metoprolol 2)Decrease Amiodarone to 1/2 tablet once a day

## 2017-06-05 ENCOUNTER — Encounter (HOSPITAL_COMMUNITY): Payer: PPO | Admitting: Nurse Practitioner

## 2017-06-06 ENCOUNTER — Encounter (HOSPITAL_COMMUNITY): Payer: Self-pay | Admitting: *Deleted

## 2017-06-06 ENCOUNTER — Other Ambulatory Visit: Payer: Self-pay | Admitting: Nurse Practitioner

## 2017-06-06 ENCOUNTER — Ambulatory Visit (HOSPITAL_COMMUNITY)
Admission: RE | Admit: 2017-06-06 | Discharge: 2017-06-07 | Disposition: A | Discharge status: Home / Self Care | Payer: PPO | Source: Ambulatory Visit | Attending: Internal Medicine | Admitting: Internal Medicine

## 2017-06-06 ENCOUNTER — Ambulatory Visit (HOSPITAL_BASED_OUTPATIENT_CLINIC_OR_DEPARTMENT_OTHER)
Admission: RE | Admit: 2017-06-06 | Discharge: 2017-06-06 | Disposition: A | Discharge status: Home / Self Care | Payer: PPO | Source: Ambulatory Visit | Attending: Nurse Practitioner | Admitting: Nurse Practitioner

## 2017-06-06 ENCOUNTER — Encounter (HOSPITAL_COMMUNITY): Payer: Self-pay | Admitting: Nurse Practitioner

## 2017-06-06 ENCOUNTER — Ambulatory Visit (HOSPITAL_COMMUNITY)
Admission: RE | Disposition: A | Discharge status: Home / Self Care | Payer: Self-pay | Source: Ambulatory Visit | Attending: Internal Medicine

## 2017-06-06 VITALS — BP 152/78 | HR 38

## 2017-06-06 DIAGNOSIS — I110 Hypertensive heart disease with heart failure: Secondary | ICD-10-CM | POA: Diagnosis not present

## 2017-06-06 DIAGNOSIS — I483 Typical atrial flutter: Secondary | ICD-10-CM | POA: Insufficient documentation

## 2017-06-06 DIAGNOSIS — E079 Disorder of thyroid, unspecified: Secondary | ICD-10-CM | POA: Insufficient documentation

## 2017-06-06 DIAGNOSIS — I495 Sick sinus syndrome: Secondary | ICD-10-CM | POA: Diagnosis not present

## 2017-06-06 DIAGNOSIS — Z7901 Long term (current) use of anticoagulants: Secondary | ICD-10-CM | POA: Insufficient documentation

## 2017-06-06 DIAGNOSIS — Z79899 Other long term (current) drug therapy: Secondary | ICD-10-CM | POA: Insufficient documentation

## 2017-06-06 DIAGNOSIS — I471 Supraventricular tachycardia: Secondary | ICD-10-CM | POA: Insufficient documentation

## 2017-06-06 DIAGNOSIS — I10 Essential (primary) hypertension: Secondary | ICD-10-CM

## 2017-06-06 DIAGNOSIS — I5032 Chronic diastolic (congestive) heart failure: Secondary | ICD-10-CM | POA: Insufficient documentation

## 2017-06-06 DIAGNOSIS — R40241 Glasgow coma scale score 13-15, unspecified time: Secondary | ICD-10-CM | POA: Insufficient documentation

## 2017-06-06 DIAGNOSIS — Z959 Presence of cardiac and vascular implant and graft, unspecified: Secondary | ICD-10-CM

## 2017-06-06 DIAGNOSIS — I441 Atrioventricular block, second degree: Secondary | ICD-10-CM | POA: Diagnosis not present

## 2017-06-06 DIAGNOSIS — Z87891 Personal history of nicotine dependence: Secondary | ICD-10-CM | POA: Insufficient documentation

## 2017-06-06 DIAGNOSIS — I481 Persistent atrial fibrillation: Secondary | ICD-10-CM | POA: Insufficient documentation

## 2017-06-06 DIAGNOSIS — R001 Bradycardia, unspecified: Secondary | ICD-10-CM

## 2017-06-06 DIAGNOSIS — M810 Age-related osteoporosis without current pathological fracture: Secondary | ICD-10-CM | POA: Insufficient documentation

## 2017-06-06 HISTORY — DX: Chronic diastolic (congestive) heart failure: I50.32

## 2017-06-06 HISTORY — DX: Typical atrial flutter: I48.3

## 2017-06-06 HISTORY — DX: Other persistent atrial fibrillation: I48.19

## 2017-06-06 HISTORY — PX: PACEMAKER IMPLANT: EP1218

## 2017-06-06 HISTORY — DX: Unspecified osteoarthritis, unspecified site: M19.90

## 2017-06-06 LAB — CBC
HCT: 39.8 % (ref 36.0–46.0)
Hemoglobin: 13 g/dL (ref 12.0–15.0)
MCH: 28 pg (ref 26.0–34.0)
MCHC: 32.7 g/dL (ref 30.0–36.0)
MCV: 85.8 fL (ref 78.0–100.0)
Platelets: 241 10*3/uL (ref 150–400)
RBC: 4.64 MIL/uL (ref 3.87–5.11)
RDW: 14.1 % (ref 11.5–15.5)
WBC: 8.4 10*3/uL (ref 4.0–10.5)

## 2017-06-06 LAB — BASIC METABOLIC PANEL
Anion gap: 8 (ref 5–15)
BUN: 24 mg/dL — ABNORMAL HIGH (ref 6–20)
CO2: 22 mmol/L (ref 22–32)
Calcium: 8.9 mg/dL (ref 8.9–10.3)
Chloride: 105 mmol/L (ref 101–111)
Creatinine, Ser: 1.44 mg/dL — ABNORMAL HIGH (ref 0.44–1.00)
GFR calc Af Amer: 38 mL/min — ABNORMAL LOW (ref >60)
GFR calc non Af Amer: 33 mL/min — ABNORMAL LOW (ref >60)
Glucose, Bld: 98 mg/dL (ref 65–99)
Potassium: 5.4 mmol/L — ABNORMAL HIGH (ref 3.5–5.1)
Sodium: 135 mmol/L (ref 135–145)

## 2017-06-06 LAB — SURGICAL PCR SCREEN
MRSA, PCR: NEGATIVE
Staphylococcus aureus: NEGATIVE

## 2017-06-06 SURGERY — PACEMAKER IMPLANT

## 2017-06-06 MED ORDER — LIDOCAINE HCL (PF) 1 % IJ SOLN
INTRAMUSCULAR | Status: AC
  Filled 2017-06-06: qty 60

## 2017-06-06 MED ORDER — SODIUM CHLORIDE 0.9 % IR SOLN
80.0000 mg | Status: AC
  Administered 2017-06-06: 80 mg
  Filled 2017-06-06: qty 2

## 2017-06-06 MED ORDER — YOU HAVE A PACEMAKER BOOK
Freq: Once | Status: AC
  Administered 2017-06-06: 21:00:00
  Filled 2017-06-06: qty 1

## 2017-06-06 MED ORDER — DIAZEPAM 5 MG PO TABS: 2.5000 mg | ORAL_TABLET | Freq: Every day | ORAL | Status: DC | PRN

## 2017-06-06 MED ORDER — ONDANSETRON HCL 4 MG/2ML IJ SOLN
INTRAMUSCULAR | Status: DC | PRN
  Administered 2017-06-06: 4 mg via INTRAVENOUS

## 2017-06-06 MED ORDER — HEPARIN (PORCINE) IN NACL 2-0.9 UNIT/ML-% IJ SOLN
INTRAMUSCULAR | Status: AC
  Filled 2017-06-06: qty 1000

## 2017-06-06 MED ORDER — ONDANSETRON HCL 4 MG/2ML IJ SOLN: 4.0000 mg | Freq: Four times a day (QID) | INTRAMUSCULAR | Status: DC | PRN

## 2017-06-06 MED ORDER — LEVOTHYROXINE SODIUM 75 MCG PO TABS: 75.0000 ug | ORAL_TABLET | Freq: Every day | ORAL | Status: DC

## 2017-06-06 MED ORDER — MUPIROCIN 2 % EX OINT
TOPICAL_OINTMENT | CUTANEOUS | Status: AC
  Administered 2017-06-06: 1
  Filled 2017-06-06: qty 22

## 2017-06-06 MED ORDER — SODIUM CHLORIDE 0.9 % IV SOLN: 250.0000 mL | INTRAVENOUS | Status: DC | PRN

## 2017-06-06 MED ORDER — HEPARIN (PORCINE) IN NACL 2-0.9 UNIT/ML-% IJ SOLN
INTRAMUSCULAR | Status: AC | PRN
  Administered 2017-06-06: 500 mL

## 2017-06-06 MED ORDER — ACETAMINOPHEN 325 MG PO TABS: 325.0000 mg | ORAL_TABLET | ORAL | Status: DC | PRN

## 2017-06-06 MED ORDER — IOPAMIDOL (ISOVUE-370) INJECTION 76%
INTRAVENOUS | Status: DC | PRN
  Administered 2017-06-06: 15 mL via INTRAVENOUS

## 2017-06-06 MED ORDER — CEFAZOLIN SODIUM-DEXTROSE 1-4 GM/50ML-% IV SOLN
1.0000 g | Freq: Four times a day (QID) | INTRAVENOUS | Status: AC
  Administered 2017-06-06 – 2017-06-07 (×3): 1 g via INTRAVENOUS
  Filled 2017-06-06 (×3): qty 50

## 2017-06-06 MED ORDER — SODIUM CHLORIDE 0.9% FLUSH: 3.0000 mL | INTRAVENOUS | Status: DC | PRN

## 2017-06-06 MED ORDER — GENTAMICIN SULFATE 40 MG/ML IJ SOLN
INTRAMUSCULAR | Status: AC
  Filled 2017-06-06: qty 2

## 2017-06-06 MED ORDER — SODIUM CHLORIDE 0.9 % IV SOLN
INTRAVENOUS | Status: DC
  Administered 2017-06-06: 11:00:00 via INTRAVENOUS

## 2017-06-06 MED ORDER — CHLORHEXIDINE GLUCONATE 4 % EX LIQD: 60.0000 mL | Freq: Once | CUTANEOUS | Status: DC

## 2017-06-06 MED ORDER — ONDANSETRON HCL 4 MG/2ML IJ SOLN
INTRAMUSCULAR | Status: AC
  Filled 2017-06-06: qty 2

## 2017-06-06 MED ORDER — LIDOCAINE HCL (PF) 1 % IJ SOLN
INTRAMUSCULAR | Status: DC | PRN
  Administered 2017-06-06: 40 mL via INTRADERMAL

## 2017-06-06 MED ORDER — APIXABAN 2.5 MG PO TABS
2.5000 mg | ORAL_TABLET | Freq: Two times a day (BID) | ORAL | Status: DC
  Filled 2017-06-06: qty 1

## 2017-06-06 MED ORDER — SODIUM CHLORIDE 0.9% FLUSH: 3.0000 mL | Freq: Two times a day (BID) | INTRAVENOUS | Status: DC

## 2017-06-06 MED ORDER — HYDROCODONE-ACETAMINOPHEN 5-325 MG PO TABS: 1.0000 | ORAL_TABLET | ORAL | Status: DC | PRN

## 2017-06-06 MED ORDER — CEFAZOLIN SODIUM-DEXTROSE 2-4 GM/100ML-% IV SOLN
INTRAVENOUS | Status: AC
  Filled 2017-06-06: qty 100

## 2017-06-06 MED ORDER — APIXABAN 2.5 MG PO TABS
2.5000 mg | ORAL_TABLET | Freq: Two times a day (BID) | ORAL | Status: DC
  Administered 2017-06-06 – 2017-06-07 (×2): 2.5 mg via ORAL
  Filled 2017-06-06 (×3): qty 1

## 2017-06-06 MED ORDER — CEFAZOLIN SODIUM-DEXTROSE 2-4 GM/100ML-% IV SOLN
2.0000 g | INTRAVENOUS | Status: AC
  Administered 2017-06-06: 2 g via INTRAVENOUS
  Filled 2017-06-06: qty 100

## 2017-06-06 MED ORDER — MUPIROCIN 2 % EX OINT: 1.0000 "application " | TOPICAL_OINTMENT | Freq: Once | CUTANEOUS | Status: DC

## 2017-06-06 SURGICAL SUPPLY — 12 items
CABLE SURGICAL S-101-97-12 (CABLE) ×4 IMPLANT
CATH RIGHTSITE C315HIS02 (CATHETERS) ×2 IMPLANT
IPG PACE AZUR XT DR MRI W1DR01 (Pacemaker) IMPLANT
LEAD CAPSURE NOVUS 5076-52CM (Lead) ×2 IMPLANT
LEAD SELECT SECURE 3830 383069 (Lead) IMPLANT
PACE AZURE XT DR MRI W1DR01 (Pacemaker) ×3 IMPLANT
PAD DEFIB LIFELINK (PAD) ×2 IMPLANT
SELECT SECURE 3830 383069 (Lead) ×3 IMPLANT
SHEATH CLASSIC 7F (SHEATH) ×4 IMPLANT
SLITTER 6232ADJ (MISCELLANEOUS) ×2 IMPLANT
TRAY PACEMAKER INSERTION (PACKS) ×2 IMPLANT
WIRE HI TORQ VERSACORE-J 145CM (WIRE) ×2 IMPLANT

## 2017-06-06 NOTE — Discharge Instructions (Signed)
° ° °  Supplemental Discharge Instructions for  Pacemaker/Defibrillator Patients  Activity No heavy lifting or vigorous activity with your left/right arm for 6 to 8 weeks.  Do not raise your left/right arm above your head for one week.  Gradually raise your affected arm as drawn below.            __        06/10/17                   06/11/17                   06/12/17                    06/13/17   NO DRIVING for   1 week  ; you may begin driving on  8/87/57   .  WOUND CARE - Keep the wound area clean and dry.  Do not get this area wet for one week. No showers for one week; you may shower on  06/13/17   . - The tape/steri-strips on your wound will fall off; do not pull them off.  No bandage is needed on the site.  DO  NOT apply any creams, oils, or ointments to the wound area. - If you notice any drainage or discharge from the wound, any swelling or bruising at the site, or you develop a fever > 101? F after you are discharged home, call the office at once.  Special Instructions - You are still able to use cellular telephones; use the ear opposite the side where you have your pacemaker/defibrillator.  Avoid carrying your cellular phone near your device. - When traveling through airports, show security personnel your identification card to avoid being screened in the metal detectors.  Ask the security personnel to use the hand wand. - Avoid arc welding equipment, MRI testing (magnetic resonance imaging), TENS units (transcutaneous nerve stimulators).  Call the office for questions about other devices. - Avoid electrical appliances that are in poor condition or are not properly grounded. - Microwave ovens are safe to be near or to operate.

## 2017-06-06 NOTE — Care Management Note (Signed)
Case Management Note  Patient Details  Name: ANNALEESE GUIER MRN: [id number] Date of Birth: May 22, 1932  Subjective/Objective:     From home with spouse, s/p pacemaker implant. She has [employer]. She has already been taking eliquis.   PCP Ravisankar Avva                Action/Plan: NCM will follow for dc needs.   Expected Discharge Date:                  Expected Discharge Plan:     In-House Referral:     Discharge planning Services  CM Consult  Post Acute Care Choice:    Choice offered to:     DME Arranged:    DME Agency:     HH Arranged:    HH Agency:     Status of Service:  In process, will continue to follow  If discussed at Long Length of Stay Meetings, dates discussed:    Additional Comments:  Zenon Mayo, RN 06/06/2017, 5:46 PM

## 2017-06-06 NOTE — H&P (Signed)
ELECTROPHYSIOLOGY CONSULT (H&P) NOTE    Primary Care Physician: [name], MD Referring Physician:  Roderic Palau NP  Admit Date: 06/06/2017  Reason for consultation:  bradycardia  Brandi Chambers is a 83 y.o. female with a h/o SVT and atrial fibrillation.  She has had progressive symptomatic atrial fibrillation and bradycardia.  I have been consulted by Roderic Palau NP for further evaluation.  The patient has been documented to have atrial flutter, atrial fibrillation, and also previously SVT.  More recently, she has had difficulty with afib.  She has been treated with amiodarone and metoprolol.   She has been taken off of beta blockers due to symptomatic bradycardia.  She has subsequently had afib with RVR.  She reports symptoms of fatigue and decreased exercise tolerance with her bradycardia.  Today, she denies symptoms of palpitations, chest pain, shortness of breath, orthopnea, PND, dizziness, presyncope, syncope, or neurologic sequela. The patient is tolerating medications without difficulties and is otherwise without complaint today.   Past Medical History:  Diagnosis Date  . Chronic congestive heart failure with left ventricular diastolic dysfunction ([place])   . Hypertension   . Hypoglycemia   . Nephrolithiasis   . Osteoporosis   . Paroxysmal SVT (supraventricular tachycardia) ([place]) 2014  . Persistent atrial fibrillation ([place])   . Thyroid disease   . Typical atrial flutter [facility])    Past Surgical History:  Procedure Laterality Date  . CARDIOVERSION N/A 05/14/2017   Procedure: CARDIOVERSION;  Surgeon: [name], MD;  Location: [place];  Service: Cardiovascular;  Laterality: N/A;       No Known Allergies  Social History   Social History  . Marital status: Married    Spouse name: N/A  . Number of children: 1  . Years of education: N/A   Occupational History  . Not on file.   Social History Main Topics  . Smoking status: Former Smoker    Types:  Cigarettes    Quit date: 05/18/1969  . Smokeless tobacco: Never Used  . Alcohol use No  . Drug use: No  . Sexual activity: Not on file   Other Topics Concern  . Not on file   Social History Narrative   Lives in [place] with her spouse.  Retired    Family History  Problem Relation Age of Onset  . Breast cancer Mother   . Lung cancer Father     ROS- All systems are reviewed and negative except as per the HPI above  Physical Exam: Telemetry: Vitals:   06/06/17 0903  BP: (!) 152/78  Pulse: (!) 38    GEN- The patient is elderly but pleasant appearing, alert and oriented x 3 today.   Head- normocephalic, atraumatic Eyes-  Sclera clear, conjunctiva pink Ears- hearing intact Oropharynx- clear Neck- supple, no JVP Lymph- no cervical lymphadenopathy Lungs- Clear to ausculation bilaterally, normal work of breathing Heart- Regular rate and rhythm  GI- soft, NT, ND, + BS Extremities- no clubbing, cyanosis, + edema MS- no significant deformity or atrophy Skin- no rash or lesion Psych- euthymic mood, full affect Neuro- strength and sensation are intact  EKG tracing from today is personally reviewed and reveals junctional bradycardia with heart rate 30s  Labs:   Lab Results  Component Value Date   WBC 7.8 04/30/2017   HGB 13.9 05/14/2017   HCT 41.0 05/14/2017   MCV 87.4 04/30/2017   PLT 210 04/30/2017   No results for input(s): NA, K, CL, CO2, BUN, CREATININE, CALCIUM, PROT, BILITOT, ALKPHOS, ALT,  AST, GLUCOSE in the last 168 hours.  Invalid input(s): LABALBU No results found for: CKTOTAL, CKMB, CKMBINDEX, TROPONINI No results found for: CHOL No results found for: HDL No results found for: LDLCALC No results found for: TRIG No results found for: CHOLHDL No results found for: LDLDIRECT     Echo:  EF 45-50%, moderate TR, small pericardial effusion  ASSESSMENT AND PLAN:   1. Sinus bradycardia The patient has symptomatic bradycardia.  No reversible causes have been  found.  I would therefore recommend pacemaker implantation at this time.  Risks, benefits, alternatives to pacemaker implantation were discussed in detail with the patient today. The patient understands that the risks include but are not limited to bleeding, infection, pneumothorax, perforation, tamponade, vascular damage, renal failure, MI, stroke, death,  and lead dislodgement and wishes to proceed. We will therefore schedule the procedure at the next available time.  2. Persistent afib Has recently required cardioversion On eliquis Given recent cardioversion, I do not feel that we can hold her anticoagulation for pacemaker.  She understands increased bleeding risks. Will continue amiodarone and resume beta blockers after PPM is in place.  3. HTN Stable No change required today  Admit for pacemaker implantation later today.  [name], MD 06/06/2017  9:43 AM

## 2017-06-06 NOTE — Interval H&P Note (Signed)
History and Physical Interval Note:  06/06/2017 9:50 AM  Brandi Chambers  has presented today for surgery, with the diagnosis of bradicardia  The various methods of treatment have been discussed with the patient and family. After consideration of risks, benefits and other options for treatment, the patient has consented to  Procedure(s): Pacemaker Implant (N/A) as a surgical intervention .  The patient's history has been reviewed, patient examined, no change in status, stable for surgery.  I have reviewed the patient's chart and labs.  Questions were answered to the patient's satisfaction.     [name]

## 2017-06-06 NOTE — H&P (View-Only) (Signed)
ELECTROPHYSIOLOGY CONSULT (H&P) NOTE    Primary Care Physician: [name], MD Referring Physician:  Roderic Palau NP  Admit Date: 06/06/2017  Reason for consultation:  bradycardia  Brandi Chambers is a 83 y.o. female with a h/o SVT and atrial fibrillation.  She has had progressive symptomatic atrial fibrillation and bradycardia.  I have been consulted by Roderic Palau NP for further evaluation.  The patient has been documented to have atrial flutter, atrial fibrillation, and also previously SVT.  More recently, she has had difficulty with afib.  She has been treated with amiodarone and metoprolol.   She has been taken off of beta blockers due to symptomatic bradycardia.  She has subsequently had afib with RVR.  She reports symptoms of fatigue and decreased exercise tolerance with her bradycardia.  Today, she denies symptoms of palpitations, chest pain, shortness of breath, orthopnea, PND, dizziness, presyncope, syncope, or neurologic sequela. The patient is tolerating medications without difficulties and is otherwise without complaint today.   Past Medical History:  Diagnosis Date  . Chronic congestive heart failure with left ventricular diastolic dysfunction ([place])   . Hypertension   . Hypoglycemia   . Nephrolithiasis   . Osteoporosis   . Paroxysmal SVT (supraventricular tachycardia) ([place]) 2014  . Persistent atrial fibrillation ([place])   . Thyroid disease   . Typical atrial flutter [facility])    Past Surgical History:  Procedure Laterality Date  . CARDIOVERSION N/A 05/14/2017   Procedure: CARDIOVERSION;  Surgeon: [name], MD;  Location: [place];  Service: Cardiovascular;  Laterality: N/A;       No Known Allergies  Social History   Social History  . Marital status: Married    Spouse name: N/A  . Number of children: 1  . Years of education: N/A   Occupational History  . Not on file.   Social History Main Topics  . Smoking status: Former Smoker    Types:  Cigarettes    Quit date: 05/18/1969  . Smokeless tobacco: Never Used  . Alcohol use No  . Drug use: No  . Sexual activity: Not on file   Other Topics Concern  . Not on file   Social History Narrative   Lives in [place] with her spouse.  Retired    Family History  Problem Relation Age of Onset  . Breast cancer Mother   . Lung cancer Father     ROS- All systems are reviewed and negative except as per the HPI above  Physical Exam: Telemetry: Vitals:   06/06/17 0903  BP: (!) 152/78  Pulse: (!) 38    GEN- The patient is elderly but pleasant appearing, alert and oriented x 3 today.   Head- normocephalic, atraumatic Eyes-  Sclera clear, conjunctiva pink Ears- hearing intact Oropharynx- clear Neck- supple, no JVP Lymph- no cervical lymphadenopathy Lungs- Clear to ausculation bilaterally, normal work of breathing Heart- Regular rate and rhythm  GI- soft, NT, ND, + BS Extremities- no clubbing, cyanosis, + edema MS- no significant deformity or atrophy Skin- no rash or lesion Psych- euthymic mood, full affect Neuro- strength and sensation are intact  EKG tracing from today is personally reviewed and reveals junctional bradycardia with heart rate 30s  Labs:   Lab Results  Component Value Date   WBC 7.8 04/30/2017   HGB 13.9 05/14/2017   HCT 41.0 05/14/2017   MCV 87.4 04/30/2017   PLT 210 04/30/2017   No results for input(s): NA, K, CL, CO2, BUN, CREATININE, CALCIUM, PROT, BILITOT, ALKPHOS, ALT,  AST, GLUCOSE in the last 168 hours.  Invalid input(s): LABALBU No results found for: CKTOTAL, CKMB, CKMBINDEX, TROPONINI No results found for: CHOL No results found for: HDL No results found for: LDLCALC No results found for: TRIG No results found for: CHOLHDL No results found for: LDLDIRECT     Echo:  EF 45-50%, moderate TR, small pericardial effusion  ASSESSMENT AND PLAN:   1. Sinus bradycardia The patient has symptomatic bradycardia.  No reversible causes have been  found.  I would therefore recommend pacemaker implantation at this time.  Risks, benefits, alternatives to pacemaker implantation were discussed in detail with the patient today. The patient understands that the risks include but are not limited to bleeding, infection, pneumothorax, perforation, tamponade, vascular damage, renal failure, MI, stroke, death,  and lead dislodgement and wishes to proceed. We will therefore schedule the procedure at the next available time.  2. Persistent afib Has recently required cardioversion On eliquis Given recent cardioversion, I do not feel that we can hold her anticoagulation for pacemaker.  She understands increased bleeding risks. Will continue amiodarone and resume beta blockers after PPM is in place.  3. HTN Stable No change required today  Admit for pacemaker implantation later today.  [name], MD 06/06/2017  9:43 AM

## 2017-06-06 NOTE — Discharge Summary (Signed)
ELECTROPHYSIOLOGY PROCEDURE DISCHARGE SUMMARY    Patient ID: Brandi Chambers,  MRN: [id number], DOB/AGE: 1932/07/01 83 y.o.  Admit date: 06/06/2017 Discharge date: 06/07/2017  Primary Care Physician: [name], MD Electrophysiologist: Tobie Hellen  Primary Discharge Diagnosis:  Symptomatic bradycardia status post pacemaker implantation this admission  Secondary Discharge Diagnosis:  1.  Persistent atrial fibrillation 2.  SVT 3.  HTN 4.  Hypothryoidism  No Known Allergies   Procedures This Admission:  1.  Implantation of a MDT dual chamber PPM on 06/06/17 by Dr [name].  The patient received a MDT model number Azure PPM with model number 5076 right atrial lead and 3830 right ventricular lead. There were no immediate post procedure complications. 2.  CXR on 06/07/17 demonstrated no pneumothorax status post device implantation.   Brief HPI: Brandi Chambers is a 83 y.o. female was referred to electrophysiology in the outpatient setting for consideration of PPM implantation.  Past medical history includes persistent atrial fibrillation and symptomatic bradycardia.  The patient has had symptomatic bradycardia without reversible causes identified.  Risks, benefits, and alternatives to PPM implantation were reviewed with the patient who wished to proceed.   Hospital Course:  The patient was admitted and underwent implantation of a MDT dual chamber PPM with details as outlined above.  She  was monitored on telemetry overnight which demonstrated atrial fibrillation with V pacing.  Left chest was without hematoma or ecchymosis.  The device was interrogated and found to be functioning normally.  CXR was obtained and demonstrated no pneumothorax status post device implantation.  Wound care, arm mobility, and restrictions were reviewed with the patient.  The patient was examined and considered stable for discharge to home.   Will stop Amiodarone at this point and monitor for recurrent RVR through  device.     Physical Exam: Vitals:   06/06/17 1900 06/06/17 1921 06/06/17 2000 06/07/17 0121  BP: (!) 160/84 (!) 165/91 (!) 144/91 121/68  Pulse: (!) 59 60 (!) 59 60  Resp: 16 12 17 16   Temp:  98.1 F (36.7 C)  98.1 F (36.7 C)  TempSrc:  Oral  Oral  SpO2: 98% 97% 96% 96%  Weight:    123 lb 0.3 oz (55.8 kg)  Height:        GEN- The patient is elderly appearing, alert and oriented x 3 today.   HEENT: normocephalic, atraumatic; sclera clear, conjunctiva pink; hearing intact; oropharynx clear; neck supple Lungs- Clear to ausculation bilaterally, normal work of breathing.  No wheezes, rales, rhonchi Heart- Regular rate and rhythm (paced) GI- soft, non-tender, non-distended, bowel sounds present Extremities- no clubbing, cyanosis, or edema  MS- no significant deformity or atrophy Skin- warm and dry, no rash or lesion, left chest without hematoma/ecchymosis Psych- euthymic mood, full affect Neuro- strength and sensation are intact   Labs:   Lab Results  Component Value Date   WBC 8.4 06/06/2017   HGB 13.0 06/06/2017   HCT 39.8 06/06/2017   MCV 85.8 06/06/2017   PLT 241 06/06/2017     Recent Labs Lab 06/07/17 0355  NA 138  K 4.7  CL 108  CO2 23  BUN 20  CREATININE 1.36*  CALCIUM 8.6*  GLUCOSE 96    Discharge Medications:  Allergies as of 06/07/2017   No Known Allergies     Medication List    STOP taking these medications   amiodarone 200 MG tablet Commonly known as:  PACERONE     TAKE these medications   apixaban  2.5 MG Tabs tablet Commonly known as:  ELIQUIS Take 1 tablet (2.5 mg total) by mouth 2 (two) times daily.   diazepam 5 MG tablet Commonly known as:  VALIUM Take 2.5 mg by mouth daily as needed for anxiety.   levothyroxine 75 MCG tablet Commonly known as:  SYNTHROID, LEVOTHROID Take 75 mcg by mouth daily before breakfast.   lisinopril 5 MG tablet Commonly known as:  PRINIVIL,ZESTRIL Take 1 tablet (5 mg total) by mouth daily.     nitroGLYCERIN 0.4 MG SL tablet Commonly known as:  NITROSTAT Place 1 tablet (0.4 mg total) under the tongue every 5 (five) minutes as needed for chest pain.       Disposition:  Discharge Instructions    Diet - low sodium heart healthy    Complete by:  As directed    Increase activity slowly    Complete by:  As directed      Follow-up Information    [facility] Follow up on 06/24/2017.   Specialty:  Cardiology Why:  at [facility] information: [address] [place] [phone]       [name], MD Follow up on 09/06/2017.   Specialty:  Cardiology Why:  at 11:45AM Contact information: [place] [address] [phone]        [facility] Follow up on 07/04/2017.   Specialty:  Cardiology Why:  at [facility] information: [address] [id number] [place] [phone]          Duration of Discharge Encounter: Greater than 30 minutes including physician time.  Signed, [name], NP 06/07/2017 7:57 AM  I have seen, examined the patient, and reviewed the above assessment and plan.  On exam, RRR (paced).  No hematoma.  CXR reviewed and reveals stable leads.  Device interrogation is reviewed and normal.  Changes to above are made where necessary.    Co Sign: [name], MD 06/07/2017 1:36 PM

## 2017-06-06 NOTE — Progress Notes (Signed)
Notified Rennis Harding, RN of patient's abnormal lab values.

## 2017-06-06 NOTE — Progress Notes (Addendum)
[name] for repeat EKG.  EKG to be reviewed by Ceasar Lund   Pt shows junctional brady at 38 bpm, symptomatic with significant shortness of breath, worse lately in SR with brady, occasional nausea. Has h/o brady in the past with Dr. [name] discussing PPM in the past. She is now off BB's and amio at 100 mg a day. Discussed with Dr. [name] and she will be admitted for PPM. She ate at 6:30 am and took eliquis this am. Will probably go for PPM this afternoon around 2-3 pm.

## 2017-06-07 ENCOUNTER — Ambulatory Visit (HOSPITAL_COMMUNITY): Payer: PPO

## 2017-06-07 ENCOUNTER — Encounter (HOSPITAL_COMMUNITY): Payer: Self-pay | Admitting: Internal Medicine

## 2017-06-07 DIAGNOSIS — I5032 Chronic diastolic (congestive) heart failure: Secondary | ICD-10-CM | POA: Diagnosis not present

## 2017-06-07 DIAGNOSIS — R40241 Glasgow coma scale score 13-15, unspecified time: Secondary | ICD-10-CM | POA: Diagnosis not present

## 2017-06-07 DIAGNOSIS — M810 Age-related osteoporosis without current pathological fracture: Secondary | ICD-10-CM | POA: Diagnosis not present

## 2017-06-07 DIAGNOSIS — E079 Disorder of thyroid, unspecified: Secondary | ICD-10-CM | POA: Diagnosis not present

## 2017-06-07 DIAGNOSIS — I110 Hypertensive heart disease with heart failure: Secondary | ICD-10-CM | POA: Diagnosis not present

## 2017-06-07 DIAGNOSIS — Z950 Presence of cardiac pacemaker: Secondary | ICD-10-CM | POA: Diagnosis not present

## 2017-06-07 DIAGNOSIS — I481 Persistent atrial fibrillation: Secondary | ICD-10-CM | POA: Diagnosis not present

## 2017-06-07 DIAGNOSIS — I441 Atrioventricular block, second degree: Secondary | ICD-10-CM

## 2017-06-07 DIAGNOSIS — J90 Pleural effusion, not elsewhere classified: Secondary | ICD-10-CM | POA: Diagnosis not present

## 2017-06-07 DIAGNOSIS — I483 Typical atrial flutter: Secondary | ICD-10-CM | POA: Diagnosis not present

## 2017-06-07 DIAGNOSIS — Z87891 Personal history of nicotine dependence: Secondary | ICD-10-CM | POA: Diagnosis not present

## 2017-06-07 LAB — BASIC METABOLIC PANEL
Anion gap: 7 (ref 5–15)
BUN: 20 mg/dL (ref 6–20)
CO2: 23 mmol/L (ref 22–32)
Calcium: 8.6 mg/dL — ABNORMAL LOW (ref 8.9–10.3)
Chloride: 108 mmol/L (ref 101–111)
Creatinine, Ser: 1.36 mg/dL — ABNORMAL HIGH (ref 0.44–1.00)
GFR calc Af Amer: 41 mL/min — ABNORMAL LOW (ref >60)
GFR calc non Af Amer: 35 mL/min — ABNORMAL LOW (ref >60)
Glucose, Bld: 96 mg/dL (ref 65–99)
Potassium: 4.7 mmol/L (ref 3.5–5.1)
Sodium: 138 mmol/L (ref 135–145)

## 2017-06-07 MED FILL — Heparin Sodium (Porcine) 2 Unit/ML in Sodium Chloride 0.9%: INTRAMUSCULAR | Qty: 500 | Status: AC

## 2017-06-18 ENCOUNTER — Telehealth: Payer: Self-pay | Admitting: Physician Assistant

## 2017-06-18 MED ORDER — LISINOPRIL 5 MG PO TABS: 5.0000 mg | ORAL_TABLET | Freq: Every day | ORAL | 5 refills | Status: DC

## 2017-06-18 NOTE — Telephone Encounter (Signed)
°*  STAT* If patient is at the pharmacy, call can be transferred to refill team.   1. Which medications need to be refilled? (please list name of each medication and dose if known) lisinopril 5mg  1x day  2. Which pharmacy/location (including street and city if local pharmacy) is medication to be sent to? [facility] 3. Do they need a 30 day or 90 day supply? 30   Pharmacy stated that they have sent a electronic request over twice pt is out of medication

## 2017-06-18 NOTE — Telephone Encounter (Signed)
Medication refill of lisinopril sent to pharmacy for patient.

## 2017-06-24 ENCOUNTER — Ambulatory Visit (INDEPENDENT_AMBULATORY_CARE_PROVIDER_SITE_OTHER): Payer: PPO | Admitting: *Deleted

## 2017-06-24 ENCOUNTER — Encounter: Payer: Self-pay | Admitting: Internal Medicine

## 2017-06-24 DIAGNOSIS — I4819 Other persistent atrial fibrillation: Secondary | ICD-10-CM

## 2017-06-24 DIAGNOSIS — I481 Persistent atrial fibrillation: Secondary | ICD-10-CM | POA: Diagnosis not present

## 2017-06-24 DIAGNOSIS — I495 Sick sinus syndrome: Secondary | ICD-10-CM

## 2017-06-25 LAB — CUP PACEART INCLINIC DEVICE CHECK
Battery Remaining Longevity: 65 mo
Battery Voltage: 3.17 V
Brady Statistic AP VP Percent: 0 %
Brady Statistic AP VS Percent: 0 %
Brady Statistic AS VP Percent: 0 %
Brady Statistic AS VS Percent: 0 %
Brady Statistic RA Percent Paced: 0.59 %
Brady Statistic RV Percent Paced: 65.22 %
Date Time Interrogation Session: 20180709191802
Implantable Lead Implant Date: 20180621
Implantable Lead Implant Date: 20180621
Implantable Lead Location: 753859
Implantable Lead Location: 753860
Implantable Lead Model: 3830
Implantable Lead Model: 5076
Implantable Pulse Generator Implant Date: 20180621
Lead Channel Impedance Value: 285 Ohm
Lead Channel Impedance Value: 304 Ohm
Lead Channel Impedance Value: 437 Ohm
Lead Channel Impedance Value: 532 Ohm
Lead Channel Pacing Threshold Amplitude: 2.75 V
Lead Channel Pacing Threshold Amplitude: 4.5 V
Lead Channel Pacing Threshold Pulse Width: 1 ms
Lead Channel Pacing Threshold Pulse Width: 1 ms
Lead Channel Sensing Intrinsic Amplitude: 1.375 mV
Lead Channel Sensing Intrinsic Amplitude: 1.625 mV
Lead Channel Sensing Intrinsic Amplitude: 1.625 mV
Lead Channel Sensing Intrinsic Amplitude: 1.75 mV
Lead Channel Setting Pacing Amplitude: 3.5 V
Lead Channel Setting Pacing Amplitude: 5 V
Lead Channel Setting Pacing Pulse Width: 1 ms
Lead Channel Setting Sensing Sensitivity: 1.2 mV

## 2017-06-25 NOTE — Progress Notes (Signed)
Wound check appointment. Steri-strips removed. Wound without redness or edema. Incision edges approximated, wound well healed. Normal device function. Sensing and impedances consistent with implant measurements. Increase in RV threshold noted, now 2.75V@ 1.29ms (uni), 4.5V@1 .70ms (bi)--JA aware. His bundle testing performed with use of rhythm strip--nonselective capture noted from 6V until LOC, no changes per JA. Device programmed at 3.5V(RA) and 5V (RV) for extra safety margin until 3 month visit. Histogram distribution appropriate for patient and level of activity. Rate response turned on this ov per JA. Persistent AF + Eliquis. No high ventricular rates noted. Patient educated about wound care, arm mobility, lifting restrictions. ROV w/ AS on 8/3 @ 0820 and JA on 9/21 @ 1145.

## 2017-07-04 ENCOUNTER — Ambulatory Visit (HOSPITAL_COMMUNITY)
Admission: RE | Admit: 2017-07-04 | Discharge: 2017-07-04 | Disposition: A | Discharge status: Home / Self Care | Payer: PPO | Source: Ambulatory Visit | Attending: Nurse Practitioner | Admitting: Nurse Practitioner

## 2017-07-04 ENCOUNTER — Encounter (HOSPITAL_COMMUNITY): Payer: Self-pay | Admitting: Nurse Practitioner

## 2017-07-04 VITALS — BP 144/90 | HR 111 | Ht 62.5 in | Wt 121.4 lb

## 2017-07-04 DIAGNOSIS — I110 Hypertensive heart disease with heart failure: Secondary | ICD-10-CM | POA: Insufficient documentation

## 2017-07-04 DIAGNOSIS — Z87442 Personal history of urinary calculi: Secondary | ICD-10-CM | POA: Insufficient documentation

## 2017-07-04 DIAGNOSIS — Z9889 Other specified postprocedural states: Secondary | ICD-10-CM | POA: Diagnosis not present

## 2017-07-04 DIAGNOSIS — I471 Supraventricular tachycardia: Secondary | ICD-10-CM | POA: Insufficient documentation

## 2017-07-04 DIAGNOSIS — Z801 Family history of malignant neoplasm of trachea, bronchus and lung: Secondary | ICD-10-CM | POA: Diagnosis not present

## 2017-07-04 DIAGNOSIS — Z87891 Personal history of nicotine dependence: Secondary | ICD-10-CM | POA: Insufficient documentation

## 2017-07-04 DIAGNOSIS — I4819 Other persistent atrial fibrillation: Secondary | ICD-10-CM

## 2017-07-04 DIAGNOSIS — I5032 Chronic diastolic (congestive) heart failure: Secondary | ICD-10-CM | POA: Diagnosis not present

## 2017-07-04 DIAGNOSIS — M06841 Other specified rheumatoid arthritis, right hand: Secondary | ICD-10-CM | POA: Insufficient documentation

## 2017-07-04 DIAGNOSIS — Z7902 Long term (current) use of antithrombotics/antiplatelets: Secondary | ICD-10-CM | POA: Diagnosis not present

## 2017-07-04 DIAGNOSIS — E079 Disorder of thyroid, unspecified: Secondary | ICD-10-CM | POA: Diagnosis not present

## 2017-07-04 DIAGNOSIS — E162 Hypoglycemia, unspecified: Secondary | ICD-10-CM | POA: Diagnosis not present

## 2017-07-04 DIAGNOSIS — Z950 Presence of cardiac pacemaker: Secondary | ICD-10-CM | POA: Insufficient documentation

## 2017-07-04 DIAGNOSIS — I481 Persistent atrial fibrillation: Secondary | ICD-10-CM | POA: Insufficient documentation

## 2017-07-04 DIAGNOSIS — M06842 Other specified rheumatoid arthritis, left hand: Secondary | ICD-10-CM | POA: Insufficient documentation

## 2017-07-04 DIAGNOSIS — Z803 Family history of malignant neoplasm of breast: Secondary | ICD-10-CM | POA: Diagnosis not present

## 2017-07-04 DIAGNOSIS — I483 Typical atrial flutter: Secondary | ICD-10-CM | POA: Insufficient documentation

## 2017-07-04 MED ORDER — FUROSEMIDE 20 MG PO TABS: 10.0000 mg | ORAL_TABLET | Freq: Every day | ORAL | 2 refills | Status: DC

## 2017-07-04 MED ORDER — METOPROLOL SUCCINATE ER 25 MG PO TB24: 12.5000 mg | ORAL_TABLET | Freq: Every day | ORAL | 3 refills | Status: DC

## 2017-07-04 NOTE — Progress Notes (Signed)
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 female with a h/o SVT  treated by Dr. [name] in August 2016 that is in the afib clinic today at the referral of [name], NP, for afib with rvr on 4/27. She was asked to start eliquis 5 mg bid but she has not started "scared of those drugs." BB was increased to 12.5 mg bid.HR today is 147 bpm, and she appears to be tolerating well. She also has h/o of tachy/brady syndrome, slow when she is in SR. Chadsvasc score is at least 4.  Returns 5/3, afib now at 124 bpm. She is tolerating increase in BB. States her readings at home are lower, if accurate. Will have to be cautious of brady when she returns to [place]. Other than feeling tired, she is tolearating ok. Weight is stable. She has started eliquis. Weight is stable. Pending echo update 5/4.  Returns 5/15 and continues in afib with at rest HR controlled around 100 bpm. She is not symptomatic at rest, notices increase in dyspnea with exertion. She has been taking eliquis consistently since 5/2 without missed doses at 2.5 mg bid. Pt was cardioverted 5/29 and did enjoy SR for 2-3 days before she noted her heart rate increase. She felt much better in SR and did notice less swelling of her lower extremities.Options to restore SR with AAD's discussed today. HR initially in the 140's but on resting around 100 bpm, which is what pt sees a home.  F/u in afib clinic. Pt chose to try amiodarone for restoring SR. She  converted after a few days on amiodarone but had SVR. I decreased her BB and she was to come back today for repeat EKG.She has noted increase of shortness of breath since going into afib early MAy but she feels that the shortness of breath has been worse over the last few weeks. She feels fine at rest. EKG  shows now afib with SVR at 47 bpm. She was noted to have exertional dyspnea with walking back to exam room,but PO at  100%.   F/u in afib clinic for f/u PPM and afib. She feels improved. EKG and interrogation show return of afib with   RVR. Site has healed.Has noted pedal edema  Today, she denies symptoms of palpitations, chest pain, shortness of breath, orthopnea, PND, lower extremity edema, dizziness, presyncope, syncope, or neurologic sequela. Positive for some mild fatigue/exertional dyspnea.The patient is tolerating medications without difficulties and is otherwise without complaint today.   Past Medical History:  Diagnosis Date  . Arthritis    RA IN HANDS  . Chronic congestive heart failure with left ventricular diastolic dysfunction ([place])   . Dyspnea   . Dysrhythmia   . Hypertension   . Hypoglycemia   . Nephrolithiasis   . Osteoporosis   . Paroxysmal SVT (supraventricular tachycardia) ([place]) 2014  . Persistent atrial fibrillation ([place])   . Thyroid disease   . Typical atrial flutter [facility])    Past Surgical History:  Procedure Laterality Date  . APPENDECTOMY    . CARDIOVERSION N/A 05/14/2017   Procedure: CARDIOVERSION;  Surgeon: [name], MD;  Location: [place];  Service: Cardiovascular;  Laterality: N/A;  . PACEMAKER IMPLANT N/A 06/06/2017   Procedure: Pacemaker Implant;  Surgeon: [name], MD;  Location: [place] [facility];  Service: Cardiovascular;  Laterality: N/A;    Current Outpatient Prescriptions  Medication Sig Dispense Refill  .  apixaban (ELIQUIS) 2.5 MG TABS tablet Take 1 tablet (2.5 mg total) by mouth 2 (two) times daily. 60 tablet 6  . diazepam (VALIUM) 5 MG tablet Take 2.5 mg by mouth daily as needed for anxiety.     Marland Kitchen levothyroxine (SYNTHROID, LEVOTHROID) 75 MCG tablet Take 75 mcg by mouth daily before breakfast.    . lisinopril (PRINIVIL,ZESTRIL) 5 MG tablet Take 1 tablet (5 mg total) by mouth daily. 30 tablet 5  . nitroGLYCERIN (NITROSTAT) 0.4 MG SL tablet Place 1 tablet (0.4 mg total) under the tongue every 5 (five) minutes as needed for chest pain. 90  tablet 3  . furosemide (LASIX) 20 MG tablet Take 0.5 tablets (10 mg total) by mouth daily. 30 tablet 2  . metoprolol succinate (TOPROL XL) 25 MG 24 hr tablet Take 0.5 tablets (12.5 mg total) by mouth at bedtime. 30 tablet 3   No current facility-administered medications for this encounter.     No Known Allergies  Social History   Social History  . Marital status: Married    Spouse name: N/A  . Number of children: 1  . Years of education: N/A   Occupational History  . Not on file.   Social History Main Topics  . Smoking status: Former Smoker    Types: Cigarettes    Quit date: 05/18/1969  . Smokeless tobacco: Never Used  . Alcohol use No  . Drug use: No  . Sexual activity: Not on file   Other Topics Concern  . Not on file   Social History Narrative   Lives in [place] with her spouse.  Retired    Family History  Problem Relation Age of Onset  . Breast cancer Mother   . Lung cancer Father     ROS- All systems are reviewed and negative except as per the HPI above  Physical Exam: Vitals:   07/04/17 0943  BP: (!) 144/90  Pulse: (!) 111  Weight: 121 lb 6.4 oz (55.1 kg)  Height: 5' 2.5" (1.588 m)   Wt Readings from Last 3 Encounters:  07/04/17 121 lb 6.4 oz (55.1 kg)  06/07/17 123 lb 0.3 oz (55.8 kg)  05/21/17 121 lb 3.2 oz (55 kg)    Labs: Lab Results  Component Value Date   NA 138 06/07/2017   K 4.7 06/07/2017   CL 108 06/07/2017   CO2 23 06/07/2017   GLUCOSE 96 06/07/2017   BUN 20 06/07/2017   CREATININE 1.36 (H) 06/07/2017   CALCIUM 8.6 (L) 06/07/2017   No results found for: INR No results found for: CHOL, HDL, LDLCALC, TRIG   GEN- The patient is well appearing, alert and oriented x 3 today.   Head- normocephalic, atraumatic Eyes-  Sclera clear, conjunctiva pink Ears- hearing intact Oropharynx- clear Neck- supple, no JVP Lymph- no cervical lymphadenopathy Lungs- Clear to ausculation bilaterally, normal work of breathing Heart-  Fast   irregular rate and rhythm, no murmurs, rubs or gallops, PMI not laterally displaced GI- soft, NT, ND, + BS Extremities- no clubbing, cyanosis, or  1+ edema bilaterally MS- no significant deformity or atrophy Skin- no rash or lesion Psych- euthymic mood, full affect Neuro- strength and sensation are intact  EKG- Afib at 111 bpm Interrogation of devise by Erlene Quan, [employer] rep revealed afib with v rates of 112 to 160 bpm, sensitivity changed from 1.2 to 0.9 Epic records reviewed   Assessment and Plan: 1. Tachy/brady with PPM 6/21 Now back with afib with rvr Add Toprol Er 25 mg  1/2 tab at hs for rate control Start lasix 20 mg 1/2 tab a day for pedal edema Pt will watch HR and BP at home and report untoward readings  Has f/u with [facility] 8/3 and toprol/ lasix may need adjustment, she will also need bmet.   Geroge Baseman Emalie Mcwethy, [facility] [address] [place], [place] [address] [phone]

## 2017-07-04 NOTE — Patient Instructions (Addendum)
Start Furosemide 10 mg every day.  This will be a half a tablet of the 20 mg.    Restart Metoprolol 25 mg 1/2 tablet every evening   Keep follow up with [name], NP

## 2017-07-11 NOTE — Addendum Note (Signed)
Encounter addended by: Sherran Needs, NP on: 07/11/2017  8:37 AM<BR>    Actions taken: LOS modified

## 2017-07-18 NOTE — Progress Notes (Signed)
Electrophysiology Office Note Date: 07/19/2017  ID:  Brandi Chambers, DOB [birth year], MRN [id number]  PCP: [name], MD Electrophysiologist: Allred  CC: Pacemaker follow-up (6 week HBP)  Brandi Chambers is a 83 y.o. female seen today for Dr [name].  She presents today for routine electrophysiology followup.  Since last being seen in our clinic, the patient reports doing very well. Her energy level and shortness of breath are much improved post pacemaker implant. She does still feel her heart "racing away" at times. She has not had to take Lasix. She was bitten by a tick early July and still has some irritation at the site.  She denies chest pain, dyspnea, PND, orthopnea, nausea, vomiting, dizziness, syncope, edema, weight gain, or early satiety.  Device History: MDT dual chamber PPM (His Bundle) implanted 2018 for tachy/brady   Past Medical History:  Diagnosis Date  . Arthritis    RA IN HANDS  . Chronic congestive heart failure with left ventricular diastolic dysfunction ([place])   . Dyspnea   . Dysrhythmia   . Hypertension   . Hypoglycemia   . Nephrolithiasis   . Osteoporosis   . Paroxysmal SVT (supraventricular tachycardia) ([place]) 2014  . Persistent atrial fibrillation ([place])   . Thyroid disease   . Typical atrial flutter [facility])    Past Surgical History:  Procedure Laterality Date  . APPENDECTOMY    . CARDIOVERSION N/A 05/14/2017   Procedure: CARDIOVERSION;  Surgeon: [name], MD;  Location: [place];  Service: Cardiovascular;  Laterality: N/A;  . PACEMAKER IMPLANT N/A 06/06/2017   Procedure: Pacemaker Implant;  Surgeon: [name], MD;  Location: [place] [facility];  Service: Cardiovascular;  Laterality: N/A;    Current Outpatient Prescriptions  Medication Sig Dispense Refill  . apixaban (ELIQUIS) 2.5 MG TABS tablet Take 1 tablet (2.5 mg total) by mouth 2 (two) times daily. 60 tablet 6  . levothyroxine (SYNTHROID, LEVOTHROID) 75 MCG tablet Take 75 mcg by  mouth daily before breakfast.    . lisinopril (PRINIVIL,ZESTRIL) 5 MG tablet Take 1 tablet (5 mg total) by mouth daily. 30 tablet 5  . metoprolol succinate (TOPROL XL) 25 MG 24 hr tablet Take 0.5 tablets (12.5 mg total) by mouth at bedtime. 30 tablet 3  . nitroGLYCERIN (NITROSTAT) 0.4 MG SL tablet Place 1 tablet (0.4 mg total) under the tongue every 5 (five) minutes as needed for chest pain. 90 tablet 3   No current facility-administered medications for this visit.     Allergies:   Patient has no known allergies.   Social History: Social History   Social History  . Marital status: Married    Spouse name: N/A  . Number of children: 1  . Years of education: N/A   Occupational History  . Not on file.   Social History Main Topics  . Smoking status: Former Smoker    Types: Cigarettes    Quit date: 05/18/1969  . Smokeless tobacco: Never Used  . Alcohol use No  . Drug use: No  . Sexual activity: Not on file   Other Topics Concern  . Not on file   Social History Narrative   Lives in [place] with her spouse.  Retired    Family History: Family History  Problem Relation Age of Onset  . Breast cancer Mother   . Lung cancer Father      Review of Systems: All other systems reviewed and are otherwise negative except as noted above.   Physical Exam: VS:  BP 126/76   Pulse 83   Ht 5' 2.5" (1.588 m)   Wt 120 lb 4 oz (54.5 kg)   SpO2 92%   BMI 21.64 kg/m  , BMI Body mass index is 21.64 kg/m.  GEN- The patient is elderly appearing, alert and oriented x 3 today.   HEENT: normocephalic, atraumatic; sclera clear, conjunctiva pink; hearing intact; oropharynx clear; neck supple Lungs- Clear to ausculation bilaterally, normal work of breathing.  No wheezes, rales, rhonchi Heart- Tachycardic irregular rate and rhythm  GI- soft, non-tender, non-distended, bowel sounds present Extremities- no clubbing, cyanosis, or edema MS- no significant deformity or atrophy Skin- warm and dry,  no rash or lesion; PPM pocket well healed Psych- euthymic mood, full affect Neuro- strength and sensation are intact  PPM Interrogation- reviewed in detail today,  See PACEART report  EKG:  EKG is ordered today. The ekg ordered today shows atrial fibrillation with V pacing, rate 83  Recent Labs: 04/16/2017: ALT 25; TSH 0.353 06/06/2017: Hemoglobin 13.0; Platelets 241 06/07/2017: BUN 20; Creatinine, Ser 1.36; Potassium 4.7; Sodium 138   Wt Readings from Last 3 Encounters:  07/19/17 120 lb 4 oz (54.5 kg)  07/04/17 121 lb 6.4 oz (55.1 kg)  06/07/17 123 lb 0.3 oz (55.8 kg)     Other studies Reviewed: Additional studies/ records that were reviewed today include: hospital records, AF clinic notes  Assessment and Plan:  1.  Symptomatic bradycardia Normal PPM function See Pace Art report No changes today His Bundle pacing capture unable to be assess today with heart rates in the 110-120's during threshold testing  2.  Persistent atrial fibrillation/tachy/brady Burden by device interrogation 100% V rates elevated Increase Toprol to 25mg  daily. I have asked her to call the office with persistent tachycardia  Continue Eliquis for CHADS2VASC of 3  3.  HTN Stable No change required today  4.  Chronic diastolic heart failure Euvolemic on exam She is not taking Lasix  5.  Tick bite Site red and slightly swollen I don't see any retained tick at site No rash, fevers, muscle aches I have advised follow up with PCP   Current medicines are reviewed at length with the patient today.   The patient does not have concerns regarding her medicines.  The following changes were made today:  none  Labs/ tests ordered today include: none Orders Placed This Encounter  Procedures  . CUP PACEART INCLINIC DEVICE CHECK     Disposition:   Follow up with Dr [name] as scheduled     Signed, [name], NP 07/19/2017 8:35 AM  [facility] [address] [place]  [place] [address] [phone] (office) [phone] (fax)

## 2017-07-19 ENCOUNTER — Encounter: Payer: Self-pay | Admitting: Nurse Practitioner

## 2017-07-19 ENCOUNTER — Ambulatory Visit (INDEPENDENT_AMBULATORY_CARE_PROVIDER_SITE_OTHER): Payer: PPO | Admitting: Nurse Practitioner

## 2017-07-19 VITALS — BP 126/76 | HR 83 | Ht 62.5 in | Wt 120.2 lb

## 2017-07-19 DIAGNOSIS — I10 Essential (primary) hypertension: Secondary | ICD-10-CM | POA: Diagnosis not present

## 2017-07-19 DIAGNOSIS — I481 Persistent atrial fibrillation: Secondary | ICD-10-CM | POA: Diagnosis not present

## 2017-07-19 DIAGNOSIS — I5032 Chronic diastolic (congestive) heart failure: Secondary | ICD-10-CM

## 2017-07-19 DIAGNOSIS — I4819 Other persistent atrial fibrillation: Secondary | ICD-10-CM

## 2017-07-19 DIAGNOSIS — R001 Bradycardia, unspecified: Secondary | ICD-10-CM | POA: Diagnosis not present

## 2017-07-19 LAB — CUP PACEART INCLINIC DEVICE CHECK
Date Time Interrogation Session: 20180803081711
Implantable Lead Implant Date: 20180621
Implantable Lead Implant Date: 20180621
Implantable Lead Location: 753859
Implantable Lead Location: 753860
Implantable Lead Model: 3830
Implantable Lead Model: 5076
Implantable Pulse Generator Implant Date: 20180621

## 2017-07-19 NOTE — Patient Instructions (Signed)
Medication Instructions:  Your physician has recommended you make the following change in your medication: 1.) we are increasing your Metoprolol to one whole tablet daily.    Labwork: None Ordered   Testing/Procedures: None Ordered  Follow-Up: Your physician recommends that you schedule a follow-up appointment in: with Dr. [name] as scheduled.   Any Other Special Instructions Will Be Listed Below (If Applicable).  Keep track of your heart rates at home. If they stay at a fast pace please give [place] a call.    If you need a refill on your cardiac medications before your next appointment, please call your pharmacy.

## 2017-08-03 ENCOUNTER — Other Ambulatory Visit (HOSPITAL_COMMUNITY): Payer: Self-pay | Admitting: Nurse Practitioner

## 2017-08-13 DIAGNOSIS — L570 Actinic keratosis: Secondary | ICD-10-CM | POA: Diagnosis not present

## 2017-08-13 DIAGNOSIS — L821 Other seborrheic keratosis: Secondary | ICD-10-CM | POA: Diagnosis not present

## 2017-08-13 DIAGNOSIS — C44722 Squamous cell carcinoma of skin of right lower limb, including hip: Secondary | ICD-10-CM | POA: Diagnosis not present

## 2017-08-13 DIAGNOSIS — D229 Melanocytic nevi, unspecified: Secondary | ICD-10-CM | POA: Diagnosis not present

## 2017-08-13 DIAGNOSIS — D1801 Hemangioma of skin and subcutaneous tissue: Secondary | ICD-10-CM | POA: Diagnosis not present

## 2017-08-28 ENCOUNTER — Ambulatory Visit: Payer: PPO | Admitting: Nurse Practitioner

## 2017-09-06 ENCOUNTER — Ambulatory Visit (INDEPENDENT_AMBULATORY_CARE_PROVIDER_SITE_OTHER): Payer: PPO | Admitting: Internal Medicine

## 2017-09-06 VITALS — BP 118/76 | HR 78 | Ht 62.0 in | Wt 119.6 lb

## 2017-09-06 DIAGNOSIS — I441 Atrioventricular block, second degree: Secondary | ICD-10-CM

## 2017-09-06 DIAGNOSIS — I4819 Other persistent atrial fibrillation: Secondary | ICD-10-CM

## 2017-09-06 DIAGNOSIS — R001 Bradycardia, unspecified: Secondary | ICD-10-CM | POA: Diagnosis not present

## 2017-09-06 DIAGNOSIS — I481 Persistent atrial fibrillation: Secondary | ICD-10-CM

## 2017-09-06 LAB — CUP PACEART INCLINIC DEVICE CHECK
Battery Remaining Longevity: 98 mo
Battery Voltage: 3.12 V
Brady Statistic AP VP Percent: 0 %
Brady Statistic AP VS Percent: 0 %
Brady Statistic AS VP Percent: 0 %
Brady Statistic AS VS Percent: 0 %
Brady Statistic RA Percent Paced: 0.31 %
Brady Statistic RV Percent Paced: 29.1 %
Date Time Interrogation Session: 20180921140641
Implantable Lead Implant Date: 20180621
Implantable Lead Implant Date: 20180621
Implantable Lead Location: 753859
Implantable Lead Location: 753860
Implantable Lead Model: 3830
Implantable Lead Model: 5076
Implantable Pulse Generator Implant Date: 20180621
Lead Channel Impedance Value: 266 Ohm
Lead Channel Impedance Value: 285 Ohm
Lead Channel Impedance Value: 532 Ohm
Lead Channel Impedance Value: 570 Ohm
Lead Channel Pacing Threshold Amplitude: 3.25 V
Lead Channel Pacing Threshold Pulse Width: 1 ms
Lead Channel Sensing Intrinsic Amplitude: 0.75 mV
Lead Channel Sensing Intrinsic Amplitude: 1.875 mV
Lead Channel Setting Pacing Amplitude: 3.5 V
Lead Channel Setting Pacing Amplitude: 5 V
Lead Channel Setting Pacing Pulse Width: 1 ms
Lead Channel Setting Sensing Sensitivity: 0.9 mV

## 2017-09-06 MED ORDER — METOPROLOL SUCCINATE ER 25 MG PO TB24: 37.5000 mg | ORAL_TABLET | Freq: Every day | ORAL | 3 refills | Status: DC

## 2017-09-06 NOTE — Patient Instructions (Addendum)
Medication Instructions:  Your physician has recommended you make the following change in your medication:  1) Increase Metoprolol to 37.5 mg nightly    Labwork: None ordered   Testing/Procedures: None ordered   Follow-Up: Your physician recommends that you schedule a follow-up appointment in: 3 weeks with [name], NP and 3 months with Dr [name]  Remote monitoring is used to monitor your  ICD from home. This monitoring reduces the number of office visits required to check your device to one time per year. It allows [place] to keep an eye on the functioning of your device to ensure it is working properly. You are scheduled for a device check from home on 12/11/17. You may send your transmission at any time that day. If you have a wireless device, the transmission will be sent automatically. After your physician reviews your transmission, you will receive a postcard with your next transmission date.      Thank you for choosing [facility]!!     Janan Halter, RN [phone]

## 2017-09-06 NOTE — Progress Notes (Signed)
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  Since her PPM implant, the patient reports doing reasonably well.  She has been in persistent afib but is mostly unaware.  Her V rates are frequently elevated however.  Today, she denies symptoms of palpitations, chest pain, shortness of breath,  lower extremity edema, dizziness, presyncope, or syncope.  The patient is otherwise without complaint today.   Past Medical History:  Diagnosis Date  . Arthritis    RA IN HANDS  . Chronic congestive heart failure with left ventricular diastolic dysfunction ([place])   . Dyspnea   . Dysrhythmia   . Hypertension   . Hypoglycemia   . Nephrolithiasis   . Osteoporosis   . Paroxysmal SVT (supraventricular tachycardia) ([place]) 2014  . Persistent atrial fibrillation ([place])   . Thyroid disease   . Typical atrial flutter [facility])    Past Surgical History:  Procedure Laterality Date  . APPENDECTOMY    . CARDIOVERSION N/A 05/14/2017   Procedure: CARDIOVERSION;  Surgeon: [name], MD;  Location: [place];  Service: Cardiovascular;  Laterality: N/A;  . PACEMAKER IMPLANT N/A 06/06/2017   Procedure: Pacemaker Implant;  Surgeon: [name], MD;  Location: [place] [facility];  Service: Cardiovascular;  Laterality: N/A;    ROS- all systems are reviewed and negative except as per HPI above  Current Outpatient Prescriptions  Medication Sig Dispense Refill  . ELIQUIS 2.5 MG TABS tablet TAKE 1 TABLET BY MOUTH TWICE DAILY 60 tablet 6  . levothyroxine (SYNTHROID, LEVOTHROID) 75 MCG tablet Take 75 mcg by mouth daily before breakfast.    . lisinopril (PRINIVIL,ZESTRIL) 5 MG tablet Take 1 tablet (5 mg total) by mouth daily. 30 tablet 5  . metoprolol succinate (TOPROL XL) 25 MG 24 hr tablet Take 0.5 tablets (12.5 mg total) by mouth at bedtime. 30 tablet 3  . nitroGLYCERIN (NITROSTAT) 0.4 MG SL tablet Place 1  tablet (0.4 mg total) under the tongue every 5 (five) minutes as needed for chest pain. 90 tablet 3   No current facility-administered medications for this visit.     Physical Exam: Vitals:   09/06/17 1146  BP: 118/76  Pulse: 78  SpO2: 98%  Weight: 119 lb 9.6 oz (54.3 kg)  Height: 5\' 2"  (1.575 m)    GEN- The patient is well appearing, alert and oriented x 3 today.   Head- normocephalic, atraumatic Eyes-  Sclera clear, conjunctiva pink Ears- hearing intact Oropharynx- clear Lungs- Clear to ausculation bilaterally, normal work of breathing Chest- pacemaker pocket is well healed Heart- tachycardic irregular rhythm, no murmurs, rubs or gallops, PMI not laterally displaced GI- soft, NT, ND, + BS Extremities- no clubbing, cyanosis, or edema  Pacemaker interrogation- reviewed in detail today,  See PACEART report  ekg tracing ordered today is personally reviewed and shows afib with RVR  Assessment and Plan:  1. Symptomatic mobitz II second degree AV block Normal pacemaker function See Pace Art report His bundle threshold is elevated,  Will continue to follow Would not advise lead revision at this time  2. Persistent afib with RVR Increase toprol.  I have advised 50mg  daily however she prefers to try 35.7mg  daily first.  She will return for further rate control in 2-3 weeks with EP NP.  As she is mostly asymptomatic and would like to avoid AAD therapy, I will continue to pursue rate control at this time.  If she becomes further symptomatic despite rate control, we will reconsider AAD therapy at that time  [name] MD, [facility] 09/06/2017 12:24 PM

## 2017-09-21 NOTE — Progress Notes (Signed)
Electrophysiology Office Note Date: 09/24/2017  ID:  Brandi Chambers, DOB [birth year], MRN [id number]  PCP: [name], MD Electrophysiologist: [name]  CC: AF follow up  Brandi Chambers is a 83 y.o. female seen today for Dr [name].  She presents today for routine electrophysiology followup.  Since last being seen in our clinic, the patient reports doing relatively well.  She has had some increase in shortness of breath since last being seen by Dr [name].   She denies chest pain, nausea, vomiting, dizziness, syncope, edema, weight gain, or early satiety.  Device History: MDT dual chamber PPM (His Bundle) implanted 2018 for tachy/brady   Past Medical History:  Diagnosis Date  . Arthritis    RA IN HANDS  . Chronic congestive heart failure with left ventricular diastolic dysfunction ([place])   . Dyspnea   . Dysrhythmia   . Hypertension   . Hypoglycemia   . Nephrolithiasis   . Osteoporosis   . Paroxysmal SVT (supraventricular tachycardia) ([place]) 2014  . Persistent atrial fibrillation ([place])   . Thyroid disease   . Typical atrial flutter [facility])    Past Surgical History:  Procedure Laterality Date  . APPENDECTOMY    . CARDIOVERSION N/A 05/14/2017   Procedure: CARDIOVERSION;  Surgeon: [name], MD;  Location: [place];  Service: Cardiovascular;  Laterality: N/A;  . PACEMAKER IMPLANT N/A 06/06/2017   Procedure: Pacemaker Implant;  Surgeon: [name], MD;  Location: [place] [facility];  Service: Cardiovascular;  Laterality: N/A;    Current Outpatient Prescriptions  Medication Sig Dispense Refill  . ELIQUIS 2.5 MG TABS tablet TAKE 1 TABLET BY MOUTH TWICE DAILY 60 tablet 6  . levothyroxine (SYNTHROID, LEVOTHROID) 75 MCG tablet Take 75 mcg by mouth daily before breakfast.    . lisinopril (PRINIVIL,ZESTRIL) 5 MG tablet Take 5 mg by mouth daily.    . metoprolol succinate (TOPROL XL) 25 MG 24 hr tablet Take 1 tablet (25 mg total) by mouth 2 (two) times daily. 135 tablet 3   . nitroGLYCERIN (NITROSTAT) 0.4 MG SL tablet Place 1 tablet (0.4 mg total) under the tongue every 5 (five) minutes as needed for chest pain. 90 tablet 3   No current facility-administered medications for this visit.     Allergies:   Patient has no known allergies.   Social History: Social History   Social History  . Marital status: Married    Spouse name: N/A  . Number of children: 1  . Years of education: N/A   Occupational History  . Not on file.   Social History Main Topics  . Smoking status: Former Smoker    Types: Cigarettes    Quit date: 05/18/1969  . Smokeless tobacco: Never Used  . Alcohol use No  . Drug use: No  . Sexual activity: Not on file   Other Topics Concern  . Not on file   Social History Narrative   Lives in [place] with her spouse.  Retired    Family History: Family History  Problem Relation Age of Onset  . Breast cancer Mother   . Lung cancer Father      Review of Systems: All other systems reviewed and are otherwise negative except as noted above.   Physical Exam: VS:  BP 118/80   Pulse 94   Ht 5\' 2"  (1.575 m)   Wt 121 lb 6.4 oz (55.1 kg)   LMP  (LMP Unknown)   BMI 22.20 kg/m  , BMI Body mass index is 22.2  kg/m.  GEN- The patient is elderly appearing, alert and oriented x 3 today.   HEENT: normocephalic, atraumatic; sclera clear, conjunctiva pink; hearing intact; oropharynx clear; neck supple Lungs- Clear to ausculation bilaterally, normal work of breathing.  No wheezes, rales, rhonchi Heart- Tachycardic irregular rate and rhythm  GI- soft, non-tender, non-distended, bowel sounds present Extremities- no clubbing, cyanosis, or edema MS- no significant deformity or atrophy Skin- warm and dry, no rash or lesion; PPM pocket well healed Psych- euthymic mood, full affect Neuro- strength and sensation are intact  PPM Interrogation- reviewed in detail today,  See PACEART report  EKG:  EKG is not ordered today.  Recent  Labs: 04/16/2017: ALT 25; TSH 0.353 06/06/2017: Hemoglobin 13.0; Platelets 241 06/07/2017: BUN 20; Creatinine, Ser 1.36; Potassium 4.7; Sodium 138   Wt Readings from Last 3 Encounters:  09/24/17 121 lb 6.4 oz (55.1 kg)  09/06/17 119 lb 9.6 oz (54.3 kg)  07/19/17 120 lb 4 oz (54.5 kg)     Other studies Reviewed: Additional studies/ records that were reviewed today include: hospital records, AF clinic notes  Assessment and Plan:  1.  Symptomatic bradycardia Normal PPM function See PaceArt report No changes His Bundle threshold unable to be determined 2/2 RVR  2.  Persistent atrial fibrillation/tachy/brady Burden by device interrogation 100% V rates elevated She is more symptomatic since last office visit with Dr [name]; however, V rates have been significantly elevated. Plan to increase Metoprolol today and see her back in 2 weeks.  If rates better controlled and still symptomatic, I think we have 2 options - we can opt for rhythm control (amiodarone or Flecainide) or we could consider AVN ablation.  Her V threshold has been elevated on checks with Dr [name] and so would like to avoid AVN ablation if possible.   3.  HTN Stable No change required today  4.  Chronic diastolic heart failure Euvolemic on exam Continue current therapy  I will reach out to Dr [name] prior to next office visit with me in 2 weeks.   Current medicines are reviewed at length with the patient today.   The patient does not have concerns regarding her medicines.  The following changes were made today:  increase Metoprolol to 25mg  twice daily   Labs/ tests ordered today include: none No orders of the defined types were placed in this encounter.    Disposition:   Follow up with me in 2 weeks   Signed, [name], NP 09/24/2017 12:46 PM  [place] [address] [phone] (office) [phone] (fax)

## 2017-09-24 ENCOUNTER — Ambulatory Visit (INDEPENDENT_AMBULATORY_CARE_PROVIDER_SITE_OTHER): Payer: PPO | Admitting: Nurse Practitioner

## 2017-09-24 VITALS — BP 118/80 | HR 94 | Ht 62.0 in | Wt 121.4 lb

## 2017-09-24 DIAGNOSIS — R001 Bradycardia, unspecified: Secondary | ICD-10-CM

## 2017-09-24 DIAGNOSIS — I4819 Other persistent atrial fibrillation: Secondary | ICD-10-CM

## 2017-09-24 DIAGNOSIS — I5032 Chronic diastolic (congestive) heart failure: Secondary | ICD-10-CM

## 2017-09-24 DIAGNOSIS — I481 Persistent atrial fibrillation: Secondary | ICD-10-CM

## 2017-09-24 DIAGNOSIS — I495 Sick sinus syndrome: Secondary | ICD-10-CM | POA: Diagnosis not present

## 2017-09-24 DIAGNOSIS — I10 Essential (primary) hypertension: Secondary | ICD-10-CM | POA: Diagnosis not present

## 2017-09-24 LAB — CUP PACEART INCLINIC DEVICE CHECK
Date Time Interrogation Session: 20181009125341
Implantable Lead Implant Date: 20180621
Implantable Lead Implant Date: 20180621
Implantable Lead Location: 753859
Implantable Lead Location: 753860
Implantable Lead Model: 3830
Implantable Lead Model: 5076
Implantable Pulse Generator Implant Date: 20180621

## 2017-09-24 MED ORDER — METOPROLOL SUCCINATE ER 25 MG PO TB24: 25.0000 mg | ORAL_TABLET | Freq: Two times a day (BID) | ORAL | 3 refills | Status: DC

## 2017-09-24 NOTE — Patient Instructions (Signed)
Medication Instructions:   START TAKING METOPROLOL 25 MG TWICE A DAY   If you need a refill on your cardiac medications before your next appointment, please call your pharmacy.  Labwork:  NONE ORDERED  TODAY    Testing/Procedures: NONE ORDERED  TODAY    Follow-Up:  IN 2 WEEKS  WITH AMBER SEILER    Any Other Special Instructions Will Be Listed Below (If Applicable).

## 2017-10-08 NOTE — Progress Notes (Signed)
Electrophysiology Office Note Date: 10/09/2017  ID:  Brandi Chambers, DOB October 31, 1932, MRN [id number]  PCP: [name], MD Electrophysiologist: [name]  CC: AF follow up  Brandi Chambers is a 83 y.o. female seen today for Dr [name].  She presents today for routine electrophysiology followup.  Since last being seen in our clinic, the patient reports doing relatively well.  Shortness of breath is improved on higher dose of BB since being seen by me. Heart rates remain elevated. She denies chest pain, nausea, vomiting, dizziness, syncope, edema, weight gain, or early satiety.  Device History: MDT dual chamber PPM (His Bundle) implanted 2018 for tachy/brady   Past Medical History:  Diagnosis Date  . Arthritis    RA IN HANDS  . Chronic congestive heart failure with left ventricular diastolic dysfunction ([place])   . Dyspnea   . Dysrhythmia   . Hypertension   . Hypoglycemia   . Nephrolithiasis   . Osteoporosis   . Paroxysmal SVT (supraventricular tachycardia) ([place]) 2014  . Persistent atrial fibrillation ([place])   . Thyroid disease   . Typical atrial flutter [facility])    Past Surgical History:  Procedure Laterality Date  . APPENDECTOMY    . CARDIOVERSION N/A 05/14/2017   Procedure: CARDIOVERSION;  Surgeon: [name], MD;  Location: [place];  Service: Cardiovascular;  Laterality: N/A;  . PACEMAKER IMPLANT N/A 06/06/2017   Procedure: Pacemaker Implant;  Surgeon: [name], MD;  Location: [place] [facility];  Service: Cardiovascular;  Laterality: N/A;    Current Outpatient Prescriptions  Medication Sig Dispense Refill  . ELIQUIS 2.5 MG TABS tablet TAKE 1 TABLET BY MOUTH TWICE DAILY 60 tablet 6  . levothyroxine (SYNTHROID, LEVOTHROID) 75 MCG tablet Take 75 mcg by mouth daily before breakfast.    . lisinopril (PRINIVIL,ZESTRIL) 5 MG tablet Take 5 mg by mouth daily.    . metoprolol succinate (TOPROL XL) 25 MG 24 hr tablet Take 1 tablet (25 mg total) by mouth 2 (two) times  daily. 135 tablet 3  . nitroGLYCERIN (NITROSTAT) 0.4 MG SL tablet Place 1 tablet (0.4 mg total) under the tongue every 5 (five) minutes as needed for chest pain. 90 tablet 3   No current facility-administered medications for this visit.     Allergies:   Patient has no known allergies.   Social History: Social History   Social History  . Marital status: Married    Spouse name: N/A  . Number of children: 1  . Years of education: N/A   Occupational History  . Not on file.   Social History Main Topics  . Smoking status: Former Smoker    Types: Cigarettes    Quit date: 05/18/1969  . Smokeless tobacco: Never Used  . Alcohol use No  . Drug use: No  . Sexual activity: Not on file   Other Topics Concern  . Not on file   Social History Narrative   Lives in [place] with her spouse.  Retired    Family History: Family History  Problem Relation Age of Onset  . Breast cancer Mother   . Lung cancer Father      Review of Systems: All other systems reviewed and are otherwise negative except as noted above.   Physical Exam: VS:  BP 124/78   Pulse (!) 120   Ht 5\' 2"  (1.575 m)   Wt 122 lb (55.3 kg)   LMP  (LMP Unknown)   SpO2 94%   BMI 22.31 kg/m  , BMI Body  mass index is 22.31 kg/m.  GEN- The patient is elderly appearing, alert and oriented x 3 today.   HEENT: normocephalic, atraumatic; sclera clear, conjunctiva pink; hearing intact; oropharynx clear; neck supple Lungs- Clear to ausculation bilaterally, normal work of breathing.  No wheezes, rales, rhonchi Heart- Tachycardic irregular rate and rhythm  GI- soft, non-tender, non-distended, bowel sounds present Extremities- no clubbing, cyanosis, or edema MS- no significant deformity or atrophy Skin- warm and dry, no rash or lesion; PPM pocket well healed Psych- euthymic mood, full affect Neuro- strength and sensation are intact  PPM Interrogation- reviewed in detail today,  See PACEART report  EKG:  EKG is not ordered  today.  Recent Labs: 04/16/2017: ALT 25; TSH 0.353 06/06/2017: Hemoglobin 13.0; Platelets 241 06/07/2017: BUN 20; Creatinine, Ser 1.36; Potassium 4.7; Sodium 138   Wt Readings from Last 3 Encounters:  10/09/17 122 lb (55.3 kg)  09/24/17 121 lb 6.4 oz (55.1 kg)  09/06/17 119 lb 9.6 oz (54.3 kg)     Other studies Reviewed: Additional studies/ records that were reviewed today include: hospital records, AF clinic notes  Assessment and Plan:  1.  Symptomatic bradycardia Normal PPM function See PaceArt report No changes His Bundle threshold unable to be determined 2/2 RVR  2.  Persistent atrial fibrillation/tachy/brady Burden by device interrogation 100% V rates remain elevated  Symptoms are somewhat improved with better rate control, but V rates still fast. Discussed with Dr [name] after last office visit. Will plan restoration of SR as first therapy.  Start flecainide 50mg  twice daily today. Follow up with AF clinic next week with EKG.  If we are unable to maintain SR and/or achieve adequate rate control, AVN ablation can be considered.   3.  HTN Stable No change required today  4.  Chronic diastolic heart failure Euvolemic on exam Continue current therapy   Current medicines are reviewed at length with the patient today.   The patient does not have concerns regarding her medicines.  The following changes were made today: start Flecainide 50mg  twice daily  Labs/ tests ordered today include: none No orders of the defined types were placed in this encounter.    Disposition:   Follow up with AF clinic next week    Signed, [name], NP 10/09/2017 10:05 AM  [facility] [address] [place] [address] [phone] (office) [phone] (fax)

## 2017-10-09 ENCOUNTER — Encounter: Payer: Self-pay | Admitting: Nurse Practitioner

## 2017-10-09 ENCOUNTER — Ambulatory Visit (INDEPENDENT_AMBULATORY_CARE_PROVIDER_SITE_OTHER): Payer: PPO | Admitting: Nurse Practitioner

## 2017-10-09 VITALS — BP 124/78 | HR 120 | Ht 62.0 in | Wt 122.0 lb

## 2017-10-09 DIAGNOSIS — I495 Sick sinus syndrome: Secondary | ICD-10-CM | POA: Diagnosis not present

## 2017-10-09 DIAGNOSIS — I5032 Chronic diastolic (congestive) heart failure: Secondary | ICD-10-CM

## 2017-10-09 DIAGNOSIS — I4819 Other persistent atrial fibrillation: Secondary | ICD-10-CM

## 2017-10-09 DIAGNOSIS — I481 Persistent atrial fibrillation: Secondary | ICD-10-CM

## 2017-10-09 DIAGNOSIS — I10 Essential (primary) hypertension: Secondary | ICD-10-CM

## 2017-10-09 MED ORDER — FLECAINIDE ACETATE 50 MG PO TABS: 50.0000 mg | ORAL_TABLET | Freq: Two times a day (BID) | ORAL | 3 refills | Status: DC

## 2017-10-09 NOTE — Patient Instructions (Signed)
Medication Instructions:   START TAKING FLECAINIDE  50 MG TWICE A DAY   If you need a refill on your cardiac medications before your next appointment, please call your pharmacy.  Labwork: NONE ORDERED  TODAY    Testing/Procedures: NONE ORDERED  TODAY    Follow-Up: AS SCHEDULED   Any Other Special Instructions Will Be Listed Below (If Applicable).

## 2017-10-10 LAB — CUP PACEART INCLINIC DEVICE CHECK
Date Time Interrogation Session: 20181025080150
Implantable Lead Implant Date: 20180621
Implantable Lead Implant Date: 20180621
Implantable Lead Location: 753859
Implantable Lead Location: 753860
Implantable Lead Model: 3830
Implantable Lead Model: 5076
Implantable Pulse Generator Implant Date: 20180621

## 2017-10-15 DIAGNOSIS — L570 Actinic keratosis: Secondary | ICD-10-CM | POA: Diagnosis not present

## 2017-10-15 DIAGNOSIS — I872 Venous insufficiency (chronic) (peripheral): Secondary | ICD-10-CM | POA: Diagnosis not present

## 2017-10-15 DIAGNOSIS — Z85828 Personal history of other malignant neoplasm of skin: Secondary | ICD-10-CM | POA: Diagnosis not present

## 2017-10-16 ENCOUNTER — Encounter (HOSPITAL_COMMUNITY): Payer: Self-pay | Admitting: Nurse Practitioner

## 2017-10-16 ENCOUNTER — Ambulatory Visit (HOSPITAL_COMMUNITY)
Admission: RE | Admit: 2017-10-16 | Discharge: 2017-10-16 | Disposition: A | Discharge status: Home / Self Care | Payer: PPO | Source: Ambulatory Visit | Attending: Nurse Practitioner | Admitting: Nurse Practitioner

## 2017-10-16 VITALS — BP 144/86 | HR 64 | Ht 62.0 in | Wt 123.0 lb

## 2017-10-16 DIAGNOSIS — Z803 Family history of malignant neoplasm of breast: Secondary | ICD-10-CM | POA: Diagnosis not present

## 2017-10-16 DIAGNOSIS — I471 Supraventricular tachycardia: Secondary | ICD-10-CM | POA: Insufficient documentation

## 2017-10-16 DIAGNOSIS — I5032 Chronic diastolic (congestive) heart failure: Secondary | ICD-10-CM | POA: Diagnosis not present

## 2017-10-16 DIAGNOSIS — M06841 Other specified rheumatoid arthritis, right hand: Secondary | ICD-10-CM | POA: Diagnosis not present

## 2017-10-16 DIAGNOSIS — Z87891 Personal history of nicotine dependence: Secondary | ICD-10-CM | POA: Insufficient documentation

## 2017-10-16 DIAGNOSIS — Z9889 Other specified postprocedural states: Secondary | ICD-10-CM | POA: Insufficient documentation

## 2017-10-16 DIAGNOSIS — Z79899 Other long term (current) drug therapy: Secondary | ICD-10-CM | POA: Diagnosis not present

## 2017-10-16 DIAGNOSIS — Z801 Family history of malignant neoplasm of trachea, bronchus and lung: Secondary | ICD-10-CM | POA: Insufficient documentation

## 2017-10-16 DIAGNOSIS — E079 Disorder of thyroid, unspecified: Secondary | ICD-10-CM | POA: Diagnosis not present

## 2017-10-16 DIAGNOSIS — I481 Persistent atrial fibrillation: Secondary | ICD-10-CM | POA: Diagnosis not present

## 2017-10-16 DIAGNOSIS — Z87442 Personal history of urinary calculi: Secondary | ICD-10-CM | POA: Diagnosis not present

## 2017-10-16 DIAGNOSIS — I483 Typical atrial flutter: Secondary | ICD-10-CM | POA: Insufficient documentation

## 2017-10-16 DIAGNOSIS — Z950 Presence of cardiac pacemaker: Secondary | ICD-10-CM | POA: Insufficient documentation

## 2017-10-16 DIAGNOSIS — Z7902 Long term (current) use of antithrombotics/antiplatelets: Secondary | ICD-10-CM | POA: Diagnosis not present

## 2017-10-16 DIAGNOSIS — M06842 Other specified rheumatoid arthritis, left hand: Secondary | ICD-10-CM | POA: Diagnosis not present

## 2017-10-16 DIAGNOSIS — I110 Hypertensive heart disease with heart failure: Secondary | ICD-10-CM | POA: Diagnosis not present

## 2017-10-16 DIAGNOSIS — I4819 Other persistent atrial fibrillation: Secondary | ICD-10-CM

## 2017-10-16 DIAGNOSIS — I495 Sick sinus syndrome: Secondary | ICD-10-CM | POA: Diagnosis not present

## 2017-10-16 NOTE — Progress Notes (Signed)
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 She is in a paced rhythm today. Limited interrogation shows continued afib. She feels ok on flecainide, may feel a little better.   Today, she denies symptoms of palpitations, chest pain, shortness of breath, orthopnea, PND, lower extremity edema, dizziness, presyncope, syncope, or neurologic sequela. The patient is tolerating medications without difficulties and is otherwise without complaint today.   Past Medical History:  Diagnosis Date  . Arthritis    RA IN HANDS  . Chronic congestive heart failure with left ventricular diastolic dysfunction ([place])   . Dyspnea   . Dysrhythmia   . Hypertension   . Hypoglycemia   . Nephrolithiasis   . Osteoporosis   . Paroxysmal SVT (supraventricular tachycardia) ([place]) 2014  . Persistent atrial fibrillation ([place])   . Thyroid disease   . Typical atrial flutter [facility])    Past Surgical History:  Procedure Laterality Date  . APPENDECTOMY    . CARDIOVERSION N/A 05/14/2017   Procedure: CARDIOVERSION;  Surgeon: [name], MD;  Location: [facility];  Service: Cardiovascular;  Laterality: N/A;  . PACEMAKER IMPLANT N/A 06/06/2017   Procedure: Pacemaker Implant;  Surgeon: [name], MD;  Location: [facility] [facility];  Service: Cardiovascular;  Laterality: N/A;    Current Outpatient Prescriptions  Medication Sig Dispense Refill  . ELIQUIS 2.5 MG TABS tablet TAKE 1 TABLET BY MOUTH TWICE DAILY 60 tablet 6  . flecainide (TAMBOCOR) 50 MG tablet Take 1 tablet (50 mg total) by mouth 2 (two) times daily. 60 tablet 3  . levothyroxine (SYNTHROID, LEVOTHROID) 75 MCG tablet Take 75 mcg by mouth daily before breakfast.    . lisinopril (PRINIVIL,ZESTRIL) 5 MG tablet Take 5 mg by mouth daily.    . metoprolol succinate (TOPROL XL) 25 MG 24  hr tablet Take 1 tablet (25 mg total) by mouth 2 (two) times daily. 135 tablet 3  . nitroGLYCERIN (NITROSTAT) 0.4 MG SL tablet Place 1 tablet (0.4 mg total) under the tongue every 5 (five) minutes as needed for chest pain. 90 tablet 3   No current facility-administered medications for this encounter.     No Known Allergies  Social History   Social History  . Marital status: Married    Spouse name: N/A  . Number of children: 1  . Years of education: N/A   Occupational History  . Not on file.   Social History Main Topics  . Smoking status: Former Smoker    Types: Cigarettes    Quit date: 05/18/1969  . Smokeless tobacco: Never Used  . Alcohol use No  . Drug use: No  . Sexual activity: Not on file   Other Topics Concern  . Not on file   Social History Narrative   Lives in [place] with her spouse.  Retired    Family History  Problem Relation Age of Onset  . Breast cancer Mother   . Lung cancer Father     ROS- All systems are reviewed and negative except as per the HPI above  Physical Exam: Vitals:   10/16/17 0948  BP: (!) 144/86  Pulse: 64  Weight: 123 lb (55.8 kg)  Height: 5\' 2"  (1.575 m)   Wt Readings from Last 3 Encounters:  10/16/17 123 lb (55.8 kg)  10/09/17 122 lb (55.3 kg)  09/24/17 121 lb 6.4 oz (55.1 kg)    Labs: Lab Results  Component Value Date   NA 138 06/07/2017   K 4.7 06/07/2017   CL 108 06/07/2017   CO2 23 06/07/2017   GLUCOSE 96 06/07/2017   BUN 20 06/07/2017   CREATININE 1.36 (H) 06/07/2017   CALCIUM 8.6 (L) 06/07/2017   No results found for: INR No results found for: CHOL, HDL, LDLCALC, TRIG   GEN- The patient is well appearing, alert and oriented x 3 today.   Head- normocephalic, atraumatic Eyes-  Sclera clear, conjunctiva pink Ears- hearing intact Oropharynx- clear Neck- supple, no JVP Lymph- no cervical lymphadenopathy Lungs- Clear to ausculation bilaterally, normal work of breathing Heart- Regular paced, no murmurs, rubs  or gallops, PMI not laterally displaced GI- soft, NT, ND, + BS Extremities- no clubbing, cyanosis, or edema MS- no significant deformity or atrophy Skin- no rash or lesion Psych- euthymic mood, full affect Neuro- strength and sensation are intact  EKG-paced rhythm at  69 bpm, qrs int 124 ms, qtc 525 ms EPIC records reviewed Limited nterrogated with continuation of afib   Assessment and Plan: 1. Afib with rvr/tachy/brady syndrome Is currently on flecainide 50 mg bid  Discussed with Dr. [name] and will increase flecainide to 100 mg bid and bring back in one week If still in afib will schedule for cardioversion per Dr. Jackalyn Lombard plan  Continue Eliquis 2.5 mg bid for a chadsvasc score of at least 5  Recheck in one week  Butch Penny C. Amante Fomby, [facility] [address] [place], [place] [address] [phone]

## 2017-10-21 DIAGNOSIS — E038 Other specified hypothyroidism: Secondary | ICD-10-CM | POA: Diagnosis not present

## 2017-10-21 DIAGNOSIS — E7849 Other hyperlipidemia: Secondary | ICD-10-CM | POA: Diagnosis not present

## 2017-10-21 DIAGNOSIS — M810 Age-related osteoporosis without current pathological fracture: Secondary | ICD-10-CM | POA: Diagnosis not present

## 2017-10-21 DIAGNOSIS — R82998 Other abnormal findings in urine: Secondary | ICD-10-CM | POA: Diagnosis not present

## 2017-10-22 ENCOUNTER — Ambulatory Visit (HOSPITAL_COMMUNITY)
Admission: RE | Admit: 2017-10-22 | Discharge: 2017-10-22 | Disposition: A | Discharge status: Home / Self Care | Payer: PPO | Source: Ambulatory Visit | Attending: Nurse Practitioner | Admitting: Nurse Practitioner

## 2017-10-22 ENCOUNTER — Encounter (HOSPITAL_COMMUNITY): Payer: Self-pay | Admitting: Nurse Practitioner

## 2017-10-22 VITALS — BP 144/88 | HR 62 | Ht 62.0 in | Wt 127.6 lb

## 2017-10-22 DIAGNOSIS — E079 Disorder of thyroid, unspecified: Secondary | ICD-10-CM | POA: Diagnosis not present

## 2017-10-22 DIAGNOSIS — Z79899 Other long term (current) drug therapy: Secondary | ICD-10-CM | POA: Diagnosis not present

## 2017-10-22 DIAGNOSIS — Z950 Presence of cardiac pacemaker: Secondary | ICD-10-CM | POA: Diagnosis not present

## 2017-10-22 DIAGNOSIS — I495 Sick sinus syndrome: Secondary | ICD-10-CM | POA: Insufficient documentation

## 2017-10-22 DIAGNOSIS — Z87442 Personal history of urinary calculi: Secondary | ICD-10-CM | POA: Insufficient documentation

## 2017-10-22 DIAGNOSIS — Z6823 Body mass index (BMI) 23.0-23.9, adult: Secondary | ICD-10-CM | POA: Diagnosis not present

## 2017-10-22 DIAGNOSIS — Z7901 Long term (current) use of anticoagulants: Secondary | ICD-10-CM | POA: Diagnosis not present

## 2017-10-22 DIAGNOSIS — R635 Abnormal weight gain: Secondary | ICD-10-CM | POA: Insufficient documentation

## 2017-10-22 DIAGNOSIS — I481 Persistent atrial fibrillation: Secondary | ICD-10-CM | POA: Diagnosis not present

## 2017-10-22 DIAGNOSIS — Z803 Family history of malignant neoplasm of breast: Secondary | ICD-10-CM | POA: Insufficient documentation

## 2017-10-22 DIAGNOSIS — I509 Heart failure, unspecified: Secondary | ICD-10-CM | POA: Diagnosis not present

## 2017-10-22 DIAGNOSIS — I110 Hypertensive heart disease with heart failure: Secondary | ICD-10-CM | POA: Diagnosis not present

## 2017-10-22 DIAGNOSIS — Z801 Family history of malignant neoplasm of trachea, bronchus and lung: Secondary | ICD-10-CM | POA: Insufficient documentation

## 2017-10-22 DIAGNOSIS — Z87891 Personal history of nicotine dependence: Secondary | ICD-10-CM | POA: Insufficient documentation

## 2017-10-22 DIAGNOSIS — M069 Rheumatoid arthritis, unspecified: Secondary | ICD-10-CM | POA: Diagnosis not present

## 2017-10-22 DIAGNOSIS — I4819 Other persistent atrial fibrillation: Secondary | ICD-10-CM

## 2017-10-22 DIAGNOSIS — I4891 Unspecified atrial fibrillation: Secondary | ICD-10-CM | POA: Diagnosis present

## 2017-10-22 LAB — BASIC METABOLIC PANEL
Anion gap: 7 (ref 5–15)
BUN: 26 mg/dL — ABNORMAL HIGH (ref 6–20)
CO2: 27 mmol/L (ref 22–32)
Calcium: 9.3 mg/dL (ref 8.9–10.3)
Chloride: 101 mmol/L (ref 101–111)
Creatinine, Ser: 1.23 mg/dL — ABNORMAL HIGH (ref 0.44–1.00)
GFR calc Af Amer: 46 mL/min — ABNORMAL LOW (ref >60)
GFR calc non Af Amer: 39 mL/min — ABNORMAL LOW (ref >60)
Glucose, Bld: 91 mg/dL (ref 65–99)
Potassium: 4.7 mmol/L (ref 3.5–5.1)
Sodium: 135 mmol/L (ref 135–145)

## 2017-10-22 MED ORDER — FUROSEMIDE 20 MG PO TABS: 20.0000 mg | ORAL_TABLET | Freq: Every day | ORAL | 2 refills | Status: DC

## 2017-10-22 NOTE — Patient Instructions (Signed)
Start Lasix (Furosemide) 20mg  twice a day for 3 days then reduce to 1 tablet a day

## 2017-10-22 NOTE — Progress Notes (Signed)
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 She is in a paced rhythm today. Limited interrogation shows continued afib. She feels ok on flecainide, may feel a little better.   In afib clinic,11/6, for f/u for increase in flecainide to 100 mg. She feels ok on higher dose but LLE is worse and her weight is up 4 lbs. Interrogation today shows SR.  Today, she denies symptoms of palpitations, chest pain, shortness of breath, orthopnea, PND, lower extremity edema, dizziness, presyncope, syncope, or neurologic sequela. The patient is tolerating medications without difficulties and is otherwise without complaint today.   Past Medical History:  Diagnosis Date  . Arthritis    RA IN HANDS  . Chronic congestive heart failure with left ventricular diastolic dysfunction ([place])   . Dyspnea   . Dysrhythmia   . Hypertension   . Hypoglycemia   . Nephrolithiasis   . Osteoporosis   . Paroxysmal SVT (supraventricular tachycardia) ([place]) 2014  . Persistent atrial fibrillation ([place])   . Thyroid disease   . Typical atrial flutter [facility])    Past Surgical History:  Procedure Laterality Date  . APPENDECTOMY      Current Outpatient Medications  Medication Sig Dispense Refill  . ELIQUIS 2.5 MG TABS tablet TAKE 1 TABLET BY MOUTH TWICE DAILY 60 tablet 6  . flecainide (TAMBOCOR) 50 MG tablet Take 1 tablet (50 mg total) by mouth 2 (two) times daily. 60 tablet 3  . levothyroxine (SYNTHROID, LEVOTHROID) 75 MCG tablet Take 75 mcg by mouth daily before breakfast.    . lisinopril (PRINIVIL,ZESTRIL) 5 MG tablet Take 5 mg by mouth daily.    . metoprolol succinate (TOPROL XL) 25 MG 24 hr tablet Take 1 tablet (25 mg total) by mouth 2 (two) times daily. 135 tablet 3  . nitroGLYCERIN (NITROSTAT) 0.4 MG SL tablet Place 1 tablet (0.4 mg total) under  the tongue every 5 (five) minutes as needed for chest pain. 90 tablet 3   No current facility-administered medications for this encounter.     No Known Allergies  Social History   Socioeconomic History  . Marital status: Married    Spouse name: Not on file  . Number of children: 1  . Years of education: Not on file  . Highest education level: Not on file  Social Needs  . Financial resource strain: Not on file  . Food insecurity - worry: Not on file  . Food insecurity - inability: Not on file  . Transportation needs - medical: Not on file  . Transportation needs - non-medical: Not on file  Occupational History  . Not on file  Tobacco Use  . Smoking status: Former Smoker    Types: Cigarettes    Last attempt to quit: 05/18/1969    Years since quitting: 48.4  . Smokeless tobacco: Never Used  Substance and Sexual Activity  . Alcohol use: No  . Drug use: No  . Sexual activity: Not on file  Other Topics Concern  . Not on file  Social History Narrative   Lives in [place] with her spouse.  Retired    Family History  Problem Relation Age of Onset  . Breast cancer Mother   . Lung cancer Father     ROS- All systems are reviewed and negative except as per the HPI  above  Physical Exam: Vitals:   10/22/17 1029  BP: (!) 144/88  Pulse: 62  Weight: 127 lb 9.6 oz (57.9 kg)  Height: 5\' 2"  (1.575 m)   Wt Readings from Last 3 Encounters:  10/22/17 127 lb 9.6 oz (57.9 kg)  10/16/17 123 lb (55.8 kg)  10/09/17 122 lb (55.3 kg)    Labs: Lab Results  Component Value Date   NA 138 06/07/2017   K 4.7 06/07/2017   CL 108 06/07/2017   CO2 23 06/07/2017   GLUCOSE 96 06/07/2017   BUN 20 06/07/2017   CREATININE 1.36 (H) 06/07/2017   CALCIUM 8.6 (L) 06/07/2017   No results found for: INR No results found for: CHOL, HDL, LDLCALC, TRIG   GEN- The patient is well appearing, alert and oriented x 3 today.   Head- normocephalic, atraumatic Eyes-  Sclera clear, conjunctiva  pink Ears- hearing intact Oropharynx- clear Neck- supple, no JVP Lymph- no cervical lymphadenopathy Lungs- Clear to ausculation bilaterally, normal work of breathing Heart  paced, no murmurs, rubs or gallops, PMI not laterally displaced GI- soft, NT, ND, + BS Extremities- no clubbing, cyanosis, or edema MS- no significant deformity or atrophy Skin- no rash or lesion Psych- euthymic mood, full affect Neuro- strength and sensation are intact  EKG-paced rhythm at  62 bpm, qrs int 158 ms, qtc 552 ms (reviewed with Dr. [name], thinks ok but flecainde level will be done) EPIC records reviewed Limited interrogation, now with SR, Tomi Bamberger reviewed with Dr. [name] and will turn on reactive ATP, and she also adjusted atrial threshold 1.25 at .8 ms   Assessment and Plan: 1. Afib with rvr/tachy/brady syndrome Has resumed SR Is currently on flecainide 100 mg bid  Flecainide  Level today Continue Eliquis 2.5 mg bid for a chadsvasc score of at least 5   2. Weight gain/LLE Will add 20 mg lasix bid x 3 days then 20 mg a day bmet today  Recheck in one week  Butch Penny C. Kutter Schnepf, [facility] [address] [place], [place] [address] [phone]

## 2017-10-23 ENCOUNTER — Telehealth (HOSPITAL_COMMUNITY): Payer: Self-pay | Admitting: *Deleted

## 2017-10-23 LAB — FLECAINIDE LEVEL: Flecainide: 1.27 ug/mL — ABNORMAL HIGH (ref 0.20–1.00)

## 2017-10-23 NOTE — Telephone Encounter (Signed)
Pt called in stating since increase flecainide to 100mg  BID she has been having diarrhea and upset stomach -- did not mention at appointment yesterday. Discussed with Roderic Palau NP -- will decrease flecainide back to 50mg  BID. Pt did not take lasix yesterday afternoon due to diarrhea but will take this morning. Pt has follow up next week but will call if issues before then.

## 2017-10-28 ENCOUNTER — Other Ambulatory Visit: Payer: Self-pay | Admitting: Internal Medicine

## 2017-10-29 ENCOUNTER — Ambulatory Visit (HOSPITAL_COMMUNITY)
Admission: RE | Admit: 2017-10-29 | Discharge: 2017-10-29 | Disposition: A | Discharge status: Home / Self Care | Payer: PPO | Source: Ambulatory Visit | Attending: Nurse Practitioner | Admitting: Nurse Practitioner

## 2017-10-29 ENCOUNTER — Other Ambulatory Visit: Payer: Self-pay

## 2017-10-29 ENCOUNTER — Encounter (HOSPITAL_COMMUNITY): Payer: Self-pay | Admitting: Nurse Practitioner

## 2017-10-29 VITALS — BP 142/94 | HR 64 | Ht 62.0 in | Wt 120.0 lb

## 2017-10-29 DIAGNOSIS — E7849 Other hyperlipidemia: Secondary | ICD-10-CM | POA: Diagnosis not present

## 2017-10-29 DIAGNOSIS — M810 Age-related osteoporosis without current pathological fracture: Secondary | ICD-10-CM | POA: Diagnosis not present

## 2017-10-29 DIAGNOSIS — Z79899 Other long term (current) drug therapy: Secondary | ICD-10-CM | POA: Diagnosis not present

## 2017-10-29 DIAGNOSIS — J302 Other seasonal allergic rhinitis: Secondary | ICD-10-CM | POA: Diagnosis not present

## 2017-10-29 DIAGNOSIS — R001 Bradycardia, unspecified: Secondary | ICD-10-CM | POA: Diagnosis not present

## 2017-10-29 DIAGNOSIS — I480 Paroxysmal atrial fibrillation: Secondary | ICD-10-CM

## 2017-10-29 DIAGNOSIS — I509 Heart failure, unspecified: Secondary | ICD-10-CM | POA: Diagnosis not present

## 2017-10-29 DIAGNOSIS — E038 Other specified hypothyroidism: Secondary | ICD-10-CM | POA: Diagnosis not present

## 2017-10-29 DIAGNOSIS — I4891 Unspecified atrial fibrillation: Secondary | ICD-10-CM | POA: Insufficient documentation

## 2017-10-29 DIAGNOSIS — Z1389 Encounter for screening for other disorder: Secondary | ICD-10-CM | POA: Diagnosis not present

## 2017-10-29 DIAGNOSIS — Z7901 Long term (current) use of anticoagulants: Secondary | ICD-10-CM | POA: Diagnosis not present

## 2017-10-29 DIAGNOSIS — I10 Essential (primary) hypertension: Secondary | ICD-10-CM | POA: Diagnosis not present

## 2017-10-29 DIAGNOSIS — Z6821 Body mass index (BMI) 21.0-21.9, adult: Secondary | ICD-10-CM | POA: Diagnosis not present

## 2017-10-29 DIAGNOSIS — R945 Abnormal results of liver function studies: Secondary | ICD-10-CM | POA: Diagnosis not present

## 2017-10-29 DIAGNOSIS — R635 Abnormal weight gain: Secondary | ICD-10-CM | POA: Diagnosis not present

## 2017-10-29 DIAGNOSIS — I110 Hypertensive heart disease with heart failure: Secondary | ICD-10-CM | POA: Insufficient documentation

## 2017-10-29 DIAGNOSIS — Z0000 Encounter for general adult medical examination without abnormal findings: Secondary | ICD-10-CM | POA: Diagnosis not present

## 2017-10-29 DIAGNOSIS — Z87891 Personal history of nicotine dependence: Secondary | ICD-10-CM | POA: Insufficient documentation

## 2017-10-29 DIAGNOSIS — I5032 Chronic diastolic (congestive) heart failure: Secondary | ICD-10-CM | POA: Diagnosis not present

## 2017-10-29 DIAGNOSIS — Z6822 Body mass index (BMI) 22.0-22.9, adult: Secondary | ICD-10-CM | POA: Diagnosis not present

## 2017-10-29 LAB — BASIC METABOLIC PANEL
Anion gap: 5 (ref 5–15)
BUN: 35 mg/dL — ABNORMAL HIGH (ref 6–20)
CO2: 27 mmol/L (ref 22–32)
Calcium: 9 mg/dL (ref 8.9–10.3)
Chloride: 101 mmol/L (ref 101–111)
Creatinine, Ser: 1.84 mg/dL — ABNORMAL HIGH (ref 0.44–1.00)
GFR calc Af Amer: 28 mL/min — ABNORMAL LOW (ref >60)
GFR calc non Af Amer: 24 mL/min — ABNORMAL LOW (ref >60)
Glucose, Bld: 88 mg/dL (ref 65–99)
Potassium: 4.5 mmol/L (ref 3.5–5.1)
Sodium: 133 mmol/L — ABNORMAL LOW (ref 135–145)

## 2017-10-29 NOTE — Progress Notes (Signed)
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 She is in a paced rhythm today. Limited interrogation shows continued afib. She feels ok on flecainide, may feel a little better.   In afib clinic,11/6, for f/u for increase in flecainide to 100 mg. She feels ok on higher dose but LLE is worse and her weight is up 4 lbs. Interrogation today shows SR.   F/u in [facility] clinic, 11/13, on last visit, flecainide level was done and was high. When called to reduce dose of flecainide to 50 mg bid, she reported that she had developed GI symptoms. This did improve with dose reduction and LLE has also improved with daily lasix and weight is down from 127 to 120 mg . She is in AV paced rhythm today and states that she is feeling better.   Today, she denies symptoms of palpitations, chest pain, shortness of breath, orthopnea, PND, lower extremity edema, dizziness, presyncope, syncope, or neurologic sequela. The patient is tolerating medications without difficulties and is otherwise without complaint today.   Past Medical History:  Diagnosis Date  . Arthritis    RA IN HANDS  . Chronic congestive heart failure with left ventricular diastolic dysfunction ([place])   . Dyspnea   . Dysrhythmia   . Hypertension   . Hypoglycemia   . Nephrolithiasis   . Osteoporosis   . Paroxysmal SVT (supraventricular tachycardia) ([place]) 2014  . Persistent atrial fibrillation ([place])   . Thyroid disease   . Typical atrial flutter [facility])    Past Surgical History:  Procedure Laterality Date  . APPENDECTOMY      Current Outpatient Medications  Medication Sig Dispense Refill  . ELIQUIS 2.5 MG TABS tablet TAKE 1 TABLET BY MOUTH TWICE DAILY 60 tablet 6  . flecainide (TAMBOCOR) 50 MG tablet Take 1 tablet (50 mg total) by mouth 2 (two) times daily. 60 tablet 3  .  furosemide (LASIX) 20 MG tablet Take 1 tablet (20 mg total) daily by mouth. May take extra tablet in the afternoon for weight gain 36 tablet 2  . levothyroxine (SYNTHROID, LEVOTHROID) 75 MCG tablet Take 75 mcg by mouth daily before breakfast.    . lisinopril (PRINIVIL,ZESTRIL) 5 MG tablet Take 5 mg by mouth daily.    . metoprolol succinate (TOPROL XL) 25 MG 24 hr tablet Take 1 tablet (25 mg total) by mouth 2 (two) times daily. 135 tablet 3  . nitroGLYCERIN (NITROSTAT) 0.4 MG SL tablet Place 1 tablet (0.4 mg total) under the tongue every 5 (five) minutes as needed for chest pain. 90 tablet 3   No current facility-administered medications for this encounter.     No Known Allergies  Social History   Socioeconomic History  . Marital status: Married    Spouse name: Not on file  . Number of children: 1  . Years of education: Not on file  . Highest education level: Not on file  Social Needs  . Financial resource strain: Not on file  . Food insecurity - worry: Not on file  . Food insecurity - inability: Not on file  . Transportation needs - medical: Not on file  . Transportation needs - non-medical: Not on file  Occupational History  . Not on file  Tobacco Use  . Smoking status: Former Smoker    Types: Cigarettes  Last attempt to quit: 05/18/1969    Years since quitting: 48.4  . Smokeless tobacco: Never Used  Substance and Sexual Activity  . Alcohol use: No  . Drug use: No  . Sexual activity: Not on file  Other Topics Concern  . Not on file  Social History Narrative   Lives in [place] with her spouse.  Retired    Family History  Problem Relation Age of Onset  . Breast cancer Mother   . Lung cancer Father     ROS- All systems are reviewed and negative except as per the HPI above  Physical Exam: Vitals:   10/29/17 1015  BP: (!) 142/94  Pulse: 64  Weight: 120 lb (54.4 kg)  Height: 5\' 2"  (1.575 m)   Wt Readings from Last 3 Encounters:  10/29/17 120 lb (54.4 kg)    10/22/17 127 lb 9.6 oz (57.9 kg)  10/16/17 123 lb (55.8 kg)    Labs: Lab Results  Component Value Date   NA 135 10/22/2017   K 4.7 10/22/2017   CL 101 10/22/2017   CO2 27 10/22/2017   GLUCOSE 91 10/22/2017   BUN 26 (H) 10/22/2017   CREATININE 1.23 (H) 10/22/2017   CALCIUM 9.3 10/22/2017   No results found for: INR No results found for: CHOL, HDL, LDLCALC, TRIG   GEN- The patient is well appearing, alert and oriented x 3 today.   Head- normocephalic, atraumatic Eyes-  Sclera clear, conjunctiva pink Ears- hearing intact Oropharynx- clear Neck- supple, no JVP Lymph- no cervical lymphadenopathy Lungs- Clear to ausculation bilaterally, normal work of breathing Heart  paced, no murmurs, rubs or gallops, PMI not laterally displaced GI- soft, NT, ND, + BS Extremities- no clubbing, cyanosis, or edema MS- no significant deformity or atrophy Skin- no rash or lesion Psych- euthymic mood, full affect Neuro- strength and sensation are intact  EKG- AV dual-paced pr int 182 ms, qrs int 164 ms, qtc 546 ms EPIC records reviewed    Assessment and Plan: 1. Afib with rvr/tachy/brady syndrome Has resumed SR Is currently on flecainide 50 mg bid as flecainide level was elevated at 1.27 ug/ml on 100 mg bid and pt was symptomatic Flecainide  Level today Continue Eliquis 2.5 mg bid for a chadsvasc score of at least 5   2. Weight gain/LLE Can stop lasix as LLE is improved and weight has returned to normal bmet today  F/u with Dr. [name] as scheduled 12/31  Geroge Baseman. Aliana Kreischer, [facility] [address] [place], [place] [address] [phone]

## 2017-10-30 LAB — FLECAINIDE LEVEL: Flecainide: 0.76 ug/mL (ref 0.20–1.00)

## 2017-11-21 DIAGNOSIS — H02834 Dermatochalasis of left upper eyelid: Secondary | ICD-10-CM | POA: Diagnosis not present

## 2017-11-21 DIAGNOSIS — H40053 Ocular hypertension, bilateral: Secondary | ICD-10-CM | POA: Diagnosis not present

## 2017-11-21 DIAGNOSIS — H02831 Dermatochalasis of right upper eyelid: Secondary | ICD-10-CM | POA: Diagnosis not present

## 2017-11-21 DIAGNOSIS — H04123 Dry eye syndrome of bilateral lacrimal glands: Secondary | ICD-10-CM | POA: Diagnosis not present

## 2017-11-21 DIAGNOSIS — H25813 Combined forms of age-related cataract, bilateral: Secondary | ICD-10-CM | POA: Diagnosis not present

## 2017-11-22 ENCOUNTER — Other Ambulatory Visit: Payer: Self-pay | Admitting: *Deleted

## 2017-11-22 LAB — IFOBT (OCCULT BLOOD): IFOBT: NEGATIVE

## 2017-11-22 MED ORDER — METOPROLOL SUCCINATE ER 25 MG PO TB24: 25.0000 mg | ORAL_TABLET | Freq: Two times a day (BID) | ORAL | 2 refills | Status: DC

## 2017-12-16 ENCOUNTER — Encounter: Payer: Self-pay | Admitting: Internal Medicine

## 2017-12-16 ENCOUNTER — Other Ambulatory Visit: Payer: Self-pay | Admitting: Cardiology

## 2017-12-16 ENCOUNTER — Ambulatory Visit: Payer: PPO | Admitting: Internal Medicine

## 2017-12-16 VITALS — BP 160/90 | HR 62 | Ht 62.0 in | Wt 115.2 lb

## 2017-12-16 DIAGNOSIS — I481 Persistent atrial fibrillation: Secondary | ICD-10-CM

## 2017-12-16 DIAGNOSIS — I480 Paroxysmal atrial fibrillation: Secondary | ICD-10-CM | POA: Diagnosis not present

## 2017-12-16 DIAGNOSIS — I442 Atrioventricular block, complete: Secondary | ICD-10-CM | POA: Diagnosis not present

## 2017-12-16 DIAGNOSIS — I4819 Other persistent atrial fibrillation: Secondary | ICD-10-CM

## 2017-12-16 DIAGNOSIS — I495 Sick sinus syndrome: Secondary | ICD-10-CM

## 2017-12-16 LAB — CUP PACEART INCLINIC DEVICE CHECK
Battery Remaining Longevity: 61 mo
Battery Voltage: 2.98 V
Brady Statistic AP VP Percent: 96.31 %
Brady Statistic AP VS Percent: 0 %
Brady Statistic AS VP Percent: 3.67 %
Brady Statistic AS VS Percent: 0.02 %
Brady Statistic RA Percent Paced: 96.33 %
Brady Statistic RV Percent Paced: 99.98 %
Date Time Interrogation Session: 20181231150531
Implantable Lead Implant Date: 20180621
Implantable Lead Implant Date: 20180621
Implantable Lead Location: 753859
Implantable Lead Location: 753860
Implantable Lead Model: 3830
Implantable Lead Model: 5076
Implantable Pulse Generator Implant Date: 20180621
Lead Channel Impedance Value: 285 Ohm
Lead Channel Impedance Value: 323 Ohm
Lead Channel Impedance Value: 399 Ohm
Lead Channel Impedance Value: 456 Ohm
Lead Channel Pacing Threshold Amplitude: 1.25 V
Lead Channel Pacing Threshold Amplitude: 3.5 V
Lead Channel Pacing Threshold Pulse Width: 0.4 ms
Lead Channel Pacing Threshold Pulse Width: 1 ms
Lead Channel Sensing Intrinsic Amplitude: 1.375 mV
Lead Channel Sensing Intrinsic Amplitude: 1.375 mV
Lead Channel Sensing Intrinsic Amplitude: 2.125 mV
Lead Channel Sensing Intrinsic Amplitude: 2.875 mV
Lead Channel Setting Pacing Amplitude: 2 V
Lead Channel Setting Pacing Amplitude: 5 V
Lead Channel Setting Pacing Pulse Width: 1 ms
Lead Channel Setting Sensing Sensitivity: 0.9 mV

## 2017-12-16 MED ORDER — FLECAINIDE ACETATE 50 MG PO TABS: 25.0000 mg | ORAL_TABLET | Freq: Two times a day (BID) | ORAL | 3 refills | Status: DC

## 2017-12-16 NOTE — Progress Notes (Signed)
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  Since her PPM implant, the patient reports doing reasonably well. Her afib is currently controlled.  She is "weak" at times.  + rare palpitations though device interrogation dose not show recent afib. Today, she denies symptoms of chest pain, shortness of breath,  lower extremity edema, dizziness, presyncope, or syncope.  The patient is otherwise without complaint today.   Past Medical History:  Diagnosis Date  . Arthritis    RA IN HANDS  . Chronic congestive heart failure with left ventricular diastolic dysfunction ([place])   . Dyspnea   . Dysrhythmia   . Hypertension   . Hypoglycemia   . Nephrolithiasis   . Osteoporosis   . Paroxysmal SVT (supraventricular tachycardia) ([place]) 2014  . Persistent atrial fibrillation ([place])   . Thyroid disease   . Typical atrial flutter [facility])    Past Surgical History:  Procedure Laterality Date  . APPENDECTOMY    . CARDIOVERSION N/A 05/14/2017   Procedure: CARDIOVERSION;  Surgeon: [name], MD;  Location: [facility];  Service: Cardiovascular;  Laterality: N/A;  . PACEMAKER IMPLANT N/A 06/06/2017   Procedure: Pacemaker Implant;  Surgeon: [name], MD;  Location: [facility];  Service: Cardiovascular;  Laterality: N/A;    ROS- all systems are reviewed and negative except as per HPI above  Current Outpatient Medications  Medication Sig Dispense Refill  . ELIQUIS 2.5 MG TABS tablet TAKE 1 TABLET BY MOUTH TWICE DAILY 60 tablet 6  . flecainide (TAMBOCOR) 50 MG tablet Take 1 tablet (50 mg total) by mouth 2 (two) times daily. 60 tablet 3  . furosemide (LASIX) 20 MG tablet Take 1 tablet (20 mg total) daily by mouth. May take extra tablet in the afternoon for weight gain 36 tablet 2  . levothyroxine (SYNTHROID, LEVOTHROID) 75 MCG tablet Take 75 mcg by mouth daily before breakfast.    .  lisinopril (PRINIVIL,ZESTRIL) 5 MG tablet Take 5 mg by mouth daily.    . metoprolol succinate (TOPROL XL) 25 MG 24 hr tablet Take 1 tablet (25 mg total) by mouth 2 (two) times daily. 180 tablet 2  . nitroGLYCERIN (NITROSTAT) 0.4 MG SL tablet Place 1 tablet (0.4 mg total) under the tongue every 5 (five) minutes as needed for chest pain. 90 tablet 3   No current facility-administered medications for this visit.     Physical Exam: Vitals:   12/16/17 1043  BP: (!) 160/90  Pulse: 62  Weight: 115 lb 3.2 oz (52.3 kg)  Height: 5\' 2"  (1.575 m)    GEN- The patient is elderly appearing, alert and oriented x 3 today.   Head- normocephalic, atraumatic Eyes-  Sclera clear, conjunctiva pink Ears- hearing intact Oropharynx- clear Lungs- Clear to ausculation bilaterally, normal work of breathing Chest- pacemaker pocket is well healed Heart- Regular rate and rhythm (paced) GI- soft, NT, ND, + BS Extremities- no clubbing, cyanosis, or edema  Pacemaker interrogation- reviewed in detail today,  See PACEART report  ekg tracing ordered today is personally reviewed and shows A V pacing 65 bpm, qrs 122 msec  Assessment and Plan:  1. Symptomatic  complete heart block Pacemaker today reveals that she has progressed to complete heart block and is currently device dependant.  She has a His pacing lead.  V threshold is 3.25V @ 1 msec unipolar.  Higher bipolar.  I have  left her unipolar V pacing at 5V @  1 msec. Normal pacemaker function See Pace Art report No changes today  2. afib Well controlled She is not feeling well.  She had elevated flecainide level with 100mg  BID previously.  She has maintained sinus with 50mg  BID   I am going to reduce flecainide to 25mg  BID today given her elevated pacing threshold and overall weakness.  Continue on eliquis Recent labs from PCP reviewed  3. HTN Elevated today She reports BP mostly < 150/90 at home No changes today  4. CRI Creatinine 1.7 by labs from  primary care She says that Dr Dagmar Hait has advised repeat bmet by him in early January and is following closely   Return to see EP NP in 3 months [facility]  [name] MD, [facility] 12/16/2017 10:48 AM

## 2017-12-16 NOTE — Patient Instructions (Signed)
Medication Instructions:  Your physician has recommended you make the following change in your medication:  1) Decrease Flecainide to 25 mg twice daily   Labwork: None ordered   Testing/Procedures: None ordered   Follow-Up: Your physician recommends that you schedule a follow-up appointment in: 3 months with [name], NP  Remote monitoring is used to monitor your ICD from home. This monitoring reduces the number of office visits required to check your device to one time per year. It allows Brandi Chambers to keep an eye on the functioning of your device to ensure it is working properly. You are scheduled for a device check from home on 03/17/18. You may send your transmission at any time that day. If you have a wireless device, the transmission will be sent automatically. After your physician reviews your transmission, you will receive a postcard with your next transmission date.    Any Other Special Instructions Will Be Listed Below (If Applicable).     If you need a refill on your cardiac medications before your next appointment, please call your pharmacy.

## 2017-12-25 DIAGNOSIS — I7300 Raynaud's syndrome without gangrene: Secondary | ICD-10-CM | POA: Diagnosis not present

## 2017-12-25 DIAGNOSIS — E038 Other specified hypothyroidism: Secondary | ICD-10-CM | POA: Diagnosis not present

## 2017-12-25 DIAGNOSIS — I442 Atrioventricular block, complete: Secondary | ICD-10-CM | POA: Diagnosis not present

## 2017-12-25 DIAGNOSIS — Z6822 Body mass index (BMI) 22.0-22.9, adult: Secondary | ICD-10-CM | POA: Diagnosis not present

## 2017-12-25 DIAGNOSIS — I4891 Unspecified atrial fibrillation: Secondary | ICD-10-CM | POA: Diagnosis not present

## 2017-12-25 DIAGNOSIS — R945 Abnormal results of liver function studies: Secondary | ICD-10-CM | POA: Diagnosis not present

## 2017-12-25 DIAGNOSIS — I10 Essential (primary) hypertension: Secondary | ICD-10-CM | POA: Diagnosis not present

## 2017-12-25 DIAGNOSIS — Z1389 Encounter for screening for other disorder: Secondary | ICD-10-CM | POA: Diagnosis not present

## 2017-12-31 ENCOUNTER — Other Ambulatory Visit: Payer: Self-pay | Admitting: Internal Medicine

## 2017-12-31 DIAGNOSIS — N289 Disorder of kidney and ureter, unspecified: Secondary | ICD-10-CM

## 2018-01-06 ENCOUNTER — Ambulatory Visit
Admission: RE | Admit: 2018-01-06 | Discharge: 2018-01-06 | Disposition: A | Discharge status: Home / Self Care | Payer: PPO | Source: Ambulatory Visit | Attending: Internal Medicine | Admitting: Internal Medicine

## 2018-01-06 DIAGNOSIS — N289 Disorder of kidney and ureter, unspecified: Secondary | ICD-10-CM

## 2018-01-06 DIAGNOSIS — R944 Abnormal results of kidney function studies: Secondary | ICD-10-CM | POA: Diagnosis not present

## 2018-01-31 DIAGNOSIS — D631 Anemia in chronic kidney disease: Secondary | ICD-10-CM | POA: Diagnosis not present

## 2018-01-31 DIAGNOSIS — Z950 Presence of cardiac pacemaker: Secondary | ICD-10-CM | POA: Diagnosis not present

## 2018-01-31 DIAGNOSIS — I4891 Unspecified atrial fibrillation: Secondary | ICD-10-CM | POA: Diagnosis not present

## 2018-01-31 DIAGNOSIS — M810 Age-related osteoporosis without current pathological fracture: Secondary | ICD-10-CM | POA: Diagnosis not present

## 2018-01-31 DIAGNOSIS — I509 Heart failure, unspecified: Secondary | ICD-10-CM | POA: Diagnosis not present

## 2018-01-31 DIAGNOSIS — N2581 Secondary hyperparathyroidism of renal origin: Secondary | ICD-10-CM | POA: Diagnosis not present

## 2018-01-31 DIAGNOSIS — Z8679 Personal history of other diseases of the circulatory system: Secondary | ICD-10-CM | POA: Diagnosis not present

## 2018-01-31 DIAGNOSIS — N183 Chronic kidney disease, stage 3 (moderate): Secondary | ICD-10-CM | POA: Diagnosis not present

## 2018-01-31 DIAGNOSIS — E039 Hypothyroidism, unspecified: Secondary | ICD-10-CM | POA: Diagnosis not present

## 2018-01-31 DIAGNOSIS — I701 Atherosclerosis of renal artery: Secondary | ICD-10-CM | POA: Diagnosis not present

## 2018-01-31 DIAGNOSIS — I129 Hypertensive chronic kidney disease with stage 1 through stage 4 chronic kidney disease, or unspecified chronic kidney disease: Secondary | ICD-10-CM | POA: Diagnosis not present

## 2018-01-31 DIAGNOSIS — E785 Hyperlipidemia, unspecified: Secondary | ICD-10-CM | POA: Diagnosis not present

## 2018-02-04 DIAGNOSIS — N183 Chronic kidney disease, stage 3 (moderate): Secondary | ICD-10-CM | POA: Diagnosis not present

## 2018-02-20 ENCOUNTER — Other Ambulatory Visit (HOSPITAL_COMMUNITY): Payer: Self-pay | Admitting: Nurse Practitioner

## 2018-03-05 NOTE — Progress Notes (Signed)
Electrophysiology Office Note Date: 03/06/2018  ID:  Brandi Chambers, DOB 02-11-1932, MRN [id number]  PCP: [name], MD Electrophysiologist: [name]  CC: AF follow up  Brandi Chambers is a 84 y.o. female seen today for Dr [name].  She presents today for routine electrophysiology followup.  Since last being seen in our clinic, the patient reports doing relatively well.  Her Flecainide was decreased 2/2 high RV thresholds maintenance of SR.  She denies chest pain, nausea, vomiting, dizziness, syncope, edema, weight gain, or early satiety.  Device History: MDT dual chamber PPM (His Bundle) implanted 2018 for tachy/brady   Past Medical History:  Diagnosis Date  . Arthritis    RA IN HANDS  . Chronic congestive heart failure with left ventricular diastolic dysfunction ([place])   . Complete heart block ([place])   . Hypertension   . Hypoglycemia   . Nephrolithiasis   . Osteoporosis   . Persistent atrial fibrillation ([place])   . Thyroid disease   . Typical atrial flutter [facility])    Past Surgical History:  Procedure Laterality Date  . APPENDECTOMY    . CARDIOVERSION N/A 05/14/2017   Procedure: CARDIOVERSION;  Surgeon: [name], MD;  Location: [place];  Service: Cardiovascular;  Laterality: N/A;  . PACEMAKER IMPLANT N/A 06/06/2017   Procedure: Pacemaker Implant;  Surgeon: [name], MD;  Location: [place] [facility];  Service: Cardiovascular;  Laterality: N/A;    Current Outpatient Medications  Medication Sig Dispense Refill  . amLODipine (NORVASC) 2.5 MG tablet Take 1 tablet by mouth daily.  3  . ELIQUIS 2.5 MG TABS tablet TAKE 1 TABLET BY MOUTH TWICE DAILY 60 tablet 6  . flecainide (TAMBOCOR) 50 MG tablet Take 0.5 tablets (25 mg total) by mouth 2 (two) times daily. 60 tablet 3  . flecainide (TAMBOCOR) 50 MG tablet Take 50 mg by mouth daily.    . furosemide (LASIX) 20 MG tablet Take 1 tablet (20 mg total) daily by mouth. May take extra tablet in the afternoon for  weight gain 36 tablet 2  . levothyroxine (SYNTHROID, LEVOTHROID) 75 MCG tablet Take 75 mcg by mouth daily before breakfast.    . metoprolol succinate (TOPROL XL) 25 MG 24 hr tablet Take 1 tablet (25 mg total) by mouth 2 (two) times daily. 180 tablet 2  . nitroGLYCERIN (NITROSTAT) 0.4 MG SL tablet Place 1 tablet (0.4 mg total) under the tongue every 5 (five) minutes as needed for chest pain. 90 tablet 3   No current facility-administered medications for this visit.     Allergies:   Patient has no known allergies.   Social History: Social History   Socioeconomic History  . Marital status: Married    Spouse name: Not on file  . Number of children: 1  . Years of education: Not on file  . Highest education level: Not on file  Occupational History  . Not on file  Social Needs  . Financial resource strain: Not on file  . Food insecurity:    Worry: Not on file    Inability: Not on file  . Transportation needs:    Medical: Not on file    Non-medical: Not on file  Tobacco Use  . Smoking status: Former Smoker    Types: Cigarettes    Last attempt to quit: 05/18/1969    Years since quitting: 48.8  . Smokeless tobacco: Never Used  Substance and Sexual Activity  . Alcohol use: No  . Drug use: No  . Sexual  activity: Not on file  Lifestyle  . Physical activity:    Days per week: Not on file    Minutes per session: Not on file  . Stress: Not on file  Relationships  . Social connections:    Talks on phone: Not on file    Gets together: Not on file    Attends religious service: Not on file    Active member of club or organization: Not on file    Attends meetings of clubs or organizations: Not on file    Relationship status: Not on file  . Intimate partner violence:    Fear of current or ex partner: Not on file    Emotionally abused: Not on file    Physically abused: Not on file    Forced sexual activity: Not on file  Other Topics Concern  . Not on file  Social History Narrative     Lives in [place] with her spouse.  Retired    Family History: Family History  Problem Relation Age of Onset  . Breast cancer Mother   . Lung cancer Father      Review of Systems: All other systems reviewed and are otherwise negative except as noted above.   Physical Exam: VS:  BP (!) 142/84   Pulse 77   Ht 5\' 2"  (1.575 m)   Wt 122 lb 12.8 oz (55.7 kg)   LMP  (LMP Unknown)   SpO2 96%   BMI 22.46 kg/m  , BMI Body mass index is 22.46 kg/m.  GEN- The patient is elderly appearing, alert and oriented x 3 today.   HEENT: normocephalic, atraumatic; sclera clear, conjunctiva pink; hearing intact; oropharynx clear; neck supple Lungs- Clear to ausculation bilaterally, normal work of breathing.  No wheezes, rales, rhonchi Heart- Regular rate and rhythm (paced) GI- soft, non-tender, non-distended, bowel sounds present Extremities- no clubbing, cyanosis, or edema MS- no significant deformity or atrophy Skin- warm and dry, no rash or lesion; PPM pocket well healed Psych- euthymic mood, full affect Neuro- strength and sensation are intact  PPM Interrogation- reviewed in detail today,  See PACEART report  EKG:  EKG is not ordered today.  Recent Labs: 04/16/2017: ALT 25; TSH 0.353 06/06/2017: Hemoglobin 13.0; Platelets 241 10/29/2017: BUN 35; Creatinine, Ser 1.84; Potassium 4.5; Sodium 133   Wt Readings from Last 3 Encounters:  03/06/18 122 lb 12.8 oz (55.7 kg)  12/16/17 115 lb 3.2 oz (52.3 kg)  10/29/17 120 lb (54.4 kg)     Other studies Reviewed: Additional studies/ records that were reviewed today include: hospital records, AF clinic notes  Assessment and Plan:  1.  Symptomatic bradycardia -> complete heart block  Normal PPM function See PaceArt report No changes His Bundle threshold stable today at 3.5V  2.  Persistent atrial fibrillation/tachy/brady Burden by device interrogation 0% Continue low dose Flecainide    3.  HTN Stable No change required today  4.   Chronic diastolic heart failure Euvolemic on exam Continue current therapy   Current medicines are reviewed at length with the patient today.   The patient does not have concerns regarding her medicines.  The following changes were made today: none  Labs/ tests ordered today include: none No orders of the defined types were placed in this encounter.    Disposition:   Follow up with me in 6 months, [facility]    Signed, [name], NP 03/06/2018 10:39 AM  [place] [address] [phone] (office) (  [phone] (fax)

## 2018-03-06 ENCOUNTER — Encounter: Payer: Self-pay | Admitting: Nurse Practitioner

## 2018-03-06 ENCOUNTER — Ambulatory Visit: Payer: PPO | Admitting: Nurse Practitioner

## 2018-03-06 VITALS — BP 142/84 | HR 77 | Ht 62.0 in | Wt 122.8 lb

## 2018-03-06 DIAGNOSIS — I442 Atrioventricular block, complete: Secondary | ICD-10-CM | POA: Diagnosis not present

## 2018-03-06 DIAGNOSIS — I10 Essential (primary) hypertension: Secondary | ICD-10-CM

## 2018-03-06 DIAGNOSIS — I4819 Other persistent atrial fibrillation: Secondary | ICD-10-CM

## 2018-03-06 DIAGNOSIS — I481 Persistent atrial fibrillation: Secondary | ICD-10-CM

## 2018-03-06 DIAGNOSIS — I5032 Chronic diastolic (congestive) heart failure: Secondary | ICD-10-CM | POA: Diagnosis not present

## 2018-03-06 NOTE — Patient Instructions (Addendum)
Medication Instructions:   Your physician recommends that you continue on your current medications as directed. Please refer to the Current Medication list given to you today.    If you need a refill on your cardiac medications before your next appointment, please call your pharmacy.  Labwork: NONE ORDERED  TODAY    Testing/Procedures: NONE ORDERED  TODAY    Follow-Up:  Your physician wants you to follow-up in:  IN  [place] will receive a reminder letter in the mail two months in advance. If you don't receive a letter, please call our office to schedule the follow-up appointment.  Remote monitoring is used to monitor your Pacemaker of ICD from home. This monitoring reduces the number of office visits required to check your device to one time per year. It allows [place] to keep an eye on the functioning of your device to ensure it is working properly. You are scheduled for a device check from home on . 03-17-18 You may send your transmission at any time that day. If you have a wireless device, the transmission will be sent automatically. After your physician reviews your transmission, you will receive a postcard with your next transmission date.     Any Other Special Instructions Will Be Listed Below (If Applicable).

## 2018-03-10 LAB — CUP PACEART INCLINIC DEVICE CHECK
Date Time Interrogation Session: 20190325102445
Implantable Lead Implant Date: 20180621
Implantable Lead Implant Date: 20180621
Implantable Lead Location: 753859
Implantable Lead Location: 753860
Implantable Lead Model: 3830
Implantable Lead Model: 5076
Implantable Pulse Generator Implant Date: 20180621

## 2018-03-11 DIAGNOSIS — I129 Hypertensive chronic kidney disease with stage 1 through stage 4 chronic kidney disease, or unspecified chronic kidney disease: Secondary | ICD-10-CM | POA: Diagnosis not present

## 2018-03-11 DIAGNOSIS — K589 Irritable bowel syndrome without diarrhea: Secondary | ICD-10-CM | POA: Diagnosis not present

## 2018-03-11 DIAGNOSIS — N2581 Secondary hyperparathyroidism of renal origin: Secondary | ICD-10-CM | POA: Diagnosis not present

## 2018-03-11 DIAGNOSIS — E039 Hypothyroidism, unspecified: Secondary | ICD-10-CM | POA: Diagnosis not present

## 2018-03-11 DIAGNOSIS — N183 Chronic kidney disease, stage 3 (moderate): Secondary | ICD-10-CM | POA: Diagnosis not present

## 2018-03-11 DIAGNOSIS — Z950 Presence of cardiac pacemaker: Secondary | ICD-10-CM | POA: Diagnosis not present

## 2018-03-11 DIAGNOSIS — I701 Atherosclerosis of renal artery: Secondary | ICD-10-CM | POA: Diagnosis not present

## 2018-03-11 DIAGNOSIS — M810 Age-related osteoporosis without current pathological fracture: Secondary | ICD-10-CM | POA: Diagnosis not present

## 2018-03-11 DIAGNOSIS — D631 Anemia in chronic kidney disease: Secondary | ICD-10-CM | POA: Diagnosis not present

## 2018-03-11 DIAGNOSIS — E785 Hyperlipidemia, unspecified: Secondary | ICD-10-CM | POA: Diagnosis not present

## 2018-03-11 DIAGNOSIS — I509 Heart failure, unspecified: Secondary | ICD-10-CM | POA: Diagnosis not present

## 2018-03-11 DIAGNOSIS — I4891 Unspecified atrial fibrillation: Secondary | ICD-10-CM | POA: Diagnosis not present

## 2018-03-12 ENCOUNTER — Other Ambulatory Visit: Payer: Self-pay | Admitting: Nurse Practitioner

## 2018-03-12 MED ORDER — FLECAINIDE ACETATE 50 MG PO TABS: 50.0000 mg | ORAL_TABLET | Freq: Every day | ORAL | 3 refills | Status: DC

## 2018-03-17 ENCOUNTER — Ambulatory Visit (INDEPENDENT_AMBULATORY_CARE_PROVIDER_SITE_OTHER): Payer: PPO | Admitting: *Deleted

## 2018-03-17 DIAGNOSIS — I442 Atrioventricular block, complete: Secondary | ICD-10-CM

## 2018-03-17 LAB — CUP PACEART REMOTE DEVICE CHECK
Battery Remaining Longevity: 48 mo
Battery Voltage: 2.95 V
Brady Statistic AP VP Percent: 99.66 %
Brady Statistic AP VS Percent: 0 %
Brady Statistic AS VP Percent: 0.33 %
Brady Statistic AS VS Percent: 0 %
Brady Statistic RA Percent Paced: 99.66 %
Brady Statistic RV Percent Paced: 100 %
Date Time Interrogation Session: 20190401044643
Implantable Lead Implant Date: 20180621
Implantable Lead Implant Date: 20180621
Implantable Lead Location: 753859
Implantable Lead Location: 753860
Implantable Lead Model: 3830
Implantable Lead Model: 5076
Implantable Pulse Generator Implant Date: 20180621
Lead Channel Impedance Value: 285 Ohm
Lead Channel Impedance Value: 323 Ohm
Lead Channel Impedance Value: 380 Ohm
Lead Channel Impedance Value: 456 Ohm
Lead Channel Pacing Threshold Amplitude: 1 V
Lead Channel Pacing Threshold Pulse Width: 0.4 ms
Lead Channel Sensing Intrinsic Amplitude: 2.125 mV
Lead Channel Sensing Intrinsic Amplitude: 2.125 mV
Lead Channel Sensing Intrinsic Amplitude: 2.5 mV
Lead Channel Sensing Intrinsic Amplitude: 2.5 mV
Lead Channel Setting Pacing Amplitude: 1.5 V
Lead Channel Setting Pacing Amplitude: 5 V
Lead Channel Setting Pacing Pulse Width: 1 ms
Lead Channel Setting Sensing Sensitivity: 0.9 mV

## 2018-03-17 NOTE — Progress Notes (Signed)
Remote pacemaker transmission.   

## 2018-03-18 ENCOUNTER — Encounter: Payer: Self-pay | Admitting: Cardiology

## 2018-03-19 DIAGNOSIS — L97909 Non-pressure chronic ulcer of unspecified part of unspecified lower leg with unspecified severity: Secondary | ICD-10-CM | POA: Diagnosis not present

## 2018-03-19 DIAGNOSIS — I87319 Chronic venous hypertension (idiopathic) with ulcer of unspecified lower extremity: Secondary | ICD-10-CM | POA: Diagnosis not present

## 2018-03-19 DIAGNOSIS — Z6822 Body mass index (BMI) 22.0-22.9, adult: Secondary | ICD-10-CM | POA: Diagnosis not present

## 2018-03-19 DIAGNOSIS — L03116 Cellulitis of left lower limb: Secondary | ICD-10-CM | POA: Diagnosis not present

## 2018-03-19 DIAGNOSIS — D0439 Carcinoma in situ of skin of other parts of face: Secondary | ICD-10-CM | POA: Diagnosis not present

## 2018-03-19 DIAGNOSIS — I10 Essential (primary) hypertension: Secondary | ICD-10-CM | POA: Diagnosis not present

## 2018-03-19 DIAGNOSIS — D485 Neoplasm of uncertain behavior of skin: Secondary | ICD-10-CM | POA: Diagnosis not present

## 2018-03-19 DIAGNOSIS — L080 Pyoderma: Secondary | ICD-10-CM | POA: Diagnosis not present

## 2018-03-26 DIAGNOSIS — Z6822 Body mass index (BMI) 22.0-22.9, adult: Secondary | ICD-10-CM | POA: Diagnosis not present

## 2018-03-26 DIAGNOSIS — L97909 Non-pressure chronic ulcer of unspecified part of unspecified lower leg with unspecified severity: Secondary | ICD-10-CM | POA: Diagnosis not present

## 2018-03-26 DIAGNOSIS — I87319 Chronic venous hypertension (idiopathic) with ulcer of unspecified lower extremity: Secondary | ICD-10-CM | POA: Diagnosis not present

## 2018-03-26 DIAGNOSIS — D0439 Carcinoma in situ of skin of other parts of face: Secondary | ICD-10-CM | POA: Diagnosis not present

## 2018-04-01 DIAGNOSIS — N183 Chronic kidney disease, stage 3 (moderate): Secondary | ICD-10-CM | POA: Diagnosis not present

## 2018-04-01 DIAGNOSIS — I129 Hypertensive chronic kidney disease with stage 1 through stage 4 chronic kidney disease, or unspecified chronic kidney disease: Secondary | ICD-10-CM | POA: Diagnosis not present

## 2018-04-02 DIAGNOSIS — I87319 Chronic venous hypertension (idiopathic) with ulcer of unspecified lower extremity: Secondary | ICD-10-CM | POA: Diagnosis not present

## 2018-04-02 DIAGNOSIS — L97909 Non-pressure chronic ulcer of unspecified part of unspecified lower leg with unspecified severity: Secondary | ICD-10-CM | POA: Diagnosis not present

## 2018-04-02 DIAGNOSIS — Z4800 Encounter for change or removal of nonsurgical wound dressing: Secondary | ICD-10-CM | POA: Diagnosis not present

## 2018-04-02 DIAGNOSIS — Z6822 Body mass index (BMI) 22.0-22.9, adult: Secondary | ICD-10-CM | POA: Diagnosis not present

## 2018-04-09 DIAGNOSIS — Z4800 Encounter for change or removal of nonsurgical wound dressing: Secondary | ICD-10-CM | POA: Diagnosis not present

## 2018-04-09 DIAGNOSIS — I87319 Chronic venous hypertension (idiopathic) with ulcer of unspecified lower extremity: Secondary | ICD-10-CM | POA: Diagnosis not present

## 2018-04-09 DIAGNOSIS — Z6822 Body mass index (BMI) 22.0-22.9, adult: Secondary | ICD-10-CM | POA: Diagnosis not present

## 2018-04-14 DIAGNOSIS — I87312 Chronic venous hypertension (idiopathic) with ulcer of left lower extremity: Secondary | ICD-10-CM | POA: Diagnosis not present

## 2018-04-14 DIAGNOSIS — Z4800 Encounter for change or removal of nonsurgical wound dressing: Secondary | ICD-10-CM | POA: Diagnosis not present

## 2018-04-21 DIAGNOSIS — Z4800 Encounter for change or removal of nonsurgical wound dressing: Secondary | ICD-10-CM | POA: Diagnosis not present

## 2018-04-21 DIAGNOSIS — I701 Atherosclerosis of renal artery: Secondary | ICD-10-CM | POA: Diagnosis not present

## 2018-04-21 DIAGNOSIS — I87319 Chronic venous hypertension (idiopathic) with ulcer of unspecified lower extremity: Secondary | ICD-10-CM | POA: Diagnosis not present

## 2018-04-21 DIAGNOSIS — I10 Essential (primary) hypertension: Secondary | ICD-10-CM | POA: Diagnosis not present

## 2018-04-21 DIAGNOSIS — N183 Chronic kidney disease, stage 3 (moderate): Secondary | ICD-10-CM | POA: Diagnosis not present

## 2018-04-21 DIAGNOSIS — Z6822 Body mass index (BMI) 22.0-22.9, adult: Secondary | ICD-10-CM | POA: Diagnosis not present

## 2018-04-30 DIAGNOSIS — I87319 Chronic venous hypertension (idiopathic) with ulcer of unspecified lower extremity: Secondary | ICD-10-CM | POA: Diagnosis not present

## 2018-04-30 DIAGNOSIS — Z6822 Body mass index (BMI) 22.0-22.9, adult: Secondary | ICD-10-CM | POA: Diagnosis not present

## 2018-04-30 DIAGNOSIS — Z4800 Encounter for change or removal of nonsurgical wound dressing: Secondary | ICD-10-CM | POA: Diagnosis not present

## 2018-06-02 DIAGNOSIS — Z6822 Body mass index (BMI) 22.0-22.9, adult: Secondary | ICD-10-CM | POA: Diagnosis not present

## 2018-06-02 DIAGNOSIS — I10 Essential (primary) hypertension: Secondary | ICD-10-CM | POA: Diagnosis not present

## 2018-06-02 DIAGNOSIS — I87319 Chronic venous hypertension (idiopathic) with ulcer of unspecified lower extremity: Secondary | ICD-10-CM | POA: Diagnosis not present

## 2018-06-02 DIAGNOSIS — I4891 Unspecified atrial fibrillation: Secondary | ICD-10-CM | POA: Diagnosis not present

## 2018-06-02 DIAGNOSIS — N183 Chronic kidney disease, stage 3 (moderate): Secondary | ICD-10-CM | POA: Diagnosis not present

## 2018-06-02 DIAGNOSIS — I5032 Chronic diastolic (congestive) heart failure: Secondary | ICD-10-CM | POA: Diagnosis not present

## 2018-06-02 DIAGNOSIS — I701 Atherosclerosis of renal artery: Secondary | ICD-10-CM | POA: Diagnosis not present

## 2018-06-02 DIAGNOSIS — E038 Other specified hypothyroidism: Secondary | ICD-10-CM | POA: Diagnosis not present

## 2018-06-09 DIAGNOSIS — E039 Hypothyroidism, unspecified: Secondary | ICD-10-CM | POA: Diagnosis not present

## 2018-06-09 DIAGNOSIS — E785 Hyperlipidemia, unspecified: Secondary | ICD-10-CM | POA: Diagnosis not present

## 2018-06-09 DIAGNOSIS — M810 Age-related osteoporosis without current pathological fracture: Secondary | ICD-10-CM | POA: Diagnosis not present

## 2018-06-09 DIAGNOSIS — I129 Hypertensive chronic kidney disease with stage 1 through stage 4 chronic kidney disease, or unspecified chronic kidney disease: Secondary | ICD-10-CM | POA: Diagnosis not present

## 2018-06-09 DIAGNOSIS — D631 Anemia in chronic kidney disease: Secondary | ICD-10-CM | POA: Diagnosis not present

## 2018-06-09 DIAGNOSIS — I701 Atherosclerosis of renal artery: Secondary | ICD-10-CM | POA: Diagnosis not present

## 2018-06-09 DIAGNOSIS — N2581 Secondary hyperparathyroidism of renal origin: Secondary | ICD-10-CM | POA: Diagnosis not present

## 2018-06-09 DIAGNOSIS — N183 Chronic kidney disease, stage 3 (moderate): Secondary | ICD-10-CM | POA: Diagnosis not present

## 2018-06-09 DIAGNOSIS — Z950 Presence of cardiac pacemaker: Secondary | ICD-10-CM | POA: Diagnosis not present

## 2018-06-09 DIAGNOSIS — I4891 Unspecified atrial fibrillation: Secondary | ICD-10-CM | POA: Diagnosis not present

## 2018-06-09 DIAGNOSIS — K589 Irritable bowel syndrome without diarrhea: Secondary | ICD-10-CM | POA: Diagnosis not present

## 2018-06-09 DIAGNOSIS — I509 Heart failure, unspecified: Secondary | ICD-10-CM | POA: Diagnosis not present

## 2018-06-12 ENCOUNTER — Telehealth: Payer: Self-pay | Admitting: Nurse Practitioner

## 2018-06-12 NOTE — Telephone Encounter (Signed)
New Message     1. Has your device fired? no  2. Is you device beeping? no  3. Are you experiencing draining or swelling at device site? no  4. Are you calling to see if we received your device transmission? No   5. Have you passed out? No    Patient is calling because she has been feeling erratic. She says her BP has been up and down and overall just not well. When I asked if her device is beeping or firing she said no, but it has been acting erratic too. Please call.     Please route to Brandi Chambers

## 2018-06-12 NOTE — Telephone Encounter (Signed)
Spoke with pt and informed her that transmission was received and that I did some AF pt stated that could of been it informed pt that if she continues to have these feelings then to call back, pt voiced understanding.

## 2018-06-12 NOTE — Telephone Encounter (Signed)
Spoke with pt, pt stated that she was feeling better now but that she would send in a transmission.

## 2018-06-16 ENCOUNTER — Ambulatory Visit (INDEPENDENT_AMBULATORY_CARE_PROVIDER_SITE_OTHER): Payer: PPO | Admitting: *Deleted

## 2018-06-16 DIAGNOSIS — I442 Atrioventricular block, complete: Secondary | ICD-10-CM | POA: Diagnosis not present

## 2018-06-16 NOTE — Progress Notes (Signed)
Remote pacemaker transmission.   

## 2018-06-21 LAB — CUP PACEART REMOTE DEVICE CHECK
Battery Remaining Longevity: 42 mo
Battery Voltage: 2.94 V
Brady Statistic AP VP Percent: 0 %
Brady Statistic AP VS Percent: 0 %
Brady Statistic AS VP Percent: 0 %
Brady Statistic AS VS Percent: 0 %
Brady Statistic RA Percent Paced: 0.65 %
Brady Statistic RV Percent Paced: 99.93 %
Date Time Interrogation Session: 20190701044530
Implantable Lead Implant Date: 20180621
Implantable Lead Implant Date: 20180621
Implantable Lead Location: 753859
Implantable Lead Location: 753860
Implantable Lead Model: 3830
Implantable Lead Model: 5076
Implantable Pulse Generator Implant Date: 20180621
Lead Channel Impedance Value: 285 Ohm
Lead Channel Impedance Value: 304 Ohm
Lead Channel Impedance Value: 399 Ohm
Lead Channel Impedance Value: 456 Ohm
Lead Channel Pacing Threshold Amplitude: 0.875 V
Lead Channel Pacing Threshold Pulse Width: 0.4 ms
Lead Channel Sensing Intrinsic Amplitude: 0.625 mV
Lead Channel Sensing Intrinsic Amplitude: 0.625 mV
Lead Channel Sensing Intrinsic Amplitude: 2.625 mV
Lead Channel Sensing Intrinsic Amplitude: 2.625 mV
Lead Channel Setting Pacing Amplitude: 1.5 V
Lead Channel Setting Pacing Amplitude: 5 V
Lead Channel Setting Pacing Pulse Width: 1 ms
Lead Channel Setting Sensing Sensitivity: 0.9 mV

## 2018-06-24 ENCOUNTER — Telehealth: Payer: Self-pay | Admitting: Nurse Practitioner

## 2018-06-24 NOTE — Telephone Encounter (Signed)
Called patient back about her message. Patient stated she feels weak and her A. Fib is getting worse. Patient denies chest pain or SOB. Patient stated she feels like she is going to pass out during these episodes of dizziness and weakness. Patient stated it happens if she is active or resting. Patient stated it's been going on for 3 to 5 weeks, and  getting worse. Informed patient that her BP and HR are fine. Patient has a Pacemaker. Will forward to Device clinic, since patient had recent remote check. Will send to Dr. [name] and his nurse for further advisement.

## 2018-06-24 NOTE — Telephone Encounter (Signed)
New Message   Patient c/o Palpitations:  High priority if patient c/o lightheadedness, shortness of breath, or chest pain  1) How long have you had palpitations/irregular HR/ Afib? Are you having the symptoms now? Afib for about a week  2) Are you currently experiencing lightheadedness, SOB or CP? Lightheadedness   3) Do you have a history of afib (atrial fibrillation) or irregular heart rhythm? yes  4) Have you checked your BP or HR? (document readings if available): this morning its was 5:00am bp:126/79 Hr: 87  8:30am bp:141/84 hr: 84  5) Are you experiencing any other symptoms? Weakness and dizziness

## 2018-06-24 NOTE — Telephone Encounter (Signed)
Talked with patient - feels like shes back to where she was before the pacemaker symptom wise. Appt made for 7/10. [name] agreement.

## 2018-06-24 NOTE — Telephone Encounter (Signed)
100% AF- persistent since April. On Eliquis and flecainide. Remote transmission 06/16/18- normal device function. HR histograms show V rates 60-110bpm with a normal distribution.

## 2018-06-25 ENCOUNTER — Encounter (HOSPITAL_COMMUNITY): Payer: Self-pay | Admitting: Nurse Practitioner

## 2018-06-25 ENCOUNTER — Ambulatory Visit (HOSPITAL_COMMUNITY)
Admission: RE | Admit: 2018-06-25 | Discharge: 2018-06-25 | Disposition: A | Discharge status: Home / Self Care | Payer: PPO | Source: Ambulatory Visit | Attending: Nurse Practitioner | Admitting: Nurse Practitioner

## 2018-06-25 VITALS — BP 140/84 | HR 69 | Ht 62.0 in | Wt 119.0 lb

## 2018-06-25 DIAGNOSIS — R609 Edema, unspecified: Secondary | ICD-10-CM | POA: Insufficient documentation

## 2018-06-25 DIAGNOSIS — Z87891 Personal history of nicotine dependence: Secondary | ICD-10-CM | POA: Insufficient documentation

## 2018-06-25 DIAGNOSIS — I495 Sick sinus syndrome: Secondary | ICD-10-CM | POA: Diagnosis not present

## 2018-06-25 DIAGNOSIS — Z950 Presence of cardiac pacemaker: Secondary | ICD-10-CM | POA: Diagnosis not present

## 2018-06-25 DIAGNOSIS — Z9889 Other specified postprocedural states: Secondary | ICD-10-CM | POA: Insufficient documentation

## 2018-06-25 DIAGNOSIS — I481 Persistent atrial fibrillation: Secondary | ICD-10-CM | POA: Diagnosis not present

## 2018-06-25 DIAGNOSIS — Z7901 Long term (current) use of anticoagulants: Secondary | ICD-10-CM | POA: Diagnosis not present

## 2018-06-25 DIAGNOSIS — I4819 Other persistent atrial fibrillation: Secondary | ICD-10-CM

## 2018-06-25 DIAGNOSIS — Z79890 Hormone replacement therapy (postmenopausal): Secondary | ICD-10-CM | POA: Diagnosis not present

## 2018-06-25 DIAGNOSIS — Z79899 Other long term (current) drug therapy: Secondary | ICD-10-CM | POA: Diagnosis not present

## 2018-06-25 MED ORDER — AMIODARONE HCL 200 MG PO TABS: ORAL_TABLET | ORAL | 0 refills | Status: DC

## 2018-06-25 NOTE — Patient Instructions (Signed)
Stop Flecainide   On Sunday -- Start Amiodarone 200mg  twice a day with food

## 2018-06-25 NOTE — Progress Notes (Signed)
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 female with a h/o tachy brady syndrome s/p PPM for CHB following amiodarone load. Pt then was placed on flecainide last November ,and stayed in [place], but is in the afib clinic today for persistent afib since the first of April, found on device check.. Pt states that she is feeling the fatigue and some shortness of breath like she felt in afib. Fluid status is stable.  Today, she denies symptoms of palpitations, chest pain,  orthopnea, PND, lower extremity edema, dizziness, presyncope, syncope, or neurologic sequela.+ some fatigue  and some shortness of breath with exertion. The patient is tolerating medications without difficulties and is otherwise without complaint today.   Past Medical History:  Diagnosis Date  . Arthritis    RA IN HANDS  . Chronic congestive heart failure with left ventricular diastolic dysfunction ([place])   . Complete heart block ([place])   . Hypertension   . Hypoglycemia   . Nephrolithiasis   . Osteoporosis   . Persistent atrial fibrillation ([place])   . Thyroid disease   . Typical atrial flutter [facility])    Past Surgical History:  Procedure Laterality Date  . APPENDECTOMY    . CARDIOVERSION N/A 05/14/2017   Procedure: CARDIOVERSION;  Surgeon: [name], MD;  Location: [facility];  Service: Cardiovascular;  Laterality: N/A;  . PACEMAKER IMPLANT N/A 06/06/2017   Procedure: Pacemaker Implant;  Surgeon: [name], MD;  Location: [place] [facility];  Service: Cardiovascular;  Laterality: N/A;    Current Outpatient Medications  Medication Sig Dispense Refill  . amLODipine (NORVASC) 2.5 MG tablet Take 1 tablet by mouth daily.  3  . ELIQUIS 2.5 MG TABS tablet TAKE 1 TABLET BY MOUTH TWICE DAILY 60 tablet 6  . furosemide (LASIX) 20 MG tablet Take 20 mg by mouth every other day.    . levothyroxine (SYNTHROID, LEVOTHROID) 75 MCG tablet Take 75 mcg by mouth daily  before breakfast.    . metoprolol succinate (TOPROL-XL) 25 MG 24 hr tablet Take 25 mg by mouth daily.    . nitroGLYCERIN (NITROSTAT) 0.4 MG SL tablet Place 1 tablet (0.4 mg total) under the tongue every 5 (five) minutes as needed for chest pain. 90 tablet 3  . amiodarone (PACERONE) 200 MG tablet Take 1 tablet twice a day for 1 month then reduce to 1 tablet daily 60 tablet 0   No current facility-administered medications for this encounter.     No Known Allergies  Social History   Socioeconomic History  . Marital status: Married    Spouse name: Not on file  . Number of children: 1  . Years of education: Not on file  . Highest education level: Not on file  Occupational History  . Not on file  Social Needs  . Financial resource strain: Not on file  . Food insecurity:    Worry: Not on file    Inability: Not on file  . Transportation needs:    Medical: Not on file    Non-medical: Not on file  Tobacco Use  . Smoking status: Former Smoker    Types: Cigarettes    Last attempt to quit: 05/18/1969    Years since quitting: 49.1  . Smokeless tobacco: Never Used  Substance and Sexual Activity  . Alcohol use: No  . Drug use: No  . Sexual activity: Not on file  Lifestyle  . Physical activity:    Days  per week: Not on file    Minutes per session: Not on file  . Stress: Not on file  Relationships  . Social connections:    Talks on phone: Not on file    Gets together: Not on file    Attends religious service: Not on file    Active member of club or organization: Not on file    Attends meetings of clubs or organizations: Not on file    Relationship status: Not on file  . Intimate partner violence:    Fear of current or ex partner: Not on file    Emotionally abused: Not on file    Physically abused: Not on file    Forced sexual activity: Not on file  Other Topics Concern  . Not on file  Social History Narrative   Lives in [place] with her spouse.  Retired    Family History    Problem Relation Age of Onset  . Breast cancer Mother   . Lung cancer Father     ROS- All systems are reviewed and negative except as per the HPI above  Physical Exam: Vitals:   06/25/18 1016  BP: 140/84  Pulse: 69  Weight: 119 lb (54 kg)  Height: 5\' 2"  (1.575 m)   Wt Readings from Last 3 Encounters:  06/25/18 119 lb (54 kg)  03/06/18 122 lb 12.8 oz (55.7 kg)  12/16/17 115 lb 3.2 oz (52.3 kg)    Labs: Lab Results  Component Value Date   NA 133 (L) 10/29/2017   K 4.5 10/29/2017   CL 101 10/29/2017   CO2 27 10/29/2017   GLUCOSE 88 10/29/2017   BUN 35 (H) 10/29/2017   CREATININE 1.84 (H) 10/29/2017   CALCIUM 9.0 10/29/2017   No results found for: INR No results found for: CHOL, HDL, LDLCALC, TRIG   GEN- The patient is well appearing, alert and oriented x 3 today.   Head- normocephalic, atraumatic Eyes-  Sclera clear, conjunctiva pink Ears- hearing intact Oropharynx- clear Neck- supple, no JVP Lymph- no cervical lymphadenopathy Lungs- Clear to ausculation bilaterally, normal work of breathing Heart  paced, no murmurs, rubs or gallops, PMI not laterally displaced GI- soft, NT, ND, + BS Extremities- no clubbing, cyanosis, or edema MS- no significant deformity or atrophy Skin- no rash or lesion Psych- euthymic mood, full affect Neuro- strength and sensation are intact  EKG- AV dual-paced pr int 182 ms, qrs int 164 ms, qtc 546 ms EPIC records reviewed    Assessment and Plan: 1. Afib with rvr/tachy/brady syndrome, CHB S/p PPM Has now failed flecainide with persistent afib since the first of April and pt is symptomatic Discussed with Dr. [name] and will stop flecainide for 3 day washout and start amiodarone load at 200 mg bid on Sunday 7/14 Continue Eliquis 2.5 mg bid for a chadsvasc score of at least 5, no missed doses  2.  Edema No signs of fluid overload Continue with lasix 20 mg qod  F/u here for EKG on amiodarone 7/19  Julietta Batterman C. Cicley Ganesh, [facility] [address] [place], [place] [address] [phone]

## 2018-07-04 ENCOUNTER — Other Ambulatory Visit: Payer: Self-pay

## 2018-07-04 ENCOUNTER — Ambulatory Visit (HOSPITAL_COMMUNITY)
Admission: RE | Admit: 2018-07-04 | Discharge: 2018-07-04 | Disposition: A | Discharge status: Home / Self Care | Payer: PPO | Source: Ambulatory Visit | Attending: Nurse Practitioner | Admitting: Nurse Practitioner

## 2018-07-04 DIAGNOSIS — I481 Persistent atrial fibrillation: Secondary | ICD-10-CM | POA: Diagnosis not present

## 2018-07-04 NOTE — Progress Notes (Signed)
Pt has been loading on amiodarone 200 mg bid for one week and is in for EKG. She is in a v paced rhythm at 88 bpm. Qtc at 491 ms. She feels improved off flecainide and on amiodarone. Is c/o of some trouble sleeping on amiodarone and advised to  try to move the pill up earlier in the day. If she can not tolerate with this change may drop down to 200 mg daily.I will see back in 2 weeks and if still v paced will have device interrogated and will plan for cardioversion in about 2-4 weeks.

## 2018-07-18 ENCOUNTER — Encounter (HOSPITAL_COMMUNITY): Payer: Self-pay | Admitting: Nurse Practitioner

## 2018-07-18 ENCOUNTER — Other Ambulatory Visit (HOSPITAL_COMMUNITY): Payer: Self-pay | Admitting: Nurse Practitioner

## 2018-07-18 ENCOUNTER — Ambulatory Visit (HOSPITAL_COMMUNITY)
Admission: RE | Admit: 2018-07-18 | Discharge: 2018-07-18 | Disposition: A | Discharge status: Home / Self Care | Payer: PPO | Source: Ambulatory Visit | Attending: Nurse Practitioner | Admitting: Nurse Practitioner

## 2018-07-18 VITALS — BP 174/88 | HR 62 | Ht 62.0 in | Wt 120.0 lb

## 2018-07-18 DIAGNOSIS — Z803 Family history of malignant neoplasm of breast: Secondary | ICD-10-CM | POA: Insufficient documentation

## 2018-07-18 DIAGNOSIS — Z950 Presence of cardiac pacemaker: Secondary | ICD-10-CM | POA: Insufficient documentation

## 2018-07-18 DIAGNOSIS — Z7901 Long term (current) use of anticoagulants: Secondary | ICD-10-CM | POA: Insufficient documentation

## 2018-07-18 DIAGNOSIS — Z87442 Personal history of urinary calculi: Secondary | ICD-10-CM | POA: Diagnosis not present

## 2018-07-18 DIAGNOSIS — Z87891 Personal history of nicotine dependence: Secondary | ICD-10-CM | POA: Insufficient documentation

## 2018-07-18 DIAGNOSIS — E079 Disorder of thyroid, unspecified: Secondary | ICD-10-CM | POA: Insufficient documentation

## 2018-07-18 DIAGNOSIS — Z79899 Other long term (current) drug therapy: Secondary | ICD-10-CM | POA: Diagnosis not present

## 2018-07-18 DIAGNOSIS — I481 Persistent atrial fibrillation: Secondary | ICD-10-CM

## 2018-07-18 DIAGNOSIS — Z801 Family history of malignant neoplasm of trachea, bronchus and lung: Secondary | ICD-10-CM | POA: Insufficient documentation

## 2018-07-18 DIAGNOSIS — M069 Rheumatoid arthritis, unspecified: Secondary | ICD-10-CM | POA: Diagnosis not present

## 2018-07-18 DIAGNOSIS — I509 Heart failure, unspecified: Secondary | ICD-10-CM | POA: Diagnosis not present

## 2018-07-18 DIAGNOSIS — Z79890 Hormone replacement therapy (postmenopausal): Secondary | ICD-10-CM | POA: Diagnosis not present

## 2018-07-18 DIAGNOSIS — I442 Atrioventricular block, complete: Secondary | ICD-10-CM | POA: Diagnosis not present

## 2018-07-18 DIAGNOSIS — I4819 Other persistent atrial fibrillation: Secondary | ICD-10-CM

## 2018-07-18 DIAGNOSIS — I495 Sick sinus syndrome: Secondary | ICD-10-CM | POA: Insufficient documentation

## 2018-07-18 DIAGNOSIS — I110 Hypertensive heart disease with heart failure: Secondary | ICD-10-CM | POA: Diagnosis not present

## 2018-07-18 DIAGNOSIS — M810 Age-related osteoporosis without current pathological fracture: Secondary | ICD-10-CM | POA: Insufficient documentation

## 2018-07-18 DIAGNOSIS — R609 Edema, unspecified: Secondary | ICD-10-CM | POA: Insufficient documentation

## 2018-07-18 LAB — COMPREHENSIVE METABOLIC PANEL
ALT: 19 U/L (ref 0–44)
AST: 30 U/L (ref 15–41)
Albumin: 4 g/dL (ref 3.5–5.0)
Alkaline Phosphatase: 81 U/L (ref 38–126)
Anion gap: 10 (ref 5–15)
BUN: 22 mg/dL (ref 8–23)
CO2: 24 mmol/L (ref 22–32)
Calcium: 9.2 mg/dL (ref 8.9–10.3)
Chloride: 100 mmol/L (ref 98–111)
Creatinine, Ser: 1.9 mg/dL — ABNORMAL HIGH (ref 0.44–1.00)
GFR calc Af Amer: 27 mL/min — ABNORMAL LOW (ref >60)
GFR calc non Af Amer: 23 mL/min — ABNORMAL LOW (ref >60)
Glucose, Bld: 93 mg/dL (ref 70–99)
Potassium: 4.3 mmol/L (ref 3.5–5.1)
Sodium: 134 mmol/L — ABNORMAL LOW (ref 135–145)
Total Bilirubin: 1 mg/dL (ref 0.3–1.2)
Total Protein: 7.1 g/dL (ref 6.5–8.1)

## 2018-07-18 LAB — CBC
HCT: 40 % (ref 36.0–46.0)
Hemoglobin: 13 g/dL (ref 12.0–15.0)
MCH: 28.1 pg (ref 26.0–34.0)
MCHC: 32.5 g/dL (ref 30.0–36.0)
MCV: 86.4 fL (ref 78.0–100.0)
Platelets: 251 10*3/uL (ref 150–400)
RBC: 4.63 MIL/uL (ref 3.87–5.11)
RDW: 13.9 % (ref 11.5–15.5)
WBC: 9.1 10*3/uL (ref 4.0–10.5)

## 2018-07-18 LAB — TSH: TSH: 1.704 u[IU]/mL (ref 0.350–4.500)

## 2018-07-18 NOTE — Progress Notes (Signed)
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 female with a h/o tachy brady syndrome s/p PPM for CHB following amiodarone load. Pt then was placed on flecainide last November  and stayed in [place], but is in the afib clinic today for persistent afib since the first of April, found on device check. Pt states that she is feeling the fatigue and some shortness of breath like she felt in afib. Fluid status is stable.  F/u in afib clinic, 8/2. She has now been loading on amiodarone  long enough that cardioversion can be scheduled. She is tolerating the amiodarone but continues to have fatigue. Fluid status is normal.  Today, she denies symptoms of palpitations, chest pain,  orthopnea, PND, lower extremity edema, dizziness, presyncope, syncope, or neurologic sequela.+ some fatigue  and some shortness of breath with exertion. The patient is tolerating medications without difficulties and is otherwise without complaint today.   Past Medical History:  Diagnosis Date  . Arthritis    RA IN HANDS  . Chronic congestive heart failure with left ventricular diastolic dysfunction ([place])   . Complete heart block ([place])   . Hypertension   . Hypoglycemia   . Nephrolithiasis   . Osteoporosis   . Persistent atrial fibrillation ([place])   . Thyroid disease   . Typical atrial flutter [facility])    Past Surgical History:  Procedure Laterality Date  . APPENDECTOMY    . CARDIOVERSION N/A 05/14/2017   Procedure: CARDIOVERSION;  Surgeon: [name], MD;  Location: [facility];  Service: Cardiovascular;  Laterality: N/A;  . PACEMAKER IMPLANT N/A 06/06/2017   Procedure: Pacemaker Implant;  Surgeon: [name], MD;  Location: [facility];  Service: Cardiovascular;  Laterality: N/A;    Current Outpatient Medications  Medication Sig Dispense Refill  . amiodarone (PACERONE) 200 MG tablet Take 1 tablet twice a day for 1 month then reduce to 1 tablet  daily 60 tablet 0  . amLODipine (NORVASC) 2.5 MG tablet Take 1 tablet by mouth daily.  3  . ELIQUIS 2.5 MG TABS tablet TAKE 1 TABLET BY MOUTH TWICE DAILY 60 tablet 6  . furosemide (LASIX) 20 MG tablet Take 20 mg by mouth every other day.    . levothyroxine (SYNTHROID, LEVOTHROID) 75 MCG tablet Take 75 mcg by mouth daily before breakfast.    . metoprolol succinate (TOPROL-XL) 25 MG 24 hr tablet Take 25 mg by mouth daily.    . nitroGLYCERIN (NITROSTAT) 0.4 MG SL tablet Place 1 tablet (0.4 mg total) under the tongue every 5 (five) minutes as needed for chest pain. 90 tablet 3   No current facility-administered medications for this encounter.     No Known Allergies  Social History   Socioeconomic History  . Marital status: Married    Spouse name: Not on file  . Number of children: 1  . Years of education: Not on file  . Highest education level: Not on file  Occupational History  . Not on file  Social Needs  . Financial resource strain: Not on file  . Food insecurity:    Worry: Not on file    Inability: Not on file  . Transportation needs:    Medical: Not on file    Non-medical: Not on file  Tobacco Use  . Smoking status: Former Smoker    Types: Cigarettes    Last attempt to quit: 05/18/1969    Years since quitting: 49.2  .  Smokeless tobacco: Never Used  Substance and Sexual Activity  . Alcohol use: No  . Drug use: No  . Sexual activity: Not on file  Lifestyle  . Physical activity:    Days per week: Not on file    Minutes per session: Not on file  . Stress: Not on file  Relationships  . Social connections:    Talks on phone: Not on file    Gets together: Not on file    Attends religious service: Not on file    Active member of club or organization: Not on file    Attends meetings of clubs or organizations: Not on file    Relationship status: Not on file  . Intimate partner violence:    Fear of current or ex partner: Not on file    Emotionally abused: Not on file     Physically abused: Not on file    Forced sexual activity: Not on file  Other Topics Concern  . Not on file  Social History Narrative   Lives in [place] with her spouse.  Retired    Family History  Problem Relation Age of Onset  . Breast cancer Mother   . Lung cancer Father     ROS- All systems are reviewed and negative except as per the HPI above  Physical Exam: Vitals:   07/18/18 1050  BP: (!) 174/88  Pulse: 62  Weight: 120 lb (54.4 kg)  Height: 5\' 2"  (1.575 m)   Wt Readings from Last 3 Encounters:  07/18/18 120 lb (54.4 kg)  06/25/18 119 lb (54 kg)  03/06/18 122 lb 12.8 oz (55.7 kg)    Labs: Lab Results  Component Value Date   NA 134 (L) 07/18/2018   K 4.3 07/18/2018   CL 100 07/18/2018   CO2 24 07/18/2018   GLUCOSE 93 07/18/2018   BUN 22 07/18/2018   CREATININE 1.90 (H) 07/18/2018   CALCIUM 9.2 07/18/2018   No results found for: INR No results found for: CHOL, HDL, LDLCALC, TRIG   GEN- The patient is well appearing, alert and oriented x 3 today.   Head- normocephalic, atraumatic Eyes-  Sclera clear, conjunctiva pink Ears- hearing intact Oropharynx- clear Neck- supple, no JVP Lymph- no cervical lymphadenopathy Lungs- Clear to ausculation bilaterally, normal work of breathing Heart  paced, no murmurs, rubs or gallops, PMI not laterally displaced GI- soft, NT, ND, + BS Extremities- no clubbing, cyanosis, or edema MS- no significant deformity or atrophy Skin- no rash or lesion Psych- euthymic mood, full affect Neuro- strength and sensation are intact  EKG-v paced at 62 bpm [employer] [facility] service used to interrogate device and reported that pt continued to  be persistent since April. Normal functioning pacer.     Assessment and Plan: 1. Afib with rvr/tachy/brady syndrome, CHB S/p PPM Has now failed flecainide with persistent afib since the first of April and pt is symptomatic Has been loading on amiodarone 200 mg bid long enough to  schedule cardioversion, conntinue at 200 mg bid until f/u in afib clinic Scheduled for 8/7 Continue Eliquis 2.5 mg bid for a chadsvasc score of at least 5, no missed doses  2.  Edema No signs of fluid overload Continue with lasix 20 mg qod  F/u here one week following cardioversion  Butch Penny C. Latoiya Maradiaga, [facility] [address] [place], [place] [address] [phone]

## 2018-07-18 NOTE — Patient Instructions (Signed)
Cardioversion scheduled for Wednesday, August 7th  - Arrive at the [facility] and go to admitting at 12PM  -Do not eat or drink anything after midnight the night prior to your procedure.  - Take all your morning medication with a sip of water prior to arrival.  - You will not be able to drive home after your procedure.

## 2018-07-23 ENCOUNTER — Ambulatory Visit (HOSPITAL_COMMUNITY): Payer: PPO | Admitting: Anesthesiology

## 2018-07-23 ENCOUNTER — Ambulatory Visit (HOSPITAL_COMMUNITY)
Admission: RE | Admit: 2018-07-23 | Discharge: 2018-07-23 | Disposition: A | Discharge status: Home / Self Care | Payer: PPO | Source: Ambulatory Visit | Attending: Cardiology | Admitting: Cardiology

## 2018-07-23 ENCOUNTER — Other Ambulatory Visit: Payer: Self-pay

## 2018-07-23 ENCOUNTER — Encounter (HOSPITAL_COMMUNITY): Payer: Self-pay | Admitting: Anesthesiology

## 2018-07-23 ENCOUNTER — Encounter (HOSPITAL_COMMUNITY)
Admission: RE | Disposition: A | Discharge status: Home / Self Care | Payer: Self-pay | Source: Ambulatory Visit | Attending: Cardiology

## 2018-07-23 DIAGNOSIS — I483 Typical atrial flutter: Secondary | ICD-10-CM | POA: Insufficient documentation

## 2018-07-23 DIAGNOSIS — E162 Hypoglycemia, unspecified: Secondary | ICD-10-CM | POA: Diagnosis not present

## 2018-07-23 DIAGNOSIS — R609 Edema, unspecified: Secondary | ICD-10-CM | POA: Insufficient documentation

## 2018-07-23 DIAGNOSIS — Z87891 Personal history of nicotine dependence: Secondary | ICD-10-CM | POA: Insufficient documentation

## 2018-07-23 DIAGNOSIS — Z87442 Personal history of urinary calculi: Secondary | ICD-10-CM | POA: Diagnosis not present

## 2018-07-23 DIAGNOSIS — Z79890 Hormone replacement therapy (postmenopausal): Secondary | ICD-10-CM | POA: Insufficient documentation

## 2018-07-23 DIAGNOSIS — I481 Persistent atrial fibrillation: Secondary | ICD-10-CM | POA: Diagnosis not present

## 2018-07-23 DIAGNOSIS — Z79899 Other long term (current) drug therapy: Secondary | ICD-10-CM | POA: Diagnosis not present

## 2018-07-23 DIAGNOSIS — I509 Heart failure, unspecified: Secondary | ICD-10-CM | POA: Diagnosis not present

## 2018-07-23 DIAGNOSIS — Z9889 Other specified postprocedural states: Secondary | ICD-10-CM | POA: Insufficient documentation

## 2018-07-23 DIAGNOSIS — Z7901 Long term (current) use of anticoagulants: Secondary | ICD-10-CM | POA: Diagnosis not present

## 2018-07-23 DIAGNOSIS — I110 Hypertensive heart disease with heart failure: Secondary | ICD-10-CM | POA: Insufficient documentation

## 2018-07-23 DIAGNOSIS — E079 Disorder of thyroid, unspecified: Secondary | ICD-10-CM | POA: Diagnosis not present

## 2018-07-23 DIAGNOSIS — M810 Age-related osteoporosis without current pathological fracture: Secondary | ICD-10-CM | POA: Diagnosis not present

## 2018-07-23 DIAGNOSIS — I5032 Chronic diastolic (congestive) heart failure: Secondary | ICD-10-CM | POA: Diagnosis not present

## 2018-07-23 DIAGNOSIS — E039 Hypothyroidism, unspecified: Secondary | ICD-10-CM | POA: Diagnosis not present

## 2018-07-23 DIAGNOSIS — I4891 Unspecified atrial fibrillation: Secondary | ICD-10-CM | POA: Diagnosis not present

## 2018-07-23 HISTORY — PX: CARDIOVERSION: SHX1299

## 2018-07-23 SURGERY — CARDIOVERSION
Anesthesia: General

## 2018-07-23 MED ORDER — PROPOFOL 10 MG/ML IV BOLUS
INTRAVENOUS | Status: DC | PRN
  Administered 2018-07-23: 40 mg via INTRAVENOUS

## 2018-07-23 MED ORDER — SODIUM CHLORIDE 0.9 % IV SOLN
INTRAVENOUS | Status: DC
  Administered 2018-07-23: 12:00:00 via INTRAVENOUS

## 2018-07-23 MED ORDER — LIDOCAINE 2% (20 MG/ML) 5 ML SYRINGE
INTRAMUSCULAR | Status: DC | PRN
  Administered 2018-07-23: 60 mg via INTRAVENOUS

## 2018-07-23 NOTE — Interval H&P Note (Signed)
History and Physical Interval Note:  07/23/2018 12:28 PM  Brandi Chambers  has presented today for surgery, with the diagnosis of AFIB  The various methods of treatment have been discussed with the patient and family. After consideration of risks, benefits and other options for treatment, the patient has consented to  Procedure(s): CARDIOVERSION (N/A) as a surgical intervention .  The patient's history has been reviewed, patient examined, no change in status, stable for surgery.  I have reviewed the patient's chart and labs.  Questions were answered to the patient's satisfaction.     Kirk Ruths

## 2018-07-23 NOTE — Discharge Instructions (Signed)
Electrical Cardioversion, Care After °This sheet gives you information about how to care for yourself after your procedure. Your health care provider may also give you more specific instructions. If you have problems or questions, contact your health care provider. °What can I expect after the procedure? °After the procedure, it is common to have: °· Some redness on the skin where the shocks were given. ° °Follow these instructions at home: °· Do not drive for 24 hours if you were given a medicine to help you relax (sedative). °· Take over-the-counter and prescription medicines only as told by your health care provider. °· Ask your health care provider how to check your pulse. Check it often. °· Rest for 48 hours after the procedure or as told by your health care provider. °· Avoid or limit your caffeine use as told by your health care provider. °Contact a health care provider if: °· You feel like your heart is beating too quickly or your pulse is not regular. °· You have a serious muscle cramp that does not go away. °Get help right away if: °· You have discomfort in your chest. °· You are dizzy or you feel faint. °· You have trouble breathing or you are short of breath. °· Your speech is slurred. °· You have trouble moving an arm or leg on one side of your body. °· Your fingers or toes turn cold or blue. °This information is not intended to replace advice given to you by your health care provider. Make sure you discuss any questions you have with your health care provider. °Document Released: 09/23/2013 Document Revised: 07/06/2016 Document Reviewed: 06/08/2016 °Elsevier Interactive Patient Education © 2018 [employer]vier Inc[employer] ° °

## 2018-07-23 NOTE — Anesthesia Preprocedure Evaluation (Addendum)
Anesthesia Evaluation  Patient identified by MRN, date of birth, ID band Patient awake    Reviewed: Allergy & Precautions, NPO status , Patient's Chart, lab work & pertinent test results  Airway Mallampati: I       Dental  (+) Teeth Intact, Dental Advisory Given   Pulmonary former smoker,    breath sounds clear to auscultation       Cardiovascular hypertension, Pt. on home beta blockers +CHF  + dysrhythmias Atrial Fibrillation + pacemaker  Rhythm:Irregular     Neuro/Psych negative neurological ROS  negative psych ROS   GI/Hepatic negative GI ROS, Neg liver ROS,   Endo/Other  Hypothyroidism   Renal/GU      Musculoskeletal   Abdominal   Peds  Hematology   Anesthesia Other Findings   Reproductive/Obstetrics                           Lab Results  Component Value Date   WBC 9.1 07/18/2018   HGB 13.0 07/18/2018   HCT 40.0 07/18/2018   MCV 86.4 07/18/2018   PLT 251 07/18/2018   No results found for: INR, PROTIME  EKG: atrial fibrillation.   Anesthesia Physical Anesthesia Plan  ASA: III  Anesthesia Plan: General   Post-op Pain Management:    Induction: Intravenous  PONV Risk Score and Plan: Propofol infusion and Treatment may vary due to age or medical condition  Airway Management Planned: Mask  Additional Equipment: None  Intra-op Plan:   Post-operative Plan:   Informed Consent:   Plan Discussed with:   Anesthesia Plan Comments:         Anesthesia Quick Evaluation

## 2018-07-23 NOTE — Anesthesia Postprocedure Evaluation (Signed)
Anesthesia Post Note  Patient: Brandi Chambers  Procedure(s) Performed: CARDIOVERSION (N/A )     Patient location during evaluation: Endoscopy Anesthesia Type: General Level of consciousness: awake and alert Pain management: pain level controlled Vital Signs Assessment: post-procedure vital signs reviewed and stable Respiratory status: spontaneous breathing, nonlabored ventilation, respiratory function stable and patient connected to nasal cannula oxygen Cardiovascular status: blood pressure returned to baseline and stable Postop Assessment: no apparent nausea or vomiting Anesthetic complications: no    Last Vitals:  Vitals:   07/23/18 1320 07/23/18 1330  BP: (!) 114/58 139/72  Pulse: 60 60  Resp: 19 15  Temp:    SpO2: 96% 97%    Last Pain:  Vitals:   07/23/18 1320  TempSrc:   PainSc: 0-No pain                 Mirko Tailor

## 2018-07-23 NOTE — Transfer of Care (Signed)
Immediate Anesthesia Transfer of Care Note  Patient: Brandi Chambers  Procedure(s) Performed: CARDIOVERSION (N/A )  Patient Location: Endoscopy Unit  Anesthesia Type:General  Level of Consciousness: awake, alert  and oriented  Airway & Oxygen Therapy: Patient Spontanous Breathing and Patient connected to nasal cannula oxygen  Post-op Assessment: Report given to RN, Post -op Vital signs reviewed and stable and Patient moving all extremities X 4  Post vital signs: Reviewed and stable  Last Vitals:  Vitals Value Taken Time  BP 135/70 07/23/2018  1:17 PM  Temp    Pulse 77 07/23/2018  1:18 PM  Resp 15 07/23/2018  1:18 PM  SpO2 97 % 07/23/2018  1:18 PM  Vitals shown include unvalidated device data.  Last Pain:  Vitals:   07/23/18 1312  TempSrc:   PainSc: 0-No pain         Complications: No apparent anesthesia complications

## 2018-07-23 NOTE — Anesthesia Procedure Notes (Signed)
Procedure Name: General with mask airway Date/Time: 07/23/2018 1:05 PM Performed by: Kyung Rudd, CRNA Pre-anesthesia Checklist: Patient identified, Emergency Drugs available, Suction available, Patient being monitored and Timeout performed Patient Re-evaluated:Patient Re-evaluated prior to induction Oxygen Delivery Method: Ambu bag Preoxygenation: Pre-oxygenation with 100% oxygen Induction Type: IV induction Ventilation: Mask ventilation without difficulty Dental Injury: Teeth and Oropharynx as per pre-operative assessment

## 2018-07-23 NOTE — Procedures (Signed)
Electrical Cardioversion Procedure Note Brandi Chambers [id number] 03/13/1932  Procedure: Electrical Cardioversion Indications:  Atrial Fibrillation  Procedure Details Consent: Risks of procedure as well as the alternatives and risks of each were explained to the (patient/caregiver).  Consent for procedure obtained. Time Out: Verified patient identification, verified procedure, site/side was marked, verified correct patient position, special equipment/implants available, medications/allergies/relevent history reviewed, required imaging and test results available.  Performed  Patient placed on cardiac monitor, pulse oximetry, supplemental oxygen as necessary.  Sedation given: Pt sedated by anesthesia with lidocaine 60 mg and diprovan 40 mg IV. Pacer pads placed anterior and posterior chest.  Cardioverted 1 time(s).  Cardioverted at 120J.  Evaluation Findings: Post procedure EKG shows: AV paced rhythm Complications: None Patient did tolerate procedure well.   Kirk Ruths 07/23/2018, 12:26 PM

## 2018-07-23 NOTE — H&P (Signed)
ATRIAL FIB OFFICE VISIT   07/18/2018 [place]    Sherran Needs, NP  Cardiology   Persistent atrial fibrillation [facility])  Dx   Atrial Fibrillation ; Referred by [name], MD  Reason for Visit   Additional Documentation   Vitals:   BP 174/88    Pulse 62   Ht 5\' 2"  (1.575 m)   Wt 120 lb (54.4 kg)   LMP (LMP Unknown)   BMI 21.95 kg/m   BSA 1.54 m   Flowsheets:   MEWS Score,   Anthropometrics     Encounter Info:   Billing Info,   History,   Allergies,   Detailed Report     All Notes   Progress Notes by Sherran Needs, NP at 07/18/2018 1:01 PM  Author: Sherran Needs, NP Author Type: Nurse Practitioner Filed: 07/18/2018 1:10 PM  Note Status: Signed Cosign: Cosign Not Required Date of Service: 07/18/2018 1:01 PM  Editor: Sherran Needs, NP (Nurse Practitioner)  Expand All Collapse All     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 female with a h/o tachy brady syndrome s/p PPM for CHB following amiodarone load. Pt then was placed on flecainide last November  and stayed in [place], but is in the afib clinic today for persistent afib since the first of April, found on device check. Pt states that she is feeling the fatigue and some shortness of breath like she felt in afib. Fluid status is stable.  F/u in afib clinic, 8/2. She has now been loading on amiodarone  long enough that cardioversion can be scheduled. She is tolerating the amiodarone but continues to have fatigue. Fluid status is normal.  Today, she denies symptoms of palpitations, chest pain,  orthopnea, PND, lower extremity edema, dizziness, presyncope, syncope, or neurologic sequela.+ some fatigue  and some shortness of breath with exertion. The patient is tolerating medications without difficulties and is otherwise without complaint today.       Past Medical History:  Diagnosis Date  . Arthritis    RA IN  HANDS  . Chronic congestive heart failure with left ventricular diastolic dysfunction ([place])   . Complete heart block ([place])   . Hypertension   . Hypoglycemia   . Nephrolithiasis   . Osteoporosis   . Persistent atrial fibrillation ([place])   . Thyroid disease   . Typical atrial flutter [facility])         Past Surgical History:  Procedure Laterality Date  . APPENDECTOMY    . CARDIOVERSION N/A 05/14/2017   Procedure: CARDIOVERSION;  Surgeon: [name], MD;  Location: [place];  Service: Cardiovascular;  Laterality: N/A;  . PACEMAKER IMPLANT N/A 06/06/2017   Procedure: Pacemaker Implant;  Surgeon: [name], MD;  Location: [place] [facility];  Service: Cardiovascular;  Laterality: N/A;          Current Outpatient Medications  Medication Sig Dispense Refill  . amiodarone (PACERONE) 200 MG tablet Take 1 tablet twice a day for 1 month then reduce to 1 tablet daily 60 tablet 0  . amLODipine (NORVASC) 2.5 MG tablet Take 1 tablet by mouth daily.  3  . ELIQUIS 2.5 MG TABS tablet TAKE 1 TABLET BY MOUTH TWICE DAILY 60 tablet 6  . furosemide (LASIX) 20 MG tablet Take 20 mg by mouth every other day.    . levothyroxine (SYNTHROID, LEVOTHROID) 75 MCG tablet Take 75 mcg by mouth  daily before breakfast.    . metoprolol succinate (TOPROL-XL) 25 MG 24 hr tablet Take 25 mg by mouth daily.    . nitroGLYCERIN (NITROSTAT) 0.4 MG SL tablet Place 1 tablet (0.4 mg total) under the tongue every 5 (five) minutes as needed for chest pain. 90 tablet 3   No current facility-administered medications for this encounter.     No Known Allergies  Social History        Socioeconomic History  . Marital status: Married    Spouse name: Not on file  . Number of children: 1  . Years of education: Not on file  . Highest education level: Not on file  Occupational History  . Not on file  Social Needs  . Financial resource strain: Not on file  . Food insecurity:    Worry: Not  on file    Inability: Not on file  . Transportation needs:    Medical: Not on file    Non-medical: Not on file  Tobacco Use  . Smoking status: Former Smoker    Types: Cigarettes    Last attempt to quit: 05/18/1969    Years since quitting: 49.2  . Smokeless tobacco: Never Used  Substance and Sexual Activity  . Alcohol use: No  . Drug use: No  . Sexual activity: Not on file  Lifestyle  . Physical activity:    Days per week: Not on file    Minutes per session: Not on file  . Stress: Not on file  Relationships  . Social connections:    Talks on phone: Not on file    Gets together: Not on file    Attends religious service: Not on file    Active member of club or organization: Not on file    Attends meetings of clubs or organizations: Not on file    Relationship status: Not on file  . Intimate partner violence:    Fear of current or ex partner: Not on file    Emotionally abused: Not on file    Physically abused: Not on file    Forced sexual activity: Not on file  Other Topics Concern  . Not on file  Social History Narrative   Lives in [place] with her spouse.  Retired         Family History  Problem Relation Age of Onset  . Breast cancer Mother   . Lung cancer Father     ROS- All systems are reviewed and negative except as per the HPI above  Physical Exam:    Vitals:   07/18/18 1050  BP: (!) 174/88  Pulse: 62  Weight: 120 lb (54.4 kg)  Height: 5\' 2"  (1.575 m)      Wt Readings from Last 3 Encounters:  07/18/18 120 lb (54.4 kg)  06/25/18 119 lb (54 kg)  03/06/18 122 lb 12.8 oz (55.7 kg)    Labs: Recent Labs       Lab Results  Component Value Date   NA 134 (L) 07/18/2018   K 4.3 07/18/2018   CL 100 07/18/2018   CO2 24 07/18/2018   GLUCOSE 93 07/18/2018   BUN 22 07/18/2018   CREATININE 1.90 (H) 07/18/2018   CALCIUM 9.2 07/18/2018     Recent Labs  No results found for: INR   Recent Labs  No  results found for: CHOL, HDL, LDLCALC, TRIG     GEN- The patient is well appearing, alert and oriented x 3 today.   Head- normocephalic, atraumatic Eyes-  Sclera clear, conjunctiva pink Ears- hearing intact Oropharynx- clear Neck- supple, no JVP Lymph- no cervical lymphadenopathy Lungs- Clear to ausculation bilaterally, normal work of breathing Heart  paced, no murmurs, rubs or gallops, PMI not laterally displaced GI- soft, NT, ND, + BS Extremities- no clubbing, cyanosis, or edema MS- no significant deformity or atrophy Skin- no rash or lesion Psych- euthymic mood, full affect Neuro- strength and sensation are intact  EKG-v paced at 62 bpm [employer] [facility] [facility] service used to interrogate device and reported that pt continued to  be persistent since April. Normal functioning pacer.     Assessment and Plan: 1. Afib with rvr/tachy/brady syndrome, CHB S/p PPM Has now failed flecainide with persistent afib since the first of April and pt is symptomatic Has been loading on amiodarone 200 mg bid long enough to schedule cardioversion, conntinue at 200 mg bid until f/u in afib clinic Scheduled for 8/7 Continue Eliquis 2.5 mg bid for a chadsvasc score of at least 5, no missed doses  2.  Edema No signs of fluid overload Continue with lasix 20 mg qod  F/u here one week following cardioversion  Butch Penny C. [facility] [address] [place], [place] [address] [phone]      For [place]; compliant with apixaban; no changes Kirk Ruths

## 2018-07-30 ENCOUNTER — Encounter (HOSPITAL_COMMUNITY): Payer: Self-pay | Admitting: Nurse Practitioner

## 2018-07-30 ENCOUNTER — Ambulatory Visit (HOSPITAL_COMMUNITY)
Admission: RE | Admit: 2018-07-30 | Discharge: 2018-07-30 | Disposition: A | Discharge status: Home / Self Care | Payer: PPO | Source: Ambulatory Visit | Attending: Nurse Practitioner | Admitting: Nurse Practitioner

## 2018-07-30 VITALS — BP 176/88 | HR 67 | Ht 62.0 in | Wt 119.0 lb

## 2018-07-30 DIAGNOSIS — I483 Typical atrial flutter: Secondary | ICD-10-CM | POA: Diagnosis not present

## 2018-07-30 DIAGNOSIS — I442 Atrioventricular block, complete: Secondary | ICD-10-CM | POA: Insufficient documentation

## 2018-07-30 DIAGNOSIS — Z79899 Other long term (current) drug therapy: Secondary | ICD-10-CM | POA: Insufficient documentation

## 2018-07-30 DIAGNOSIS — I4819 Other persistent atrial fibrillation: Secondary | ICD-10-CM

## 2018-07-30 DIAGNOSIS — Z7901 Long term (current) use of anticoagulants: Secondary | ICD-10-CM | POA: Diagnosis not present

## 2018-07-30 DIAGNOSIS — I10 Essential (primary) hypertension: Secondary | ICD-10-CM | POA: Insufficient documentation

## 2018-07-30 DIAGNOSIS — Z87891 Personal history of nicotine dependence: Secondary | ICD-10-CM | POA: Diagnosis not present

## 2018-07-30 DIAGNOSIS — R609 Edema, unspecified: Secondary | ICD-10-CM | POA: Insufficient documentation

## 2018-07-30 DIAGNOSIS — I495 Sick sinus syndrome: Secondary | ICD-10-CM | POA: Diagnosis not present

## 2018-07-30 DIAGNOSIS — I481 Persistent atrial fibrillation: Secondary | ICD-10-CM | POA: Diagnosis not present

## 2018-07-30 DIAGNOSIS — E079 Disorder of thyroid, unspecified: Secondary | ICD-10-CM | POA: Diagnosis not present

## 2018-07-30 MED ORDER — AMIODARONE HCL 200 MG PO TABS: 200.0000 mg | ORAL_TABLET | Freq: Every day | ORAL | 3 refills | Status: DC

## 2018-07-30 NOTE — Patient Instructions (Signed)
Decrease Amiodarone to 200mg  ONCE A DAY

## 2018-07-30 NOTE — Progress Notes (Signed)
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 female with a h/o tachy brady syndrome s/p PPM for CHB following amiodarone load. Pt then was placed on flecainide last November  and stayed in [place], but is in the afib clinic today for persistent afib since the first of April, found on device check. Pt states that she is feeling the fatigue and some shortness of breath like she felt in afib. Fluid status is stable.  F/u in afib clinic, 8/2. She has now been loading on amiodarone  long enough that cardioversion can be scheduled. She is tolerating the amiodarone but continues to have fatigue. Fluid status is normal.  F/u in afib clinic, 8/14, pt had successful cardioversion last week and is now in afib clinc for f/u. She is AV paced today and [employer] interrogation was done thru [facility], [employer] rep called and report discussed. Her PPM is working normally and shows that she has been in [place] since cardioversion, 8/7.Marland Kitchen She had elevated BP on arrival but with recheck 158/89. She is feeling a little nauseated and shaky this am and will now reduce amiodarone to 200 mg daily, which should improve these symptoms.  Today, she denies symptoms of palpitations, chest pain,  orthopnea, PND, lower extremity edema, dizziness, presyncope, syncope, or neurologic sequela.+ some fatigue  and some shortness of breath with exertion. The patient is tolerating medications without difficulties and is otherwise without complaint today.   Past Medical History:  Diagnosis Date  . Arthritis    RA IN HANDS  . Chronic congestive heart failure with left ventricular diastolic dysfunction ([place])   . Complete heart block ([place])   . Hypertension   . Hypoglycemia   . Nephrolithiasis   . Osteoporosis   . Persistent atrial fibrillation ([place])   . Thyroid disease   . Typical atrial flutter [facility])    Past Surgical History:  Procedure Laterality Date  . APPENDECTOMY     . CARDIOVERSION N/A 05/14/2017   Procedure: CARDIOVERSION;  Surgeon: [name], MD;  Location: [place];  Service: Cardiovascular;  Laterality: N/A;  . CARDIOVERSION N/A 07/23/2018   Procedure: CARDIOVERSION;  Surgeon: [name], MD;  Location: [facility];  Service: Cardiovascular;  Laterality: N/A;  . PACEMAKER IMPLANT N/A 06/06/2017   Procedure: Pacemaker Implant;  Surgeon: [name], MD;  Location: [place] CV [facility];  Service: Cardiovascular;  Laterality: N/A;    Current Outpatient Medications  Medication Sig Dispense Refill  . amiodarone (PACERONE) 200 MG tablet Take 1 tablet (200 mg total) by mouth daily. 30 tablet 3  . amLODipine (NORVASC) 2.5 MG tablet Take 2.5 mg by mouth daily.   3  . ELIQUIS 2.5 MG TABS tablet TAKE 1 TABLET BY MOUTH TWICE DAILY 60 tablet 6  . furosemide (LASIX) 20 MG tablet Take 20 mg by mouth every other day.    . levothyroxine (SYNTHROID, LEVOTHROID) 75 MCG tablet Take 75 mcg by mouth daily before breakfast.    . metoprolol succinate (TOPROL-XL) 25 MG 24 hr tablet Take 25 mg by mouth daily.    . nitroGLYCERIN (NITROSTAT) 0.4 MG SL tablet Place 1 tablet (0.4 mg total) under the tongue every 5 (five) minutes as needed for chest pain. (Patient not taking: Reported on 07/30/2018) 90 tablet 3   No current facility-administered medications for this encounter.     No Known Allergies  Social History   Socioeconomic History  . Marital status: Married  Spouse name: Not on file  . Number of children: 1  . Years of education: Not on file  . Highest education level: Not on file  Occupational History  . Not on file  Social Needs  . Financial resource strain: Not on file  . Food insecurity:    Worry: Not on file    Inability: Not on file  . Transportation needs:    Medical: Not on file    Non-medical: Not on file  Tobacco Use  . Smoking status: Former Smoker    Types: Cigarettes    Last attempt to quit: 05/18/1969    Years since  quitting: 49.2  . Smokeless tobacco: Never Used  Substance and Sexual Activity  . Alcohol use: No  . Drug use: No  . Sexual activity: Not on file  Lifestyle  . Physical activity:    Days per week: Not on file    Minutes per session: Not on file  . Stress: Not on file  Relationships  . Social connections:    Talks on phone: Not on file    Gets together: Not on file    Attends religious service: Not on file    Active member of club or organization: Not on file    Attends meetings of clubs or organizations: Not on file    Relationship status: Not on file  . Intimate partner violence:    Fear of current or ex partner: Not on file    Emotionally abused: Not on file    Physically abused: Not on file    Forced sexual activity: Not on file  Other Topics Concern  . Not on file  Social History Narrative   Lives in [place] with her spouse.  Retired    Family History  Problem Relation Age of Onset  . Breast cancer Mother   . Lung cancer Father     ROS- All systems are reviewed and negative except as per the HPI above  Physical Exam: Vitals:   07/30/18 0942  BP: (!) 176/88  Pulse: 67  Weight: 54 kg  Height: 5\' 2"  (1.575 m)   Wt Readings from Last 3 Encounters:  07/30/18 54 kg  07/23/18 54.4 kg  07/18/18 54.4 kg    Labs: Lab Results  Component Value Date   NA 134 (L) 07/18/2018   K 4.3 07/18/2018   CL 100 07/18/2018   CO2 24 07/18/2018   GLUCOSE 93 07/18/2018   BUN 22 07/18/2018   CREATININE 1.90 (H) 07/18/2018   CALCIUM 9.2 07/18/2018   No results found for: INR No results found for: CHOL, HDL, LDLCALC, TRIG   GEN- The patient is well appearing, alert and oriented x 3 today.   Head- normocephalic, atraumatic Eyes-  Sclera clear, conjunctiva pink Ears- hearing intact Oropharynx- clear Neck- supple, no JVP Lymph- no cervical lymphadenopathy Lungs- Clear to ausculation bilaterally, normal work of breathing Heart  paced, no murmurs, rubs or gallops, PMI not  laterally displaced GI- soft, NT, ND, + BS Extremities- no clubbing, cyanosis, or edema MS- no significant deformity or atrophy Skin- no rash or lesion Psych- euthymic mood, full affect Neuro- strength and sensation are intact  EKG-a/v paced at 62 bpm [employer] interrogation thru [employer] [facility] shows PPM is functioning normally and pt is staying in [place]       Assessment and Plan: 1. Afib with rvr/tachy/brady syndrome, CHB S/p PPM Has now failed flecainide with persistent afib since the first of April and with recent successful cardioversion  on amio Has been loading on amiodarone 200 mg bid and now will reduce to 200 mg daily as I feel she amy be having some side effects from higher dose Continue Eliquis 2.5 mg bid for a chadsvasc score of at least 5, no missed doses  2.  Edema No signs of fluid overload Continue with lasix 20 mg qod  F/u here one week   Butch Penny C. Ainhoa Rallo, [facility] [address] [place], [place] [address] [phone]

## 2018-07-31 DIAGNOSIS — Z85828 Personal history of other malignant neoplasm of skin: Secondary | ICD-10-CM | POA: Diagnosis not present

## 2018-07-31 DIAGNOSIS — L905 Scar conditions and fibrosis of skin: Secondary | ICD-10-CM | POA: Diagnosis not present

## 2018-08-06 ENCOUNTER — Ambulatory Visit (HOSPITAL_COMMUNITY)
Admission: RE | Admit: 2018-08-06 | Discharge: 2018-08-06 | Disposition: A | Discharge status: Home / Self Care | Payer: PPO | Source: Ambulatory Visit | Attending: Nurse Practitioner | Admitting: Nurse Practitioner

## 2018-08-06 ENCOUNTER — Other Ambulatory Visit: Payer: Self-pay | Admitting: Internal Medicine

## 2018-08-06 VITALS — BP 108/60 | HR 59 | Ht 62.0 in | Wt 115.6 lb

## 2018-08-06 DIAGNOSIS — I481 Persistent atrial fibrillation: Secondary | ICD-10-CM

## 2018-08-06 DIAGNOSIS — Z79890 Hormone replacement therapy (postmenopausal): Secondary | ICD-10-CM | POA: Insufficient documentation

## 2018-08-06 DIAGNOSIS — Z87891 Personal history of nicotine dependence: Secondary | ICD-10-CM | POA: Diagnosis not present

## 2018-08-06 DIAGNOSIS — Z950 Presence of cardiac pacemaker: Secondary | ICD-10-CM | POA: Insufficient documentation

## 2018-08-06 DIAGNOSIS — Z7901 Long term (current) use of anticoagulants: Secondary | ICD-10-CM | POA: Diagnosis not present

## 2018-08-06 DIAGNOSIS — I4819 Other persistent atrial fibrillation: Secondary | ICD-10-CM

## 2018-08-06 DIAGNOSIS — I10 Essential (primary) hypertension: Secondary | ICD-10-CM | POA: Diagnosis not present

## 2018-08-06 DIAGNOSIS — E079 Disorder of thyroid, unspecified: Secondary | ICD-10-CM | POA: Diagnosis not present

## 2018-08-06 DIAGNOSIS — I495 Sick sinus syndrome: Secondary | ICD-10-CM | POA: Diagnosis not present

## 2018-08-06 DIAGNOSIS — I4891 Unspecified atrial fibrillation: Secondary | ICD-10-CM | POA: Diagnosis present

## 2018-08-06 DIAGNOSIS — R9431 Abnormal electrocardiogram [ECG] [EKG]: Secondary | ICD-10-CM | POA: Diagnosis not present

## 2018-08-06 DIAGNOSIS — I442 Atrioventricular block, complete: Secondary | ICD-10-CM | POA: Insufficient documentation

## 2018-08-06 DIAGNOSIS — Z79899 Other long term (current) drug therapy: Secondary | ICD-10-CM | POA: Insufficient documentation

## 2018-08-06 DIAGNOSIS — R609 Edema, unspecified: Secondary | ICD-10-CM | POA: Diagnosis not present

## 2018-08-06 NOTE — Progress Notes (Signed)
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 female with a h/o tachy brady syndrome s/p PPM for CHB following amiodarone load. Pt then was placed on flecainide last November  and stayed in [place], but is in the afib clinic today for persistent afib since the first of April, found on device check. Pt states that she is feeling the fatigue and some shortness of breath like she felt in afib. Fluid status is stable.  F/u in afib clinic, 8/2. She has now been loading on amiodarone  long enough that cardioversion can be scheduled. She is tolerating the amiodarone but continues to have fatigue. Fluid status is normal.  F/u 8/21, on last visit amiodarone was lowered to 200 mg a day for pt feeling fatigued and having some nausea. She was in SR. She is now feeling better but still some fatigue. Will lower to 100 mg daily, hopefully will great improved the way she feels and maintain SR.  Today, she denies symptoms of palpitations, chest pain,  orthopnea, PND, lower extremity edema, dizziness, presyncope, syncope, or neurologic sequela.+ some fatigue  and some shortness of breath with exertion. The patient is tolerating medications without difficulties and is otherwise without complaint today.   Past Medical History:  Diagnosis Date  . Arthritis    RA IN HANDS  . Chronic congestive heart failure with left ventricular diastolic dysfunction ([place])   . Complete heart block ([place])   . Hypertension   . Hypoglycemia   . Nephrolithiasis   . Osteoporosis   . Persistent atrial fibrillation ([place])   . Thyroid disease   . Typical atrial flutter [facility])    Past Surgical History:  Procedure Laterality Date  . APPENDECTOMY    . CARDIOVERSION N/A 05/14/2017   Procedure: CARDIOVERSION;  Surgeon: [name], MD;  Location: [place];  Service: Cardiovascular;  Laterality: N/A;  . CARDIOVERSION N/A 07/23/2018   Procedure: CARDIOVERSION;  Surgeon:  [name], MD;  Location: [facility] ENDOSCOPY;  Service: Cardiovascular;  Laterality: N/A;  . PACEMAKER IMPLANT N/A 06/06/2017   Procedure: Pacemaker Implant;  Surgeon: [name], MD;  Location: [place] CV LAB;  Service: Cardiovascular;  Laterality: N/A;    Current Outpatient Medications  Medication Sig Dispense Refill  . amiodarone (PACERONE) 200 MG tablet Take 1 tablet (200 mg total) by mouth daily. (Patient taking differently: Take 200 mg by mouth daily. ) 30 tablet 3  . amLODipine (NORVASC) 2.5 MG tablet Take 2.5 mg by mouth daily.   3  . ELIQUIS 2.5 MG TABS tablet TAKE 1 TABLET BY MOUTH TWICE DAILY 60 tablet 6  . furosemide (LASIX) 20 MG tablet Take 20 mg by mouth every other day.    . levothyroxine (SYNTHROID, LEVOTHROID) 75 MCG tablet Take 75 mcg by mouth daily before breakfast.    . metoprolol succinate (TOPROL-XL) 25 MG 24 hr tablet Take 25 mg by mouth daily.    . nitroGLYCERIN (NITROSTAT) 0.4 MG SL tablet Place 1 tablet (0.4 mg total) under the tongue every 5 (five) minutes as needed for chest pain. 90 tablet 3   No current facility-administered medications for this encounter.     No Known Allergies  Social History   Socioeconomic History  . Marital status: Married    Spouse name: Not on file  . Number of children: 1  . Years of education: Not on file  . Highest education level: Not on file  Occupational History  .  Not on file  Social Needs  . Financial resource strain: Not on file  . Food insecurity:    Worry: Not on file    Inability: Not on file  . Transportation needs:    Medical: Not on file    Non-medical: Not on file  Tobacco Use  . Smoking status: Former Smoker    Types: Cigarettes    Last attempt to quit: 05/18/1969    Years since quitting: 49.2  . Smokeless tobacco: Never Used  Substance and Sexual Activity  . Alcohol use: No  . Drug use: No  . Sexual activity: Not on file  Lifestyle  . Physical activity:    Days per week: Not on file     Minutes per session: Not on file  . Stress: Not on file  Relationships  . Social connections:    Talks on phone: Not on file    Gets together: Not on file    Attends religious service: Not on file    Active member of club or organization: Not on file    Attends meetings of clubs or organizations: Not on file    Relationship status: Not on file  . Intimate partner violence:    Fear of current or ex partner: Not on file    Emotionally abused: Not on file    Physically abused: Not on file    Forced sexual activity: Not on file  Other Topics Concern  . Not on file  Social History Narrative   Lives in [place] with her spouse.  Retired    Family History  Problem Relation Age of Onset  . Breast cancer Mother   . Lung cancer Father     ROS- All systems are reviewed and negative except as per the HPI above  Physical Exam: Vitals:   08/06/18 0945  BP: 108/60  Pulse: (!) 59  Weight: 52.4 kg  Height: 5\' 2"  (1.575 m)   Wt Readings from Last 3 Encounters:  08/06/18 52.4 kg  07/30/18 54 kg  07/23/18 54.4 kg    Labs: Lab Results  Component Value Date   NA 134 (L) 07/18/2018   K 4.3 07/18/2018   CL 100 07/18/2018   CO2 24 07/18/2018   GLUCOSE 93 07/18/2018   BUN 22 07/18/2018   CREATININE 1.90 (H) 07/18/2018   CALCIUM 9.2 07/18/2018   No results found for: INR No results found for: CHOL, HDL, LDLCALC, TRIG   GEN- The patient is well appearing, alert and oriented x 3 today.   Head- normocephalic, atraumatic Eyes-  Sclera clear, conjunctiva pink Ears- hearing intact Oropharynx- clear Neck- supple, no JVP Lymph- no cervical lymphadenopathy Lungs- Clear to ausculation bilaterally, normal work of breathing Heart  paced, no murmurs, rubs or gallops, PMI not laterally displaced GI- soft, NT, ND, + BS Extremities- no clubbing, cyanosis, or edema MS- no significant deformity or atrophy Skin- no rash or lesion Psych- euthymic mood, full affect Neuro- strength and  sensation are intact  EKG-av dual paced  at 61 bpm     Assessment and Plan: 1. Afib with rvr/tachy/brady syndrome, CHB S/p PPM Has now failed flecainide with persistent afib since the first of April and pt is symptomatic Loaded on amiodarone with successful cardioversion and will now try to lower the dose to 100 mg daily as she is having some fatigue, nausea, symptoms greatly improved with lowering from 200 mg bid to 200 mg daily last week Continue Eliquis 2.5 mg bid for a chadsvasc score  of at least 5  2.  Edema No signs of fluid overload Continue with lasix 20 mg qod  F/u with Dr. [name] in October  Remote check in [place]. Dallis Czaja, [facility] [address] [place], [place] [address] [phone]

## 2018-08-19 ENCOUNTER — Other Ambulatory Visit: Payer: Self-pay | Admitting: Nurse Practitioner

## 2018-08-19 NOTE — Telephone Encounter (Signed)
This is a A-Fib clinic pt. Dr. Kayleen Memos prescribed this medication.

## 2018-09-12 DIAGNOSIS — Z950 Presence of cardiac pacemaker: Secondary | ICD-10-CM | POA: Diagnosis not present

## 2018-09-12 DIAGNOSIS — M810 Age-related osteoporosis without current pathological fracture: Secondary | ICD-10-CM | POA: Diagnosis not present

## 2018-09-12 DIAGNOSIS — N183 Chronic kidney disease, stage 3 (moderate): Secondary | ICD-10-CM | POA: Diagnosis not present

## 2018-09-12 DIAGNOSIS — I129 Hypertensive chronic kidney disease with stage 1 through stage 4 chronic kidney disease, or unspecified chronic kidney disease: Secondary | ICD-10-CM | POA: Diagnosis not present

## 2018-09-12 DIAGNOSIS — N2581 Secondary hyperparathyroidism of renal origin: Secondary | ICD-10-CM | POA: Diagnosis not present

## 2018-09-12 DIAGNOSIS — E039 Hypothyroidism, unspecified: Secondary | ICD-10-CM | POA: Diagnosis not present

## 2018-09-12 DIAGNOSIS — I509 Heart failure, unspecified: Secondary | ICD-10-CM | POA: Diagnosis not present

## 2018-09-12 DIAGNOSIS — I701 Atherosclerosis of renal artery: Secondary | ICD-10-CM | POA: Diagnosis not present

## 2018-09-12 DIAGNOSIS — K589 Irritable bowel syndrome without diarrhea: Secondary | ICD-10-CM | POA: Diagnosis not present

## 2018-09-12 DIAGNOSIS — E785 Hyperlipidemia, unspecified: Secondary | ICD-10-CM | POA: Diagnosis not present

## 2018-09-12 DIAGNOSIS — I4891 Unspecified atrial fibrillation: Secondary | ICD-10-CM | POA: Diagnosis not present

## 2018-09-12 DIAGNOSIS — D631 Anemia in chronic kidney disease: Secondary | ICD-10-CM | POA: Diagnosis not present

## 2018-09-14 ENCOUNTER — Other Ambulatory Visit (HOSPITAL_COMMUNITY): Payer: Self-pay | Admitting: Nurse Practitioner

## 2018-09-15 ENCOUNTER — Ambulatory Visit (INDEPENDENT_AMBULATORY_CARE_PROVIDER_SITE_OTHER): Payer: PPO | Admitting: *Deleted

## 2018-09-15 DIAGNOSIS — I442 Atrioventricular block, complete: Secondary | ICD-10-CM

## 2018-09-15 NOTE — Progress Notes (Signed)
Remote pacemaker transmission.   

## 2018-09-17 LAB — CUP PACEART REMOTE DEVICE CHECK
Battery Remaining Longevity: 32 mo
Battery Voltage: 2.92 V
Brady Statistic AP VP Percent: 99.81 %
Brady Statistic AP VS Percent: 0 %
Brady Statistic AS VP Percent: 0.19 %
Brady Statistic AS VS Percent: 0 %
Brady Statistic RA Percent Paced: 99.81 %
Brady Statistic RV Percent Paced: 100 %
Date Time Interrogation Session: 20190930044529
Implantable Lead Implant Date: 20180621
Implantable Lead Implant Date: 20180621
Implantable Lead Location: 753859
Implantable Lead Location: 753860
Implantable Lead Model: 3830
Implantable Lead Model: 5076
Implantable Pulse Generator Implant Date: 20180621
Lead Channel Impedance Value: 285 Ohm
Lead Channel Impedance Value: 285 Ohm
Lead Channel Impedance Value: 380 Ohm
Lead Channel Impedance Value: 418 Ohm
Lead Channel Pacing Threshold Amplitude: 0.875 V
Lead Channel Pacing Threshold Pulse Width: 0.4 ms
Lead Channel Sensing Intrinsic Amplitude: 2 mV
Lead Channel Sensing Intrinsic Amplitude: 2 mV
Lead Channel Sensing Intrinsic Amplitude: 2.625 mV
Lead Channel Sensing Intrinsic Amplitude: 2.625 mV
Lead Channel Setting Pacing Amplitude: 1.5 V
Lead Channel Setting Pacing Amplitude: 5 V
Lead Channel Setting Pacing Pulse Width: 1 ms
Lead Channel Setting Sensing Sensitivity: 0.9 mV

## 2018-09-25 ENCOUNTER — Encounter: Payer: Self-pay | Admitting: Internal Medicine

## 2018-09-25 ENCOUNTER — Ambulatory Visit: Payer: PPO | Admitting: Internal Medicine

## 2018-09-25 VITALS — BP 130/72 | HR 76 | Ht 62.0 in | Wt 119.4 lb

## 2018-09-25 DIAGNOSIS — I442 Atrioventricular block, complete: Secondary | ICD-10-CM | POA: Diagnosis not present

## 2018-09-25 DIAGNOSIS — Z950 Presence of cardiac pacemaker: Secondary | ICD-10-CM | POA: Diagnosis not present

## 2018-09-25 DIAGNOSIS — M25519 Pain in unspecified shoulder: Secondary | ICD-10-CM

## 2018-09-25 DIAGNOSIS — I4819 Other persistent atrial fibrillation: Secondary | ICD-10-CM | POA: Diagnosis not present

## 2018-09-25 DIAGNOSIS — I10 Essential (primary) hypertension: Secondary | ICD-10-CM

## 2018-09-25 LAB — CUP PACEART INCLINIC DEVICE CHECK
Battery Remaining Longevity: 30 mo
Brady Statistic RA Percent Paced: 99.8 %
Brady Statistic RV Percent Paced: 100 %
Date Time Interrogation Session: 20191010170104
Implantable Lead Implant Date: 20180621
Implantable Lead Implant Date: 20180621
Implantable Lead Location: 753859
Implantable Lead Location: 753860
Implantable Lead Model: 3830
Implantable Lead Model: 5076
Implantable Pulse Generator Implant Date: 20180621
Lead Channel Impedance Value: 304 Ohm
Lead Channel Impedance Value: 418 Ohm
Lead Channel Pacing Threshold Amplitude: 1 V
Lead Channel Pacing Threshold Amplitude: 5 V
Lead Channel Pacing Threshold Pulse Width: 0.4 ms
Lead Channel Pacing Threshold Pulse Width: 1 ms
Lead Channel Sensing Intrinsic Amplitude: 2.8 mV
Lead Channel Setting Pacing Amplitude: 1.5 V
Lead Channel Setting Pacing Amplitude: 5 V
Lead Channel Setting Pacing Pulse Width: 1 ms
Lead Channel Setting Sensing Sensitivity: 0.9 mV

## 2018-09-25 NOTE — Patient Instructions (Addendum)
Medication Instructions:  Your physician recommends that you continue on your current medications as directed. Please refer to the Current Medication list given to you today.  Labwork: None ordered.  Testing/Procedures: Your physician has requested that you have a lexiscan myoview. For further information please visit [email or web address]. Please follow instruction sheet, as given.  Please schedule for lexiscan myoview.  Follow-Up: Your physician wants you to follow-up in:  3 months with Orson Eva NP at the [place] clinic.  Remote monitoring is used to monitor your Pacemaker from home. This monitoring reduces the number of office visits required to check your device to one time per year. It allows [place] to keep an eye on the functioning of your device to ensure it is working properly. You are scheduled for a device check from home on 12/15/2018. You may send your transmission at any time that day. If you have a wireless device, the transmission will be sent automatically. After your physician reviews your transmission, you will receive a postcard with your next transmission date.  Any Other Special Instructions Will Be Listed Below (If Applicable).  If you need a refill on your cardiac medications before your next appointment, please call your pharmacy.

## 2018-09-25 NOTE — Progress Notes (Signed)
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 Over the past few weeks, she has had pain between her shoulder blades. Since last being seen in our clinic, the patient reports doing reasonably well.  Today, she denies symptoms of palpitations, chest pain, shortness of breath,  lower extremity edema, dizziness, presyncope, or syncope.  The patient is otherwise without complaint today.   Past Medical History:  Diagnosis Date  . Arthritis    RA IN HANDS  . Chronic congestive heart failure with left ventricular diastolic dysfunction ([place])   . Complete heart block ([place])   . Hypertension   . Hypoglycemia   . Nephrolithiasis   . Osteoporosis   . Persistent atrial fibrillation   . Thyroid disease   . Typical atrial flutter [facility])    Past Surgical History:  Procedure Laterality Date  . APPENDECTOMY    . CARDIOVERSION N/A 05/14/2017   Procedure: CARDIOVERSION;  Surgeon: [name], MD;  Location: [place];  Service: Cardiovascular;  Laterality: N/A;  . CARDIOVERSION N/A 07/23/2018   Procedure: CARDIOVERSION;  Surgeon: [name], MD;  Location: [facility] ENDOSCOPY;  Service: Cardiovascular;  Laterality: N/A;  . PACEMAKER IMPLANT N/A 06/06/2017   Procedure: Pacemaker Implant;  Surgeon: [name], MD;  Location: [place] CV LAB;  Service: Cardiovascular;  Laterality: N/A;    ROS- all systems are reviewed and negative except as per HPI above  Current Outpatient Medications  Medication Sig Dispense Refill  . amiodarone (PACERONE) 200 MG tablet Take 1 tablet (200 mg total) by mouth daily. (Patient taking differently: Take 200 mg by mouth daily. ) 30 tablet 3  . amLODipine (NORVASC) 2.5 MG tablet Take 2.5 mg by mouth daily.   3  . ELIQUIS 2.5 MG TABS tablet TAKE 1 TABLET BY MOUTH TWICE DAILY 60 tablet 6  . furosemide (LASIX) 20 MG tablet Take 20 mg by mouth every other day.    .  levothyroxine (SYNTHROID, LEVOTHROID) 75 MCG tablet Take 75 mcg by mouth daily before breakfast.    . metoprolol succinate (TOPROL-XL) 25 MG 24 hr tablet Take 1 tablet (25 mg total) by mouth daily. 90 tablet 1  . nitroGLYCERIN (NITROSTAT) 0.4 MG SL tablet Place 1 tablet (0.4 mg total) under the tongue every 5 (five) minutes as needed for chest pain. 90 tablet 3   No current facility-administered medications for this visit.     Physical Exam: Vitals:   09/25/18 1559  BP: 130/72  Pulse: 76  SpO2: 96%  Weight: 119 lb 6.4 oz (54.2 kg)  Height: 5\' 2"  (1.575 m)    GEN- The patient is well appearing, alert and oriented x 3 today.   Head- normocephalic, atraumatic Eyes-  Sclera clear, conjunctiva pink Ears- hearing intact Oropharynx- clear Lungs- Clear to ausculation bilaterally, normal work of breathing Chest- pacemaker pocket is well healed Heart- Regular rate and rhythm, no murmurs, rubs or gallops, PMI not laterally displaced GI- soft, NT, ND, + BS Extremities- no clubbing, cyanosis, or edema  Pacemaker interrogation- reviewed in detail today,  See PACEART report  ekg tracing ordered today is personally reviewed and shows sinus with V pacing  Assessment and Plan:  1. Symptomatic complete heart block Normal pacemaker function See Pace Art report No changes today His bundle lead V threshold is 3.5V @ 1 msec unipolar.  This is a His pacing lead.  I have left  her unipolar pacing at 5V  @ 1 msec. today  2. afib She recently had afib and was loaded with amiodarone She appears to be maintaining sinus with this  Continue eliquis  3. HTN Stable No change required today  4. CRI Stable  5. Subscapular pain  I worry that this could be an ischemic equivalent.  I will order lexiscan myoview to evaluate for ischemias as the cause.  [facility] Follow-up with AF clinic in 3 months  [name] MD, [facility] 09/25/2018 4:31 PM

## 2018-10-06 ENCOUNTER — Telehealth (HOSPITAL_COMMUNITY): Payer: Self-pay | Admitting: *Deleted

## 2018-10-06 NOTE — Telephone Encounter (Signed)
  Patient given detailed instructions per Myocardial Perfusion Study Information Sheet for the test on 10/08/18 at 0730. Patient notified to arrive 15 minutes early and that it is imperative to arrive on time for appointment to keep from having the test rescheduled.  If you need to cancel or reschedule your appointment, please call the office within 24 hours of your appointment. . Patient verbalized understanding.Lore Polka, [facility]

## 2018-10-08 ENCOUNTER — Ambulatory Visit (HOSPITAL_COMMUNITY): Payer: PPO | Attending: Cardiology

## 2018-10-08 DIAGNOSIS — I10 Essential (primary) hypertension: Secondary | ICD-10-CM | POA: Diagnosis not present

## 2018-10-08 DIAGNOSIS — M25519 Pain in unspecified shoulder: Secondary | ICD-10-CM | POA: Diagnosis not present

## 2018-10-08 DIAGNOSIS — R0602 Shortness of breath: Secondary | ICD-10-CM | POA: Insufficient documentation

## 2018-10-08 DIAGNOSIS — R6884 Jaw pain: Secondary | ICD-10-CM | POA: Diagnosis not present

## 2018-10-08 LAB — MYOCARDIAL PERFUSION IMAGING
LV dias vol: 58 mL (ref 46–106)
LV sys vol: 12 mL
Peak HR: 88 {beats}/min
Rest HR: 80 {beats}/min
SDS: 5
SRS: 3
SSS: 8
TID: 1.07

## 2018-10-08 MED ORDER — TECHNETIUM TC 99M TETROFOSMIN IV KIT
10.2000 | PACK | Freq: Once | INTRAVENOUS | Status: AC | PRN
  Administered 2018-10-08: 10.2 via INTRAVENOUS
  Filled 2018-10-08: qty 11

## 2018-10-08 MED ORDER — REGADENOSON 0.4 MG/5ML IV SOLN
0.4000 mg | Freq: Once | INTRAVENOUS | Status: AC
  Administered 2018-10-08: 0.4 mg via INTRAVENOUS

## 2018-10-08 MED ORDER — TECHNETIUM TC 99M TETROFOSMIN IV KIT
30.7000 | PACK | Freq: Once | INTRAVENOUS | Status: AC | PRN
  Administered 2018-10-08: 30.7 via INTRAVENOUS
  Filled 2018-10-08: qty 31

## 2018-11-15 ENCOUNTER — Other Ambulatory Visit (HOSPITAL_COMMUNITY): Payer: Self-pay | Admitting: Nurse Practitioner

## 2018-11-25 DIAGNOSIS — M810 Age-related osteoporosis without current pathological fracture: Secondary | ICD-10-CM | POA: Diagnosis not present

## 2018-11-25 DIAGNOSIS — R82998 Other abnormal findings in urine: Secondary | ICD-10-CM | POA: Diagnosis not present

## 2018-11-25 DIAGNOSIS — I10 Essential (primary) hypertension: Secondary | ICD-10-CM | POA: Diagnosis not present

## 2018-11-25 DIAGNOSIS — E7849 Other hyperlipidemia: Secondary | ICD-10-CM | POA: Diagnosis not present

## 2018-11-25 DIAGNOSIS — E038 Other specified hypothyroidism: Secondary | ICD-10-CM | POA: Diagnosis not present

## 2018-12-05 DIAGNOSIS — Z1212 Encounter for screening for malignant neoplasm of rectum: Secondary | ICD-10-CM | POA: Diagnosis not present

## 2018-12-15 ENCOUNTER — Ambulatory Visit (INDEPENDENT_AMBULATORY_CARE_PROVIDER_SITE_OTHER): Payer: PPO

## 2018-12-15 DIAGNOSIS — I442 Atrioventricular block, complete: Secondary | ICD-10-CM | POA: Diagnosis not present

## 2018-12-15 NOTE — Progress Notes (Signed)
Remote pacemaker transmission.   

## 2018-12-16 LAB — CUP PACEART REMOTE DEVICE CHECK
Battery Remaining Longevity: 23 mo
Battery Voltage: 2.91 V
Brady Statistic AP VP Percent: 99.83 %
Brady Statistic AP VS Percent: 0 %
Brady Statistic AS VP Percent: 0.17 %
Brady Statistic AS VS Percent: 0 %
Brady Statistic RA Percent Paced: 99.83 %
Brady Statistic RV Percent Paced: 100 %
Date Time Interrogation Session: 20191230044606
Implantable Lead Implant Date: 20180621
Implantable Lead Implant Date: 20180621
Implantable Lead Location: 753859
Implantable Lead Location: 753860
Implantable Lead Model: 3830
Implantable Lead Model: 5076
Implantable Pulse Generator Implant Date: 20180621
Lead Channel Impedance Value: 285 Ohm
Lead Channel Impedance Value: 304 Ohm
Lead Channel Impedance Value: 399 Ohm
Lead Channel Impedance Value: 399 Ohm
Lead Channel Pacing Threshold Amplitude: 1 V
Lead Channel Pacing Threshold Pulse Width: 0.4 ms
Lead Channel Sensing Intrinsic Amplitude: 2 mV
Lead Channel Sensing Intrinsic Amplitude: 2 mV
Lead Channel Sensing Intrinsic Amplitude: 2.625 mV
Lead Channel Sensing Intrinsic Amplitude: 2.625 mV
Lead Channel Setting Pacing Amplitude: 1.5 V
Lead Channel Setting Pacing Amplitude: 5 V
Lead Channel Setting Pacing Pulse Width: 1 ms
Lead Channel Setting Sensing Sensitivity: 0.9 mV

## 2018-12-23 DIAGNOSIS — I701 Atherosclerosis of renal artery: Secondary | ICD-10-CM | POA: Diagnosis not present

## 2018-12-23 DIAGNOSIS — I442 Atrioventricular block, complete: Secondary | ICD-10-CM | POA: Diagnosis not present

## 2018-12-23 DIAGNOSIS — I4891 Unspecified atrial fibrillation: Secondary | ICD-10-CM | POA: Diagnosis not present

## 2018-12-23 DIAGNOSIS — E7849 Other hyperlipidemia: Secondary | ICD-10-CM | POA: Diagnosis not present

## 2018-12-23 DIAGNOSIS — M810 Age-related osteoporosis without current pathological fracture: Secondary | ICD-10-CM | POA: Diagnosis not present

## 2018-12-23 DIAGNOSIS — Z1389 Encounter for screening for other disorder: Secondary | ICD-10-CM | POA: Diagnosis not present

## 2018-12-23 DIAGNOSIS — I10 Essential (primary) hypertension: Secondary | ICD-10-CM | POA: Diagnosis not present

## 2018-12-23 DIAGNOSIS — N183 Chronic kidney disease, stage 3 (moderate): Secondary | ICD-10-CM | POA: Diagnosis not present

## 2018-12-23 DIAGNOSIS — Z0000 Encounter for general adult medical examination without abnormal findings: Secondary | ICD-10-CM | POA: Diagnosis not present

## 2018-12-23 DIAGNOSIS — E038 Other specified hypothyroidism: Secondary | ICD-10-CM | POA: Diagnosis not present

## 2018-12-23 DIAGNOSIS — R0789 Other chest pain: Secondary | ICD-10-CM | POA: Diagnosis not present

## 2018-12-23 DIAGNOSIS — Z6822 Body mass index (BMI) 22.0-22.9, adult: Secondary | ICD-10-CM | POA: Diagnosis not present

## 2018-12-25 DIAGNOSIS — L819 Disorder of pigmentation, unspecified: Secondary | ICD-10-CM | POA: Diagnosis not present

## 2018-12-25 DIAGNOSIS — L570 Actinic keratosis: Secondary | ICD-10-CM | POA: Diagnosis not present

## 2018-12-30 ENCOUNTER — Ambulatory Visit (HOSPITAL_COMMUNITY)
Admission: RE | Admit: 2018-12-30 | Discharge: 2018-12-30 | Disposition: A | Discharge status: Home / Self Care | Payer: PPO | Source: Ambulatory Visit | Attending: Nurse Practitioner | Admitting: Nurse Practitioner

## 2018-12-30 ENCOUNTER — Encounter (HOSPITAL_COMMUNITY): Payer: Self-pay | Admitting: Nurse Practitioner

## 2018-12-30 VITALS — BP 140/74 | HR 64 | Ht 62.0 in | Wt 123.0 lb

## 2018-12-30 DIAGNOSIS — Z7901 Long term (current) use of anticoagulants: Secondary | ICD-10-CM | POA: Insufficient documentation

## 2018-12-30 DIAGNOSIS — I5032 Chronic diastolic (congestive) heart failure: Secondary | ICD-10-CM | POA: Diagnosis not present

## 2018-12-30 DIAGNOSIS — I480 Paroxysmal atrial fibrillation: Secondary | ICD-10-CM

## 2018-12-30 DIAGNOSIS — Z950 Presence of cardiac pacemaker: Secondary | ICD-10-CM | POA: Diagnosis not present

## 2018-12-30 DIAGNOSIS — Z87891 Personal history of nicotine dependence: Secondary | ICD-10-CM | POA: Insufficient documentation

## 2018-12-30 DIAGNOSIS — R609 Edema, unspecified: Secondary | ICD-10-CM | POA: Insufficient documentation

## 2018-12-30 DIAGNOSIS — I4819 Other persistent atrial fibrillation: Secondary | ICD-10-CM | POA: Diagnosis not present

## 2018-12-30 DIAGNOSIS — Z79899 Other long term (current) drug therapy: Secondary | ICD-10-CM | POA: Diagnosis not present

## 2018-12-30 DIAGNOSIS — I495 Sick sinus syndrome: Secondary | ICD-10-CM | POA: Diagnosis not present

## 2018-12-30 DIAGNOSIS — I110 Hypertensive heart disease with heart failure: Secondary | ICD-10-CM | POA: Insufficient documentation

## 2018-12-30 DIAGNOSIS — Z79890 Hormone replacement therapy (postmenopausal): Secondary | ICD-10-CM | POA: Insufficient documentation

## 2018-12-30 DIAGNOSIS — E079 Disorder of thyroid, unspecified: Secondary | ICD-10-CM | POA: Diagnosis not present

## 2018-12-30 MED ORDER — AMIODARONE HCL 200 MG PO TABS: 100.0000 mg | ORAL_TABLET | Freq: Every day | ORAL | 3 refills | Status: DC

## 2018-12-30 NOTE — Progress Notes (Addendum)
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 Pt then was placed on flecainide last November and afib burden increased so she was placed back on amiodarone.  She is now on 100 mg of amiodarone daily and feels well today. Rarely notices any heart irregularity. Had labs drawn with  PCP in January and will  request those for amiodarone surveillance.   Today, she denies symptoms of palpitations, chest pain,  orthopnea, PND, lower extremity edema, dizziness, presyncope, syncope, or neurologic sequela.+ some fatigue  and some shortness of breath with exertion. The patient is tolerating medications without difficulties and is otherwise without complaint today.   Past Medical History:  Diagnosis Date  . Arthritis    RA IN HANDS  . Chronic congestive heart failure with left ventricular diastolic dysfunction ([place])   . Complete heart block ([place])   . Hypertension   . Hypoglycemia   . Nephrolithiasis   . Osteoporosis   . Persistent atrial fibrillation   . Thyroid disease   . Typical atrial flutter [facility])    Past Surgical History:  Procedure Laterality Date  . APPENDECTOMY    . CARDIOVERSION N/A 05/14/2017   Procedure: CARDIOVERSION;  Surgeon: Brandi Latch, MD;  Location: [place];  Service: Cardiovascular;  Laterality: N/A;  . CARDIOVERSION N/A 07/23/2018   Procedure: CARDIOVERSION;  Surgeon: Brandi Perla, MD;  Location: [facility] ENDOSCOPY;  Service: Cardiovascular;  Laterality: N/A;  . PACEMAKER IMPLANT N/A 06/06/2017   Procedure: Pacemaker Implant;  Surgeon: Brandi Grayer, MD;  Location: [place] CV LAB;  Service: Cardiovascular;  Laterality: N/A;    Current Outpatient Medications  Medication Sig Dispense Refill  . amiodarone (PACERONE) 200 MG tablet Take 1 tablet (200 mg total) by mouth daily. (Patient taking differently: Take 200 mg by  mouth daily. ) 30 tablet 3  . amLODipine (NORVASC) 2.5 MG tablet Take 2.5 mg by mouth daily.   3  . ELIQUIS 2.5 MG TABS tablet TAKE 1 TABLET BY MOUTH TWICE DAILY 60 tablet 6  . furosemide (LASIX) 20 MG tablet Take 20 mg by mouth daily.     Marland Kitchen levothyroxine (SYNTHROID, LEVOTHROID) 75 MCG tablet Take 75 mcg by mouth daily before breakfast.    . metoprolol succinate (TOPROL-XL) 25 MG 24 hr tablet Take 1 tablet (25 mg total) by mouth daily. 90 tablet 1  . nitroGLYCERIN (NITROSTAT) 0.4 MG SL tablet Place 1 tablet (0.4 mg total) under the tongue every 5 (five) minutes as needed for chest pain. (Patient not taking: Reported on 12/30/2018) 90 tablet 3   No current facility-administered medications for this encounter.     No Known Allergies  Social History   Socioeconomic History  . Marital status: Married    Spouse name: Not on file  . Number of children: 1  . Years of education: Not on file  . Highest education level: Not on file  Occupational History  . Not on file  Social Needs  . Financial resource strain: Not on file  . Food insecurity:    Worry: Not on file    Inability: Not on file  . Transportation needs:    Medical: Not on file    Non-medical: Not on file  Tobacco Use  . Smoking status: Former Smoker    Types: Cigarettes    Last attempt to quit: 05/18/1969    Years since  quitting: 49.6  . Smokeless tobacco: Never Used  Substance and Sexual Activity  . Alcohol use: No  . Drug use: No  . Sexual activity: Not on file  Lifestyle  . Physical activity:    Days per week: Not on file    Minutes per session: Not on file  . Stress: Not on file  Relationships  . Social connections:    Talks on phone: Not on file    Gets together: Not on file    Attends religious service: Not on file    Active member of club or organization: Not on file    Attends meetings of clubs or organizations: Not on file    Relationship status: Not on file  . Intimate partner violence:    Fear of  current or ex partner: Not on file    Emotionally abused: Not on file    Physically abused: Not on file    Forced sexual activity: Not on file  Other Topics Concern  . Not on file  Social History Narrative   Lives in [place] with her spouse.  Retired    Family History  Problem Relation Age of Onset  . Breast cancer Mother   . Lung cancer Father     ROS- All systems are reviewed and negative except as per the HPI above  Physical Exam: Vitals:   12/30/18 1018  BP: 140/74  Pulse: 64  Weight: 55.8 kg  Height: 5\' 2"  (1.575 m)   Wt Readings from Last 3 Encounters:  12/30/18 55.8 kg  10/08/18 54 kg  09/25/18 54.2 kg    Labs: Lab Results  Component Value Date   NA 134 (L) 07/18/2018   K 4.3 07/18/2018   CL 100 07/18/2018   CO2 24 07/18/2018   GLUCOSE 93 07/18/2018   BUN 22 07/18/2018   CREATININE 1.90 (H) 07/18/2018   CALCIUM 9.2 07/18/2018   No results found for: INR No results found for: CHOL, HDL, LDLCALC, TRIG   GEN- The patient is well appearing, alert and oriented x 3 today.   Head- normocephalic, atraumatic Eyes-  Sclera clear, conjunctiva pink Ears- hearing intact Oropharynx- clear Neck- supple, no JVP Lymph- no cervical lymphadenopathy Lungs- Clear to ausculation bilaterally, normal work of breathing Heart  paced, no murmurs, rubs or gallops, PMI not laterally displaced GI- soft, NT, ND, + BS Extremities- no clubbing, cyanosis, or edema MS- no significant deformity or atrophy Skin- no rash or lesion Psych- euthymic mood, full affect Neuro- strength and sensation are intact  EKG-av dual paced  at 62 bpm     Assessment and Plan: 1. Afib with rvr/tachy/brady syndrome, CHB S/p PPM Is AV paced and feels very well today Failed flecainide with persistent afib  April 2019  Loaded on amiodarone with successful cardioversion and will now try dose lowered to 100 mg daily as she was having some fatigue, nausea  Continue Eliquis 2.5 mg bid for a  chadsvasc score of at least 5  2.  Edema No signs of fluid overload Continue with lasix 20 mg qod  F/u with Dr. Rayann Chambers in October  Labs are requested from PCP,if liver and tsh were not checked, will have pt to come back for labs  Addendum: labs from PCP reviewed( drawn 1/14)- CBC unremarkable, cmet reveals creatinine at 1.9, liver enzymes normal,TSH normal at 0.5   Brandi Chambers, [facility] [address] [place],  [address] [phone]

## 2018-12-30 NOTE — Addendum Note (Signed)
Encounter addended by: Sherran Needs, NP on: 12/30/2018 11:38 AM  Actions taken: Clinical Note Signed

## 2019-01-05 DIAGNOSIS — Z950 Presence of cardiac pacemaker: Secondary | ICD-10-CM | POA: Diagnosis not present

## 2019-01-05 DIAGNOSIS — I509 Heart failure, unspecified: Secondary | ICD-10-CM | POA: Diagnosis not present

## 2019-01-05 DIAGNOSIS — E785 Hyperlipidemia, unspecified: Secondary | ICD-10-CM | POA: Diagnosis not present

## 2019-01-05 DIAGNOSIS — K589 Irritable bowel syndrome without diarrhea: Secondary | ICD-10-CM | POA: Diagnosis not present

## 2019-01-05 DIAGNOSIS — I4891 Unspecified atrial fibrillation: Secondary | ICD-10-CM | POA: Diagnosis not present

## 2019-01-05 DIAGNOSIS — I129 Hypertensive chronic kidney disease with stage 1 through stage 4 chronic kidney disease, or unspecified chronic kidney disease: Secondary | ICD-10-CM | POA: Diagnosis not present

## 2019-01-05 DIAGNOSIS — D631 Anemia in chronic kidney disease: Secondary | ICD-10-CM | POA: Diagnosis not present

## 2019-01-05 DIAGNOSIS — N2581 Secondary hyperparathyroidism of renal origin: Secondary | ICD-10-CM | POA: Diagnosis not present

## 2019-01-05 DIAGNOSIS — N189 Chronic kidney disease, unspecified: Secondary | ICD-10-CM | POA: Diagnosis not present

## 2019-01-05 DIAGNOSIS — I701 Atherosclerosis of renal artery: Secondary | ICD-10-CM | POA: Diagnosis not present

## 2019-01-05 DIAGNOSIS — N183 Chronic kidney disease, stage 3 (moderate): Secondary | ICD-10-CM | POA: Diagnosis not present

## 2019-01-21 DIAGNOSIS — H40053 Ocular hypertension, bilateral: Secondary | ICD-10-CM | POA: Diagnosis not present

## 2019-01-21 DIAGNOSIS — H04123 Dry eye syndrome of bilateral lacrimal glands: Secondary | ICD-10-CM | POA: Diagnosis not present

## 2019-01-21 DIAGNOSIS — H02834 Dermatochalasis of left upper eyelid: Secondary | ICD-10-CM | POA: Diagnosis not present

## 2019-01-21 DIAGNOSIS — H259 Unspecified age-related cataract: Secondary | ICD-10-CM | POA: Diagnosis not present

## 2019-01-21 DIAGNOSIS — H02831 Dermatochalasis of right upper eyelid: Secondary | ICD-10-CM | POA: Diagnosis not present

## 2019-02-12 ENCOUNTER — Other Ambulatory Visit: Payer: Self-pay | Admitting: Nurse Practitioner

## 2019-03-16 ENCOUNTER — Ambulatory Visit (INDEPENDENT_AMBULATORY_CARE_PROVIDER_SITE_OTHER): Payer: PPO | Admitting: *Deleted

## 2019-03-16 ENCOUNTER — Other Ambulatory Visit: Payer: Self-pay

## 2019-03-16 DIAGNOSIS — I5032 Chronic diastolic (congestive) heart failure: Secondary | ICD-10-CM

## 2019-03-16 DIAGNOSIS — I442 Atrioventricular block, complete: Secondary | ICD-10-CM | POA: Diagnosis not present

## 2019-03-17 ENCOUNTER — Telehealth: Payer: Self-pay

## 2019-03-17 LAB — CUP PACEART REMOTE DEVICE CHECK
Battery Remaining Longevity: 16 mo
Battery Voltage: 2.9 V
Brady Statistic AP VP Percent: 99.89 %
Brady Statistic AP VS Percent: 0 %
Brady Statistic AS VP Percent: 0.11 %
Brady Statistic AS VS Percent: 0 %
Brady Statistic RA Percent Paced: 99.88 %
Brady Statistic RV Percent Paced: 100 %
Date Time Interrogation Session: 20200331104438
Implantable Lead Implant Date: 20180621
Implantable Lead Implant Date: 20180621
Implantable Lead Location: 753859
Implantable Lead Location: 753860
Implantable Lead Model: 3830
Implantable Lead Model: 5076
Implantable Pulse Generator Implant Date: 20180621
Lead Channel Impedance Value: 285 Ohm
Lead Channel Impedance Value: 304 Ohm
Lead Channel Impedance Value: 399 Ohm
Lead Channel Impedance Value: 399 Ohm
Lead Channel Pacing Threshold Amplitude: 1 V
Lead Channel Pacing Threshold Pulse Width: 0.4 ms
Lead Channel Sensing Intrinsic Amplitude: 2.625 mV
Lead Channel Sensing Intrinsic Amplitude: 2.625 mV
Lead Channel Sensing Intrinsic Amplitude: 2.625 mV
Lead Channel Sensing Intrinsic Amplitude: 2.625 mV
Lead Channel Setting Pacing Amplitude: 1.5 V
Lead Channel Setting Pacing Amplitude: 5 V
Lead Channel Setting Pacing Pulse Width: 1 ms
Lead Channel Setting Sensing Sensitivity: 0.9 mV

## 2019-03-17 NOTE — Telephone Encounter (Signed)
Left message for patient to remind of missed remote transmission.  

## 2019-03-24 NOTE — Progress Notes (Signed)
Remote pacemaker transmission.   

## 2019-04-21 ENCOUNTER — Other Ambulatory Visit (HOSPITAL_COMMUNITY): Payer: Self-pay | Admitting: Nurse Practitioner

## 2019-05-19 DIAGNOSIS — D631 Anemia in chronic kidney disease: Secondary | ICD-10-CM | POA: Diagnosis not present

## 2019-05-19 DIAGNOSIS — Z8679 Personal history of other diseases of the circulatory system: Secondary | ICD-10-CM | POA: Diagnosis not present

## 2019-05-19 DIAGNOSIS — I701 Atherosclerosis of renal artery: Secondary | ICD-10-CM | POA: Diagnosis not present

## 2019-05-19 DIAGNOSIS — I509 Heart failure, unspecified: Secondary | ICD-10-CM | POA: Diagnosis not present

## 2019-05-19 DIAGNOSIS — E785 Hyperlipidemia, unspecified: Secondary | ICD-10-CM | POA: Diagnosis not present

## 2019-05-19 DIAGNOSIS — I4891 Unspecified atrial fibrillation: Secondary | ICD-10-CM | POA: Diagnosis not present

## 2019-05-19 DIAGNOSIS — N2581 Secondary hyperparathyroidism of renal origin: Secondary | ICD-10-CM | POA: Diagnosis not present

## 2019-05-19 DIAGNOSIS — I129 Hypertensive chronic kidney disease with stage 1 through stage 4 chronic kidney disease, or unspecified chronic kidney disease: Secondary | ICD-10-CM | POA: Diagnosis not present

## 2019-05-19 DIAGNOSIS — N183 Chronic kidney disease, stage 3 (moderate): Secondary | ICD-10-CM | POA: Diagnosis not present

## 2019-05-19 DIAGNOSIS — M810 Age-related osteoporosis without current pathological fracture: Secondary | ICD-10-CM | POA: Diagnosis not present

## 2019-05-19 DIAGNOSIS — E039 Hypothyroidism, unspecified: Secondary | ICD-10-CM | POA: Diagnosis not present

## 2019-05-19 DIAGNOSIS — Z950 Presence of cardiac pacemaker: Secondary | ICD-10-CM | POA: Diagnosis not present

## 2019-06-05 ENCOUNTER — Other Ambulatory Visit (HOSPITAL_COMMUNITY): Payer: Self-pay | Admitting: Nurse Practitioner

## 2019-06-15 ENCOUNTER — Ambulatory Visit (INDEPENDENT_AMBULATORY_CARE_PROVIDER_SITE_OTHER): Payer: PPO | Admitting: *Deleted

## 2019-06-15 DIAGNOSIS — I495 Sick sinus syndrome: Secondary | ICD-10-CM

## 2019-06-15 DIAGNOSIS — I4819 Other persistent atrial fibrillation: Secondary | ICD-10-CM

## 2019-06-16 LAB — CUP PACEART REMOTE DEVICE CHECK
Battery Remaining Longevity: 11 mo
Battery Voltage: 2.89 V
Brady Statistic AP VP Percent: 99.94 %
Brady Statistic AP VS Percent: 0 %
Brady Statistic AS VP Percent: 0.05 %
Brady Statistic AS VS Percent: 0 %
Brady Statistic RA Percent Paced: 99.94 %
Brady Statistic RV Percent Paced: 100 %
Date Time Interrogation Session: 20200629050342
Implantable Lead Implant Date: 20180621
Implantable Lead Implant Date: 20180621
Implantable Lead Location: 753859
Implantable Lead Location: 753860
Implantable Lead Model: 3830
Implantable Lead Model: 5076
Implantable Pulse Generator Implant Date: 20180621
Lead Channel Impedance Value: 285 Ohm
Lead Channel Impedance Value: 323 Ohm
Lead Channel Impedance Value: 380 Ohm
Lead Channel Impedance Value: 418 Ohm
Lead Channel Pacing Threshold Amplitude: 1 V
Lead Channel Pacing Threshold Pulse Width: 0.4 ms
Lead Channel Sensing Intrinsic Amplitude: 2.5 mV
Lead Channel Sensing Intrinsic Amplitude: 2.5 mV
Lead Channel Sensing Intrinsic Amplitude: 2.625 mV
Lead Channel Sensing Intrinsic Amplitude: 2.625 mV
Lead Channel Setting Pacing Amplitude: 1.5 V
Lead Channel Setting Pacing Amplitude: 5 V
Lead Channel Setting Pacing Pulse Width: 1 ms
Lead Channel Setting Sensing Sensitivity: 0.9 mV

## 2019-06-24 NOTE — Progress Notes (Signed)
Remote pacemaker transmission.   

## 2019-09-15 ENCOUNTER — Ambulatory Visit (INDEPENDENT_AMBULATORY_CARE_PROVIDER_SITE_OTHER): Payer: PPO | Admitting: *Deleted

## 2019-09-15 DIAGNOSIS — I495 Sick sinus syndrome: Secondary | ICD-10-CM

## 2019-09-15 DIAGNOSIS — I4819 Other persistent atrial fibrillation: Secondary | ICD-10-CM

## 2019-09-16 LAB — CUP PACEART REMOTE DEVICE CHECK
Battery Remaining Longevity: 10 mo
Battery Voltage: 2.87 V
Brady Statistic AP VP Percent: 99.96 %
Brady Statistic AP VS Percent: 0 %
Brady Statistic AS VP Percent: 0.04 %
Brady Statistic AS VS Percent: 0 %
Brady Statistic RA Percent Paced: 99.96 %
Brady Statistic RV Percent Paced: 100 %
Date Time Interrogation Session: 20200930045246
Implantable Lead Implant Date: 20180621
Implantable Lead Implant Date: 20180621
Implantable Lead Location: 753859
Implantable Lead Location: 753860
Implantable Lead Model: 3830
Implantable Lead Model: 5076
Implantable Pulse Generator Implant Date: 20180621
Lead Channel Impedance Value: 304 Ohm
Lead Channel Impedance Value: 304 Ohm
Lead Channel Impedance Value: 399 Ohm
Lead Channel Impedance Value: 399 Ohm
Lead Channel Pacing Threshold Amplitude: 1 V
Lead Channel Pacing Threshold Pulse Width: 0.4 ms
Lead Channel Sensing Intrinsic Amplitude: 2.625 mV
Lead Channel Sensing Intrinsic Amplitude: 2.625 mV
Lead Channel Sensing Intrinsic Amplitude: 2.625 mV
Lead Channel Sensing Intrinsic Amplitude: 2.625 mV
Lead Channel Setting Pacing Amplitude: 1.5 V
Lead Channel Setting Pacing Amplitude: 5 V
Lead Channel Setting Pacing Pulse Width: 1 ms
Lead Channel Setting Sensing Sensitivity: 0.9 mV

## 2019-09-25 NOTE — Progress Notes (Signed)
Remote pacemaker transmission.   

## 2019-09-28 ENCOUNTER — Other Ambulatory Visit: Payer: Self-pay

## 2019-09-28 ENCOUNTER — Encounter: Payer: Self-pay | Admitting: Internal Medicine

## 2019-09-28 ENCOUNTER — Telehealth: Payer: Self-pay

## 2019-09-28 ENCOUNTER — Telehealth (INDEPENDENT_AMBULATORY_CARE_PROVIDER_SITE_OTHER): Payer: PPO | Admitting: Internal Medicine

## 2019-09-28 VITALS — BP 122/70 | HR 70 | Ht 62.0 in | Wt 121.0 lb

## 2019-09-28 DIAGNOSIS — I480 Paroxysmal atrial fibrillation: Secondary | ICD-10-CM

## 2019-09-28 DIAGNOSIS — I442 Atrioventricular block, complete: Secondary | ICD-10-CM | POA: Diagnosis not present

## 2019-09-28 DIAGNOSIS — I10 Essential (primary) hypertension: Secondary | ICD-10-CM | POA: Diagnosis not present

## 2019-09-28 NOTE — Progress Notes (Signed)
Electrophysiology TeleHealth Note  Due to national recommendations of social distancing due to [place] 19, an audio telehealth visit is felt to be most appropriate for this patient at this time.  Verbal consent was obtained by me for the telehealth visit today.  The patient does not have capability for a virtual visit.  A phone visit is therefore required today.   Date:  09/28/2019   ID:  Brandi Chambers, DOB 11-Mar-1932, MRN [id number]  Location: patient's home  Provider location:  [facility]  Evaluation Performed: Follow-up visit  PCP:  [name], MD   Electrophysiologist:  Dr [name]  Chief Complaint:  palpitations  History of Present Illness:    Brandi Chambers is a 85 y.o. female who presents via telehealth conferencing today.  Since last being seen in our clinic, the patient reports doing very well.  Today, she denies symptoms of palpitations, chest pain, shortness of breath,  lower extremity edema, dizziness, presyncope, or syncope.  The patient is otherwise without complaint today.  The patient denies symptoms of fevers, chills, cough, or new SOB worrisome for COVID 19.  Past Medical History:  Diagnosis Date  . Arthritis    RA IN HANDS  . Chronic congestive heart failure with left ventricular diastolic dysfunction ([place])   . Complete heart block ([place])   . Hypertension   . Hypoglycemia   . Nephrolithiasis   . Osteoporosis   . Persistent atrial fibrillation ([place])   . Thyroid disease   . Typical atrial flutter [facility])     Past Surgical History:  Procedure Laterality Date  . APPENDECTOMY    . CARDIOVERSION N/A 05/14/2017   Procedure: CARDIOVERSION;  Surgeon: [name], MD;  Location: [place];  Service: Cardiovascular;  Laterality: N/A;  . CARDIOVERSION N/A 07/23/2018   Procedure: CARDIOVERSION;  Surgeon: [name], MD;  Location: [facility] ENDOSCOPY;  Service: Cardiovascular;  Laterality: N/A;  . PACEMAKER IMPLANT N/A 06/06/2017   Procedure: Pacemaker  Implant;  Surgeon: [name], MD;  Location: [place] CV LAB;  Service: Cardiovascular;  Laterality: N/A;    Current Outpatient Medications  Medication Sig Dispense Refill  . amiodarone (PACERONE) 200 MG tablet Take 100 mg by mouth daily.    Marland Kitchen amLODipine (NORVASC) 2.5 MG tablet Take 2.5 mg by mouth daily.   3  . ELIQUIS 2.5 MG TABS tablet TAKE 1 TABLET BY MOUTH TWICE DAILY 60 tablet 6  . furosemide (LASIX) 20 MG tablet Take 20 mg by mouth daily.     Marland Kitchen levothyroxine (SYNTHROID, LEVOTHROID) 75 MCG tablet Take 75 mcg by mouth daily before breakfast.    . metoprolol succinate (TOPROL-XL) 25 MG 24 hr tablet TAKE 1 TABLET BY MOUTH EVERY DAY 90 tablet 2  . nitroGLYCERIN (NITROSTAT) 0.4 MG SL tablet Place 1 tablet (0.4 mg total) under the tongue every 5 (five) minutes as needed for chest pain. 90 tablet 3   No current facility-administered medications for this visit.     Allergies:   Patient has no known allergies.   Social History:  The patient  reports that she quit smoking about 50 years ago. Her smoking use included cigarettes. She has never used smokeless tobacco. She reports that she does not drink alcohol or use drugs.   Family History:  The patient's family history includes Breast cancer in her mother; Lung cancer in her father.   ROS:  Please see the history of present illness.   All other systems are personally reviewed and negative.  Exam:    Vital Signs:  BP 122/70   Pulse 70   Ht 5\' 2"  (1.575 m)   Wt 121 lb (54.9 kg)   LMP  (LMP Unknown)   BMI 22.13 kg/m   Well sounding, alert and conversant   Labs/Other Tests and Data Reviewed:    Recent Labs: No results found for requested labs within last 8760 hours.   Wt Readings from Last 3 Encounters:  09/28/19 121 lb (54.9 kg)  12/30/18 123 lb (55.8 kg)  10/08/18 119 lb (54 kg)     Last device remote is reviewed from [place] PDF which reveals normal device function, no arrhythmias, chronically high RV outputs.   Battery 9 months to ERI   ASSESSMENT & PLAN:    1.  Complete heart block Chronically elevated RV output (His bundle lead) Would plan RV lead revision at time of generator change 9 months estimated longevity Return to see me in 6 months  2. afib Maintaining sinus rhythm with amiodarone On eliquis  3. HTN Stable No change required today    Follow-up:  6 months with me   Patient Risk:  after full review of this patients clinical status, I feel that they are at moderate risk at this time.  Today, I have spent 15 minutes with the patient with telehealth technology discussing arrhythmia management .    SignedThompson Grayer, MD  09/28/2019 4:13 PM     [place] [address] [phone] (office) [phone] (fax)

## 2019-09-28 NOTE — Telephone Encounter (Signed)
The pt states her monitor been blinking a lot. I gave her the number to [place] support to get additional help.

## 2019-09-30 ENCOUNTER — Telehealth: Payer: Self-pay

## 2019-09-30 NOTE — Telephone Encounter (Signed)
I called pt to follow up on her calling [employer] tech support on getting help with the flashing lights on her home remote monitor.LMOVM

## 2019-09-30 NOTE — Telephone Encounter (Signed)
-----   Message from [name], MD sent at 09/28/2019  4:16 PM EDT ----- Patient is worried about her transmitter "flickering"  I told her to unplug it and then plug it back in. Please fall her to follow-up on this and make sure the issue is resolved.  Jenny Reichmann, please make sure no alerts have come through also since her last transmission 09/16/2019

## 2019-10-01 NOTE — Telephone Encounter (Signed)
Pt states she have not spoken with [employer] tech support yet but the monitor lights have stopped. Pt thanked me for the call.

## 2019-10-08 DIAGNOSIS — I129 Hypertensive chronic kidney disease with stage 1 through stage 4 chronic kidney disease, or unspecified chronic kidney disease: Secondary | ICD-10-CM | POA: Diagnosis not present

## 2019-10-08 DIAGNOSIS — N184 Chronic kidney disease, stage 4 (severe): Secondary | ICD-10-CM | POA: Diagnosis not present

## 2019-10-08 DIAGNOSIS — I701 Atherosclerosis of renal artery: Secondary | ICD-10-CM | POA: Diagnosis not present

## 2019-10-08 DIAGNOSIS — N183 Chronic kidney disease, stage 3 unspecified: Secondary | ICD-10-CM | POA: Diagnosis not present

## 2019-10-08 DIAGNOSIS — N2581 Secondary hyperparathyroidism of renal origin: Secondary | ICD-10-CM | POA: Diagnosis not present

## 2019-10-08 DIAGNOSIS — D631 Anemia in chronic kidney disease: Secondary | ICD-10-CM | POA: Diagnosis not present

## 2019-10-08 DIAGNOSIS — N189 Chronic kidney disease, unspecified: Secondary | ICD-10-CM | POA: Diagnosis not present

## 2019-10-30 DIAGNOSIS — S32050A Wedge compression fracture of fifth lumbar vertebra, initial encounter for closed fracture: Secondary | ICD-10-CM | POA: Diagnosis not present

## 2019-10-30 DIAGNOSIS — M545 Low back pain: Secondary | ICD-10-CM | POA: Diagnosis not present

## 2019-10-30 DIAGNOSIS — S32028A Other fracture of second lumbar vertebra, initial encounter for closed fracture: Secondary | ICD-10-CM | POA: Diagnosis not present

## 2019-11-03 ENCOUNTER — Other Ambulatory Visit: Payer: Self-pay | Admitting: Neurosurgery

## 2019-11-03 DIAGNOSIS — S32020A Wedge compression fracture of second lumbar vertebra, initial encounter for closed fracture: Secondary | ICD-10-CM

## 2019-11-20 DIAGNOSIS — S32020A Wedge compression fracture of second lumbar vertebra, initial encounter for closed fracture: Secondary | ICD-10-CM | POA: Diagnosis not present

## 2019-11-30 ENCOUNTER — Other Ambulatory Visit (HOSPITAL_COMMUNITY): Payer: Self-pay | Admitting: Nurse Practitioner

## 2019-11-30 NOTE — Telephone Encounter (Signed)
Prescription refill request for Eliquis received.  Last office visit: 09/28/2019, Allred Scr: 1.65, 10/08/2019 Age: 85 y.o. Weight: 54.9 kg  Prescription refill.

## 2019-12-04 ENCOUNTER — Other Ambulatory Visit: Payer: Self-pay | Admitting: Nurse Practitioner

## 2019-12-04 DIAGNOSIS — R030 Elevated blood-pressure reading, without diagnosis of hypertension: Secondary | ICD-10-CM | POA: Diagnosis not present

## 2019-12-04 DIAGNOSIS — S32020A Wedge compression fracture of second lumbar vertebra, initial encounter for closed fracture: Secondary | ICD-10-CM | POA: Diagnosis not present

## 2019-12-15 ENCOUNTER — Ambulatory Visit (INDEPENDENT_AMBULATORY_CARE_PROVIDER_SITE_OTHER): Payer: PPO | Admitting: *Deleted

## 2019-12-15 DIAGNOSIS — I495 Sick sinus syndrome: Secondary | ICD-10-CM

## 2019-12-16 ENCOUNTER — Telehealth: Payer: Self-pay | Admitting: Student

## 2019-12-16 LAB — CUP PACEART REMOTE DEVICE CHECK
Battery Remaining Longevity: 8 mo
Battery Voltage: 2.84 V
Brady Statistic AP VP Percent: 99.82 %
Brady Statistic AP VS Percent: 0 %
Brady Statistic AS VP Percent: 0.18 %
Brady Statistic AS VS Percent: 0 %
Brady Statistic RA Percent Paced: 99.81 %
Brady Statistic RV Percent Paced: 100 %
Date Time Interrogation Session: 20201228234646
Implantable Lead Implant Date: 20180621
Implantable Lead Implant Date: 20180621
Implantable Lead Location: 753859
Implantable Lead Location: 753860
Implantable Lead Model: 3830
Implantable Lead Model: 5076
Implantable Pulse Generator Implant Date: 20180621
Lead Channel Impedance Value: 266 Ohm
Lead Channel Impedance Value: 285 Ohm
Lead Channel Impedance Value: 380 Ohm
Lead Channel Impedance Value: 380 Ohm
Lead Channel Pacing Threshold Amplitude: 1 V
Lead Channel Pacing Threshold Pulse Width: 0.4 ms
Lead Channel Sensing Intrinsic Amplitude: 2.375 mV
Lead Channel Sensing Intrinsic Amplitude: 2.375 mV
Lead Channel Sensing Intrinsic Amplitude: 2.625 mV
Lead Channel Sensing Intrinsic Amplitude: 2.625 mV
Lead Channel Setting Pacing Amplitude: 1.5 V
Lead Channel Setting Pacing Amplitude: 5 V
Lead Channel Setting Pacing Pulse Width: 1 ms
Lead Channel Setting Sensing Sensitivity: 0.9 mV

## 2019-12-16 NOTE — Telephone Encounter (Signed)
Called to let patient know we would be doing monthly battery checks for approx 7 months to ERI. She is on recall list for OV in April.  She verbalized understanding and knows to call back with any further questions.   Legrand Como [address] [place], PA-C  12/16/2019 11:12 AM

## 2019-12-29 DIAGNOSIS — E038 Other specified hypothyroidism: Secondary | ICD-10-CM | POA: Diagnosis not present

## 2019-12-29 DIAGNOSIS — M810 Age-related osteoporosis without current pathological fracture: Secondary | ICD-10-CM | POA: Diagnosis not present

## 2019-12-29 DIAGNOSIS — E7849 Other hyperlipidemia: Secondary | ICD-10-CM | POA: Diagnosis not present

## 2019-12-30 DIAGNOSIS — E038 Other specified hypothyroidism: Secondary | ICD-10-CM | POA: Diagnosis not present

## 2019-12-30 DIAGNOSIS — M810 Age-related osteoporosis without current pathological fracture: Secondary | ICD-10-CM | POA: Diagnosis not present

## 2019-12-30 DIAGNOSIS — E7849 Other hyperlipidemia: Secondary | ICD-10-CM | POA: Diagnosis not present

## 2020-01-01 DIAGNOSIS — R030 Elevated blood-pressure reading, without diagnosis of hypertension: Secondary | ICD-10-CM | POA: Diagnosis not present

## 2020-01-01 DIAGNOSIS — S32020A Wedge compression fracture of second lumbar vertebra, initial encounter for closed fracture: Secondary | ICD-10-CM | POA: Diagnosis not present

## 2020-01-05 DIAGNOSIS — M545 Low back pain: Secondary | ICD-10-CM | POA: Diagnosis not present

## 2020-01-05 DIAGNOSIS — Z0000 Encounter for general adult medical examination without abnormal findings: Secondary | ICD-10-CM | POA: Diagnosis not present

## 2020-01-05 DIAGNOSIS — I87319 Chronic venous hypertension (idiopathic) with ulcer of unspecified lower extremity: Secondary | ICD-10-CM | POA: Diagnosis not present

## 2020-01-05 DIAGNOSIS — J302 Other seasonal allergic rhinitis: Secondary | ICD-10-CM | POA: Diagnosis not present

## 2020-01-05 DIAGNOSIS — I701 Atherosclerosis of renal artery: Secondary | ICD-10-CM | POA: Diagnosis not present

## 2020-01-05 DIAGNOSIS — Z1331 Encounter for screening for depression: Secondary | ICD-10-CM | POA: Diagnosis not present

## 2020-01-05 DIAGNOSIS — N1832 Chronic kidney disease, stage 3b: Secondary | ICD-10-CM | POA: Diagnosis not present

## 2020-01-05 DIAGNOSIS — E039 Hypothyroidism, unspecified: Secondary | ICD-10-CM | POA: Diagnosis not present

## 2020-01-05 DIAGNOSIS — I4891 Unspecified atrial fibrillation: Secondary | ICD-10-CM | POA: Diagnosis not present

## 2020-01-05 DIAGNOSIS — I442 Atrioventricular block, complete: Secondary | ICD-10-CM | POA: Diagnosis not present

## 2020-01-05 DIAGNOSIS — M810 Age-related osteoporosis without current pathological fracture: Secondary | ICD-10-CM | POA: Diagnosis not present

## 2020-01-05 DIAGNOSIS — E785 Hyperlipidemia, unspecified: Secondary | ICD-10-CM | POA: Diagnosis not present

## 2020-01-05 DIAGNOSIS — I129 Hypertensive chronic kidney disease with stage 1 through stage 4 chronic kidney disease, or unspecified chronic kidney disease: Secondary | ICD-10-CM | POA: Diagnosis not present

## 2020-01-11 ENCOUNTER — Ambulatory Visit (INDEPENDENT_AMBULATORY_CARE_PROVIDER_SITE_OTHER): Payer: PPO | Admitting: *Deleted

## 2020-01-11 DIAGNOSIS — I495 Sick sinus syndrome: Secondary | ICD-10-CM

## 2020-01-11 LAB — CUP PACEART REMOTE DEVICE CHECK
Battery Remaining Longevity: 7 mo
Battery Voltage: 2.83 V
Brady Statistic AP VP Percent: 99.95 %
Brady Statistic AP VS Percent: 0 %
Brady Statistic AS VP Percent: 0.05 %
Brady Statistic AS VS Percent: 0 %
Brady Statistic RA Percent Paced: 99.95 %
Brady Statistic RV Percent Paced: 100 %
Date Time Interrogation Session: 20210125080046
Implantable Lead Implant Date: 20180621
Implantable Lead Implant Date: 20180621
Implantable Lead Location: 753859
Implantable Lead Location: 753860
Implantable Lead Model: 3830
Implantable Lead Model: 5076
Implantable Pulse Generator Implant Date: 20180621
Lead Channel Impedance Value: 247 Ohm
Lead Channel Impedance Value: 304 Ohm
Lead Channel Impedance Value: 342 Ohm
Lead Channel Impedance Value: 399 Ohm
Lead Channel Pacing Threshold Amplitude: 1 V
Lead Channel Pacing Threshold Pulse Width: 0.4 ms
Lead Channel Sensing Intrinsic Amplitude: 2.375 mV
Lead Channel Sensing Intrinsic Amplitude: 2.375 mV
Lead Channel Sensing Intrinsic Amplitude: 2.625 mV
Lead Channel Sensing Intrinsic Amplitude: 2.625 mV
Lead Channel Setting Pacing Amplitude: 1.5 V
Lead Channel Setting Pacing Amplitude: 5 V
Lead Channel Setting Pacing Pulse Width: 1 ms
Lead Channel Setting Sensing Sensitivity: 0.9 mV

## 2020-02-05 ENCOUNTER — Other Ambulatory Visit: Payer: PPO

## 2020-02-10 ENCOUNTER — Ambulatory Visit (INDEPENDENT_AMBULATORY_CARE_PROVIDER_SITE_OTHER): Payer: PPO | Admitting: *Deleted

## 2020-02-10 DIAGNOSIS — I495 Sick sinus syndrome: Secondary | ICD-10-CM

## 2020-02-10 LAB — CUP PACEART REMOTE DEVICE CHECK
Battery Remaining Longevity: 6 mo
Battery Voltage: 2.81 V
Brady Statistic AP VP Percent: 99.89 %
Brady Statistic AP VS Percent: 0 %
Brady Statistic AS VP Percent: 0.11 %
Brady Statistic AS VS Percent: 0 %
Brady Statistic RA Percent Paced: 99.89 %
Brady Statistic RV Percent Paced: 100 %
Date Time Interrogation Session: 20210223234825
Implantable Lead Implant Date: 20180621
Implantable Lead Implant Date: 20180621
Implantable Lead Location: 753859
Implantable Lead Location: 753860
Implantable Lead Model: 3830
Implantable Lead Model: 5076
Implantable Pulse Generator Implant Date: 20180621
Lead Channel Impedance Value: 285 Ohm
Lead Channel Impedance Value: 304 Ohm
Lead Channel Impedance Value: 399 Ohm
Lead Channel Impedance Value: 399 Ohm
Lead Channel Pacing Threshold Amplitude: 1 V
Lead Channel Pacing Threshold Pulse Width: 0.4 ms
Lead Channel Sensing Intrinsic Amplitude: 2.625 mV
Lead Channel Sensing Intrinsic Amplitude: 2.625 mV
Lead Channel Sensing Intrinsic Amplitude: 3.25 mV
Lead Channel Sensing Intrinsic Amplitude: 3.25 mV
Lead Channel Setting Pacing Amplitude: 1.5 V
Lead Channel Setting Pacing Amplitude: 5 V
Lead Channel Setting Pacing Pulse Width: 1 ms
Lead Channel Setting Sensing Sensitivity: 0.9 mV

## 2020-03-09 DIAGNOSIS — H2513 Age-related nuclear cataract, bilateral: Secondary | ICD-10-CM | POA: Diagnosis not present

## 2020-03-09 DIAGNOSIS — H40053 Ocular hypertension, bilateral: Secondary | ICD-10-CM | POA: Diagnosis not present

## 2020-03-09 DIAGNOSIS — H02831 Dermatochalasis of right upper eyelid: Secondary | ICD-10-CM | POA: Diagnosis not present

## 2020-03-09 DIAGNOSIS — H04123 Dry eye syndrome of bilateral lacrimal glands: Secondary | ICD-10-CM | POA: Diagnosis not present

## 2020-03-09 DIAGNOSIS — H02834 Dermatochalasis of left upper eyelid: Secondary | ICD-10-CM | POA: Diagnosis not present

## 2020-03-15 ENCOUNTER — Ambulatory Visit (INDEPENDENT_AMBULATORY_CARE_PROVIDER_SITE_OTHER): Payer: PPO | Admitting: *Deleted

## 2020-03-15 DIAGNOSIS — I495 Sick sinus syndrome: Secondary | ICD-10-CM

## 2020-03-15 LAB — CUP PACEART REMOTE DEVICE CHECK
Battery Remaining Longevity: 4 mo
Battery Voltage: 2.78 V
Brady Statistic AP VP Percent: 99.85 %
Brady Statistic AP VS Percent: 0 %
Brady Statistic AS VP Percent: 0.15 %
Brady Statistic AS VS Percent: 0 %
Brady Statistic RA Percent Paced: 99.85 %
Brady Statistic RV Percent Paced: 100 %
Date Time Interrogation Session: 20210330004725
Implantable Lead Implant Date: 20180621
Implantable Lead Implant Date: 20180621
Implantable Lead Location: 753859
Implantable Lead Location: 753860
Implantable Lead Model: 3830
Implantable Lead Model: 5076
Implantable Pulse Generator Implant Date: 20180621
Lead Channel Impedance Value: 285 Ohm
Lead Channel Impedance Value: 285 Ohm
Lead Channel Impedance Value: 380 Ohm
Lead Channel Impedance Value: 399 Ohm
Lead Channel Pacing Threshold Amplitude: 1 V
Lead Channel Pacing Threshold Pulse Width: 0.4 ms
Lead Channel Sensing Intrinsic Amplitude: 0.875 mV
Lead Channel Sensing Intrinsic Amplitude: 0.875 mV
Lead Channel Sensing Intrinsic Amplitude: 2.625 mV
Lead Channel Sensing Intrinsic Amplitude: 2.625 mV
Lead Channel Setting Pacing Amplitude: 1.5 V
Lead Channel Setting Pacing Amplitude: 5 V
Lead Channel Setting Pacing Pulse Width: 1 ms
Lead Channel Setting Sensing Sensitivity: 0.9 mV

## 2020-03-15 NOTE — Progress Notes (Signed)
PPM Remote  

## 2020-03-24 DIAGNOSIS — S32020A Wedge compression fracture of second lumbar vertebra, initial encounter for closed fracture: Secondary | ICD-10-CM | POA: Diagnosis not present

## 2020-04-07 ENCOUNTER — Encounter: Payer: PPO | Admitting: Internal Medicine

## 2020-04-11 ENCOUNTER — Ambulatory Visit (INDEPENDENT_AMBULATORY_CARE_PROVIDER_SITE_OTHER): Payer: PPO | Admitting: *Deleted

## 2020-04-11 DIAGNOSIS — I495 Sick sinus syndrome: Secondary | ICD-10-CM

## 2020-04-11 LAB — CUP PACEART REMOTE DEVICE CHECK
Battery Remaining Longevity: 3 mo
Battery Voltage: 2.75 V
Brady Statistic AP VP Percent: 99.62 %
Brady Statistic AP VS Percent: 0 %
Brady Statistic AS VP Percent: 0.37 %
Brady Statistic AS VS Percent: 0 %
Brady Statistic RA Percent Paced: 99.61 %
Brady Statistic RV Percent Paced: 100 %
Date Time Interrogation Session: 20210426004651
Implantable Lead Implant Date: 20180621
Implantable Lead Implant Date: 20180621
Implantable Lead Location: 753859
Implantable Lead Location: 753860
Implantable Lead Model: 3830
Implantable Lead Model: 5076
Implantable Pulse Generator Implant Date: 20180621
Lead Channel Impedance Value: 285 Ohm
Lead Channel Impedance Value: 304 Ohm
Lead Channel Impedance Value: 380 Ohm
Lead Channel Impedance Value: 399 Ohm
Lead Channel Pacing Threshold Amplitude: 1 V
Lead Channel Pacing Threshold Pulse Width: 0.4 ms
Lead Channel Sensing Intrinsic Amplitude: 2.625 mV
Lead Channel Sensing Intrinsic Amplitude: 2.625 mV
Lead Channel Sensing Intrinsic Amplitude: 3.125 mV
Lead Channel Sensing Intrinsic Amplitude: 3.125 mV
Lead Channel Setting Pacing Amplitude: 1.5 V
Lead Channel Setting Pacing Amplitude: 5 V
Lead Channel Setting Pacing Pulse Width: 1 ms
Lead Channel Setting Sensing Sensitivity: 0.9 mV

## 2020-05-09 DIAGNOSIS — D485 Neoplasm of uncertain behavior of skin: Secondary | ICD-10-CM | POA: Diagnosis not present

## 2020-05-09 DIAGNOSIS — C44329 Squamous cell carcinoma of skin of other parts of face: Secondary | ICD-10-CM | POA: Diagnosis not present

## 2020-05-11 ENCOUNTER — Ambulatory Visit (INDEPENDENT_AMBULATORY_CARE_PROVIDER_SITE_OTHER): Payer: PPO | Admitting: *Deleted

## 2020-05-11 ENCOUNTER — Other Ambulatory Visit: Payer: Self-pay

## 2020-05-11 ENCOUNTER — Encounter: Payer: Self-pay | Admitting: Internal Medicine

## 2020-05-11 ENCOUNTER — Ambulatory Visit (INDEPENDENT_AMBULATORY_CARE_PROVIDER_SITE_OTHER): Payer: PPO | Admitting: Internal Medicine

## 2020-05-11 VITALS — BP 128/80 | HR 82 | Ht 62.0 in | Wt 117.6 lb

## 2020-05-11 DIAGNOSIS — R001 Bradycardia, unspecified: Secondary | ICD-10-CM

## 2020-05-11 DIAGNOSIS — I5032 Chronic diastolic (congestive) heart failure: Secondary | ICD-10-CM | POA: Diagnosis not present

## 2020-05-11 DIAGNOSIS — I4819 Other persistent atrial fibrillation: Secondary | ICD-10-CM

## 2020-05-11 DIAGNOSIS — Z950 Presence of cardiac pacemaker: Secondary | ICD-10-CM

## 2020-05-11 DIAGNOSIS — I10 Essential (primary) hypertension: Secondary | ICD-10-CM | POA: Diagnosis not present

## 2020-05-11 DIAGNOSIS — I471 Supraventricular tachycardia: Secondary | ICD-10-CM

## 2020-05-11 NOTE — Patient Instructions (Addendum)
Medication Instructions:  Your physician recommends that you continue on your current medications as directed. Please refer to the Current Medication list given to you today.  Labwork: You will get lab work today:  BMP and CBC  Testing/Procedures: Your physician has requested that you have an echocardiogram. Echocardiography is a painless test that uses sound waves to create images of your heart. It provides your doctor with information about the size and shape of your heart and how well your heart's chambers and valves are working. This procedure takes approximately one hour. There are no restrictions for this procedure.  Please schedule for ECHO  Follow-Up:  SEE INSTRUCTION LETTER  Any Other Special Instructions Will Be Listed Below (If Applicable).  If you need a refill on your cardiac medications before your next appointment, please call your pharmacy.    Pacemaker Battery Change  A pacemaker battery usually lasts 5-15 years (6-7 years on average). A few times a year, you will be asked to visit your health care provider to have a full evaluation of your pacemaker. When the battery is low, your pacemaker battery and generator will be completely replaced. Most often, this procedure is simpler than the first surgery because the wires (leads) that connect the generator to the heart are already in place. There are many things that affect how long a pacemaker battery will last, including:  The age of the pacemaker.  The number of leads you have(1, 2, or 3).  The pacemaker workload. If the pacemaker is helping the heart more often, the battery will not last as long.  Power (voltage) settings. Tell a health care provider about:  Any allergies you have.  All medicines you are taking, including vitamins, herbs, eye drops, creams, and over-the-counter medicines.  Any problems you or family members have had with anesthetic medicines.  Any blood disorders you have.  Any surgeries you  have had, especially the surgeries you have had since your last pacemaker was placed.  Any medical conditions you have.  Whether you are pregnant or may be pregnant.  Any symptoms of heart problems, such as chest pain, trouble breathing, palpitations, light-headedness, or feelings of an abnormal or irregular heartbeat.  Smoking habits. This can affect your reaction to anesthesia. What are the risks? Generally, this is a safe procedure. However, problems may occur, including:  Bleeding.  Bruising of the skin around where the surgical cut (incision) was made.  Pulling apart of the skin at the incision site.  Infection.  Nerve damage.  Injury to other organs, such as the lungs.  Allergic reaction to anesthetics or other medicines used during the procedure.  People with diabetes may have a temporary increase in blood sugar (glucose) after any surgical procedure. What happens before the procedure? Staying hydrated Follow instructions from your health care provider about hydration, which may include:  Up to 2 hours before the procedure - you may continue to drink clear liquids, such as water, clear fruit juice, black coffee, and plain tea. Eating and drinking restrictions Follow instructions from your health care provider about eating and drinking restrictions, which may include:  8 hours before the procedure - stop eating heavy meals or foods such as meat, fried foods, or fatty foods.  6 hours before the procedure - stop eating light meals or foods, such as toast or cereal.  6 hours before the procedure - stop drinking milk or drinks that contain milk.  2 hours before the procedure - stop drinking clear liquids. General instructions  Ask your health care provider about: ? Changing or stopping your regular medicines. This is especially important if you are taking diabetes medicines or blood thinners. ? Taking medicines such as aspirin and ibuprofen. These medicines can thin  your blood. Do not take these medicines before your procedure if your health care provider instructs you not to. ? Taking a sip of water with any approved medicines on the morning of the procedure.  Plan to have someone take you home after the procedure. What happens during the procedure?  To reduce your risk of infection: ? Your health care team will wash or sanitize their hands. ? The skin around the area of the chest will be washed with soap. ? Hair may be removed from the surgical area.  An IV tube will be inserted into one your veins to give you medicine and fluids.  You will be given one or more of the following: ? A medicine to help you relax (sedative). ? A medicine to numb the area where the pacemaker is located (local anesthetic).  You may be given antibiotic medicine to prevent infection.  Your health care provider will make an incision to reopen the pocket holding the pacemaker.  The old pacemaker will be disconnected from the leads.  The leads will be tested.  If needed, the leads will be replaced. If the leads are functioning properly, the new pacemaker will be connected to the existing leads.  A heart monitor and the pacemaker programmer will be used to make sure that the newly implanted pacemaker is working properly.  The incision site will be closed. A bandage (dressing) will be placed over the pacemaker site. The procedure may vary among health care providers and hospitals. What happens after the procedure?  Your blood pressure, heart rate, breathing rate, and blood oxygen level will be monitored until your health care team is satisfied that your pacemaker is working properly.  Your health care provider will tell you when your pacemaker will need to be tested again, or when to return to the office for removal of dressing and stitches.  Do not drive for 24 hours if you were given a sedative.  The dressing will be removed 24-48 hours after the procedure, or as  told by your health care provider. Summary  A pacemaker battery usually lasts 5-15 years (6-7 years on average).  When the battery is low, your pacemaker battery and generator will be completely replaced.  Risks of this procedure include bleeding, bruising, infection, damage to other structures, pulling apart of the skin at the incision site, and allergic reactions to medicines or anesthetics.  Most often, this procedure is simpler than the first surgery because the wires (leads) that connect the generator to the heart are already in place. This information is not intended to replace advice given to you by your health care provider. Make sure you discuss any questions you have with your health care provider. Document Revised: 11/15/2017 Document Reviewed: 11/06/2016 Elsevier Patient Education  2020 [employer].

## 2020-05-11 NOTE — H&P (View-Only) (Signed)
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  Since last being seen in our clinic, the patient reports doing reasonably well.  Her husband died in 05-03-23 at age 27.    Today, she denies symptoms of palpitations, exertional chest pain,   lower extremity edema, dizziness, presyncope, or syncope.  She has occasional SOB.  The patient is otherwise without complaint today.   Past Medical History:  Diagnosis Date  . Arthritis    RA IN HANDS  . Chronic congestive heart failure with left ventricular diastolic dysfunction ([place])   . Complete heart block ([place])   . Hypertension   . Hypoglycemia   . Nephrolithiasis   . Osteoporosis   . Persistent atrial fibrillation ([place])   . Thyroid disease   . Typical atrial flutter [facility])    Past Surgical History:  Procedure Laterality Date  . APPENDECTOMY    . CARDIOVERSION N/A 05/14/2017   Procedure: CARDIOVERSION;  Surgeon: [name], MD;  Location: [place];  Service: Cardiovascular;  Laterality: N/A;  . CARDIOVERSION N/A 07/23/2018   Procedure: CARDIOVERSION;  Surgeon: [name], MD;  [facility] ENDOSCOPY;  Service: Cardiovascular;  Laterality: N/A;  . PACEMAKER IMPLANT N/A 06/06/2017   Procedure: Pacemaker Implant;  Surgeon: [name], MD;  Location: [place] CV LAB;  Service: Cardiovascular;  Laterality: N/A;    ROS- all systems are reviewed and negative except as per HPI above  Current Outpatient Medications  Medication Sig Dispense Refill  . amiodarone (PACERONE) 200 MG tablet Take 100 mg by mouth daily.    Marland Kitchen amLODipine (NORVASC) 2.5 MG tablet Take 2.5 mg by mouth daily.   3  . ELIQUIS 2.5 MG TABS tablet TAKE 1 TABLET BY MOUTH TWICE A DAY 60 tablet 5  . furosemide (LASIX) 20 MG tablet Take 20 mg by mouth daily.     Marland Kitchen levothyroxine (SYNTHROID, LEVOTHROID) 75 MCG tablet Take 75 mcg by mouth daily before breakfast.    .  metoprolol succinate (TOPROL-XL) 25 MG 24 hr tablet TAKE 1 TABLET BY MOUTH EVERY DAY 90 tablet 1  . nitroGLYCERIN (NITROSTAT) 0.4 MG SL tablet Place 1 tablet (0.4 mg total) under the tongue every 5 (five) minutes as needed for chest pain. 90 tablet 3   No current facility-administered medications for this visit.    Physical Exam: Vitals:   05/11/20 1213  BP: 128/80  Pulse: 82  SpO2: 96%  Weight: 117 lb 9.6 oz (53.3 kg)  Height: 5\' 2"  (1.575 m)    GEN- The patient is elderly appearing, alert and oriented x 3 today.   Head- normocephalic, atraumatic Eyes-  Sclera clear, conjunctiva pink Ears- hearing intact Oropharynx- clear Lungs-  normal work of breathing Chest- pacemaker pocket is well healed Heart- Regular rate and rhythm  GI- soft, NT, ND, + BS Extremities- no clubbing, cyanosis, or edema  Pacemaker interrogation- reviewed in detail today,  See PACEART report  ekg tracing ordered today is personally reviewed and shows sinus with His bundle pacing  Echo 04/26/17- EF 45-50%, mild MR  Assessment and Plan:  1. Symptomatic sinus bradycardia and complete heart block Normal pacemaker function though she has high threshold from her His bundle lead.  She is near ERI after only 3 years of pacing.  She will require RV lead revision at this time. See Pace Art report No changes today I suspect the best  approach for her given her advanced age is RV apical pacing going forward. Risks, benefits, and alternatives to RV lead revision and generator change were discussed in detail today.  The patient understands that risks include but are not limited to bleeding, infection, pneumothorax, perforation, tamponade, vascular damage, renal failure, MI, stroke, death, damage to his existing leads, and lead dislodgement and wishes to proceed.  We will therefore schedule the procedure at the next available time. She lives alone and therefore we will likely keep her in the hospital overnight.  2.  Chronic systolic dysfunction EF previously 45-50% Will repeat echo Plan to not proceed with LV lead or left bundle pacing unless her EF has markedly declined  3. Hypertensive cardiovascular disease with CHF Stable No change required today  4. afib Maintaining sinus with amiodarone On eliquis Hold eliquis 24 hours prior to lead revision.  Risks, benefits and potential toxicities for medications prescribed and/or refilled reviewed with patient today.   [name] MD, [facility] 05/11/2020 12:35 PM

## 2020-05-11 NOTE — Progress Notes (Signed)
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  Since last being seen in our clinic, the patient reports doing reasonably well.  Her husband died in 04-27-23 at age 64.    Today, she denies symptoms of palpitations, exertional chest pain,   lower extremity edema, dizziness, presyncope, or syncope.  She has occasional SOB.  The patient is otherwise without complaint today.   Past Medical History:  Diagnosis Date  . Arthritis    RA IN HANDS  . Chronic congestive heart failure with left ventricular diastolic dysfunction ([place])   . Complete heart block ([place])   . Hypertension   . Hypoglycemia   . Nephrolithiasis   . Osteoporosis   . Persistent atrial fibrillation ([place])   . Thyroid disease   . Typical atrial flutter [facility])    Past Surgical History:  Procedure Laterality Date  . APPENDECTOMY    . CARDIOVERSION N/A 05/14/2017   Procedure: CARDIOVERSION;  Surgeon: [name], MD;  Location: [place];  Service: Cardiovascular;  Laterality: N/A;  . CARDIOVERSION N/A 07/23/2018   Procedure: CARDIOVERSION;  Surgeon: [name], MD;  Location: [facility] ENDOSCOPY;  Service: Cardiovascular;  Laterality: N/A;  . PACEMAKER IMPLANT N/A 06/06/2017   Procedure: Pacemaker Implant;  Surgeon: [name], MD;  Location: [place] CV LAB;  Service: Cardiovascular;  Laterality: N/A;    ROS- all systems are reviewed and negative except as per HPI above  Current Outpatient Medications  Medication Sig Dispense Refill  . amiodarone (PACERONE) 200 MG tablet Take 100 mg by mouth daily.    Marland Kitchen amLODipine (NORVASC) 2.5 MG tablet Take 2.5 mg by mouth daily.   3  . ELIQUIS 2.5 MG TABS tablet TAKE 1 TABLET BY MOUTH TWICE A DAY 60 tablet 5  . furosemide (LASIX) 20 MG tablet Take 20 mg by mouth daily.     Marland Kitchen levothyroxine (SYNTHROID, LEVOTHROID) 75 MCG tablet Take 75 mcg by mouth daily before breakfast.    .  metoprolol succinate (TOPROL-XL) 25 MG 24 hr tablet TAKE 1 TABLET BY MOUTH EVERY DAY 90 tablet 1  . nitroGLYCERIN (NITROSTAT) 0.4 MG SL tablet Place 1 tablet (0.4 mg total) under the tongue every 5 (five) minutes as needed for chest pain. 90 tablet 3   No current facility-administered medications for this visit.    Physical Exam: Vitals:   05/11/20 1213  BP: 128/80  Pulse: 82  SpO2: 96%  Weight: 117 lb 9.6 oz (53.3 kg)  Height: 5\' 2"  (1.575 m)    GEN- The patient is elderly appearing, alert and oriented x 3 today.   Head- normocephalic, atraumatic Eyes-  Sclera clear, conjunctiva pink Ears- hearing intact Oropharynx- clear Lungs-  normal work of breathing Chest- pacemaker pocket is well healed Heart- Regular rate and rhythm  GI- soft, NT, ND, + BS Extremities- no clubbing, cyanosis, or edema  Pacemaker interrogation- reviewed in detail today,  See PACEART report  ekg tracing ordered today is personally reviewed and shows sinus with His bundle pacing  Echo 04/26/17- EF 45-50%, mild MR  Assessment and Plan:  1. Symptomatic sinus bradycardia and complete heart block Normal pacemaker function though she has high threshold from her His bundle lead.  She is near ERI after only 3 years of pacing.  She will require RV lead revision at this time. See Pace Art report No changes today I suspect the best  approach for her given her advanced age is RV apical pacing going forward. Risks, benefits, and alternatives to RV lead revision and generator change were discussed in detail today.  The patient understands that risks include but are not limited to bleeding, infection, pneumothorax, perforation, tamponade, vascular damage, renal failure, MI, stroke, death, damage to his existing leads, and lead dislodgement and wishes to proceed.  We will therefore schedule the procedure at the next available time. She lives alone and therefore we will likely keep her in the hospital overnight.  2.  Chronic systolic dysfunction EF previously 45-50% Will repeat echo Plan to not proceed with LV lead or left bundle pacing unless her EF has markedly declined  3. Hypertensive cardiovascular disease with CHF Stable No change required today  4. afib Maintaining sinus with amiodarone On eliquis Hold eliquis 24 hours prior to lead revision.  Risks, benefits and potential toxicities for medications prescribed and/or refilled reviewed with patient today.   [name] MD, [facility] 05/11/2020 12:35 PM

## 2020-05-12 DIAGNOSIS — N1832 Chronic kidney disease, stage 3b: Secondary | ICD-10-CM | POA: Diagnosis not present

## 2020-05-12 DIAGNOSIS — I129 Hypertensive chronic kidney disease with stage 1 through stage 4 chronic kidney disease, or unspecified chronic kidney disease: Secondary | ICD-10-CM | POA: Diagnosis not present

## 2020-05-12 DIAGNOSIS — Z950 Presence of cardiac pacemaker: Secondary | ICD-10-CM | POA: Diagnosis not present

## 2020-05-12 DIAGNOSIS — I509 Heart failure, unspecified: Secondary | ICD-10-CM | POA: Diagnosis not present

## 2020-05-12 DIAGNOSIS — I4891 Unspecified atrial fibrillation: Secondary | ICD-10-CM | POA: Diagnosis not present

## 2020-05-12 DIAGNOSIS — I701 Atherosclerosis of renal artery: Secondary | ICD-10-CM | POA: Diagnosis not present

## 2020-05-12 LAB — CUP PACEART REMOTE DEVICE CHECK
Battery Remaining Longevity: 2 mo
Battery Voltage: 2.7 V
Brady Statistic AP VP Percent: 99.96 %
Brady Statistic AP VS Percent: 0 %
Brady Statistic AS VP Percent: 0.04 %
Brady Statistic AS VS Percent: 0 %
Brady Statistic RA Percent Paced: 99.96 %
Brady Statistic RV Percent Paced: 100 %
Date Time Interrogation Session: 20210527004948
Implantable Lead Implant Date: 20180621
Implantable Lead Implant Date: 20180621
Implantable Lead Location: 753859
Implantable Lead Location: 753860
Implantable Lead Model: 3830
Implantable Lead Model: 5076
Implantable Pulse Generator Implant Date: 20180621
Lead Channel Impedance Value: 304 Ohm
Lead Channel Impedance Value: 304 Ohm
Lead Channel Impedance Value: 418 Ohm
Lead Channel Impedance Value: 418 Ohm
Lead Channel Pacing Threshold Amplitude: 1 V
Lead Channel Pacing Threshold Pulse Width: 0.4 ms
Lead Channel Sensing Intrinsic Amplitude: 2.625 mV
Lead Channel Sensing Intrinsic Amplitude: 2.625 mV
Lead Channel Sensing Intrinsic Amplitude: 3.625 mV
Lead Channel Sensing Intrinsic Amplitude: 3.625 mV
Lead Channel Setting Pacing Amplitude: 1.5 V
Lead Channel Setting Pacing Amplitude: 5 V
Lead Channel Setting Pacing Pulse Width: 1 ms
Lead Channel Setting Sensing Sensitivity: 0.9 mV

## 2020-05-12 LAB — CBC WITH DIFFERENTIAL/PLATELET
Basophils Absolute: 0.1 10*3/uL (ref 0.0–0.2)
Basos: 1 %
EOS (ABSOLUTE): 0.2 10*3/uL (ref 0.0–0.4)
Eos: 3 %
Hematocrit: 36.6 % (ref 34.0–46.6)
Hemoglobin: 12.6 g/dL (ref 11.1–15.9)
Immature Grans (Abs): 0.1 10*3/uL (ref 0.0–0.1)
Immature Granulocytes: 1 %
Lymphocytes Absolute: 2.1 10*3/uL (ref 0.7–3.1)
Lymphs: 29 %
MCH: 29.4 pg (ref 26.6–33.0)
MCHC: 34.4 g/dL (ref 31.5–35.7)
MCV: 86 fL (ref 79–97)
Monocytes Absolute: 0.7 10*3/uL (ref 0.1–0.9)
Monocytes: 10 %
Neutrophils Absolute: 4.1 10*3/uL (ref 1.4–7.0)
Neutrophils: 56 %
Platelets: 286 10*3/uL (ref 150–450)
RBC: 4.28 x10E6/uL (ref 3.77–5.28)
RDW: 13.7 % (ref 11.7–15.4)
WBC: 7.2 10*3/uL (ref 3.4–10.8)

## 2020-05-12 LAB — BASIC METABOLIC PANEL
BUN/Creatinine Ratio: 16 (ref 12–28)
BUN: 24 mg/dL (ref 8–27)
CO2: 23 mmol/L (ref 20–29)
Calcium: 9.3 mg/dL (ref 8.7–10.3)
Chloride: 98 mmol/L (ref 96–106)
Creatinine, Ser: 1.5 mg/dL — ABNORMAL HIGH (ref 0.57–1.00)
GFR calc Af Amer: 36 mL/min/{1.73_m2} — ABNORMAL LOW (ref >59)
GFR calc non Af Amer: 32 mL/min/{1.73_m2} — ABNORMAL LOW (ref >59)
Glucose: 82 mg/dL (ref 65–99)
Potassium: 5 mmol/L (ref 3.5–5.2)
Sodium: 136 mmol/L (ref 134–144)

## 2020-05-12 NOTE — Progress Notes (Signed)
Remote pacemaker transmission.   

## 2020-05-12 NOTE — Addendum Note (Signed)
Addended by: Cheri Kearns A on: 05/12/2020 10:40 AM   Modules accepted: Level of Service

## 2020-05-26 DIAGNOSIS — S32020A Wedge compression fracture of second lumbar vertebra, initial encounter for closed fracture: Secondary | ICD-10-CM | POA: Diagnosis not present

## 2020-06-02 ENCOUNTER — Other Ambulatory Visit: Payer: Self-pay

## 2020-06-02 ENCOUNTER — Ambulatory Visit (HOSPITAL_COMMUNITY): Payer: PPO | Attending: Cardiovascular Disease

## 2020-06-02 DIAGNOSIS — I5032 Chronic diastolic (congestive) heart failure: Secondary | ICD-10-CM

## 2020-06-03 ENCOUNTER — Other Ambulatory Visit (HOSPITAL_COMMUNITY): Payer: Self-pay | Admitting: Nurse Practitioner

## 2020-06-03 ENCOUNTER — Other Ambulatory Visit: Payer: Self-pay | Admitting: Internal Medicine

## 2020-06-03 NOTE — Telephone Encounter (Signed)
  Prescription already sent this morning.

## 2020-06-03 NOTE — Telephone Encounter (Signed)
Pt last saw Dr [name] 05/11/20, last labs 05/11/20 Creat 1.5, age 86, weight 53.3kg, based on specified criteria pt is on appropriate dosage of Eliquis 2.5mg  BID.  Will refill rx.

## 2020-06-03 NOTE — Telephone Encounter (Signed)
*  STAT* If patient is at the pharmacy, call can be transferred to refill team.   1. Which medications need to be refilled? (please list name of each medication and dose if known)  ELIQUIS 2.5 MG TABS tablet  2. Which pharmacy/location (including street and city if local pharmacy) is medication to be sent to? [id number] - [place], [place] - [address]  3. Do they need a 30 day or 90 day supply? 30 day supply

## 2020-06-07 ENCOUNTER — Other Ambulatory Visit (HOSPITAL_COMMUNITY): Payer: PPO

## 2020-06-09 ENCOUNTER — Ambulatory Visit (HOSPITAL_COMMUNITY)
Admission: RE | Admit: 2020-06-09 | Discharge: 2020-06-09 | Disposition: A | Discharge status: Home / Self Care | Payer: PPO | Attending: Internal Medicine | Admitting: Internal Medicine

## 2020-06-09 ENCOUNTER — Other Ambulatory Visit: Payer: Self-pay

## 2020-06-09 ENCOUNTER — Encounter (HOSPITAL_COMMUNITY)
Admission: RE | Disposition: A | Discharge status: Home / Self Care | Payer: Self-pay | Source: Home / Self Care | Attending: Internal Medicine

## 2020-06-09 DIAGNOSIS — I110 Hypertensive heart disease with heart failure: Secondary | ICD-10-CM | POA: Insufficient documentation

## 2020-06-09 DIAGNOSIS — Y831 Surgical operation with implant of artificial internal device as the cause of abnormal reaction of the patient, or of later complication, without mention of misadventure at the time of the procedure: Secondary | ICD-10-CM | POA: Insufficient documentation

## 2020-06-09 DIAGNOSIS — E079 Disorder of thyroid, unspecified: Secondary | ICD-10-CM | POA: Insufficient documentation

## 2020-06-09 DIAGNOSIS — T82110A Breakdown (mechanical) of cardiac electrode, initial encounter: Secondary | ICD-10-CM | POA: Diagnosis not present

## 2020-06-09 DIAGNOSIS — R001 Bradycardia, unspecified: Secondary | ICD-10-CM | POA: Insufficient documentation

## 2020-06-09 DIAGNOSIS — Z79890 Hormone replacement therapy (postmenopausal): Secondary | ICD-10-CM | POA: Diagnosis not present

## 2020-06-09 DIAGNOSIS — I442 Atrioventricular block, complete: Secondary | ICD-10-CM | POA: Diagnosis not present

## 2020-06-09 DIAGNOSIS — Z7901 Long term (current) use of anticoagulants: Secondary | ICD-10-CM | POA: Diagnosis not present

## 2020-06-09 DIAGNOSIS — Z79899 Other long term (current) drug therapy: Secondary | ICD-10-CM | POA: Insufficient documentation

## 2020-06-09 DIAGNOSIS — I4819 Other persistent atrial fibrillation: Secondary | ICD-10-CM | POA: Diagnosis not present

## 2020-06-09 DIAGNOSIS — M069 Rheumatoid arthritis, unspecified: Secondary | ICD-10-CM | POA: Insufficient documentation

## 2020-06-09 DIAGNOSIS — I483 Typical atrial flutter: Secondary | ICD-10-CM | POA: Diagnosis not present

## 2020-06-09 DIAGNOSIS — I5032 Chronic diastolic (congestive) heart failure: Secondary | ICD-10-CM | POA: Diagnosis not present

## 2020-06-09 HISTORY — PX: PPM GENERATOR REMOVAL: EP1234

## 2020-06-09 HISTORY — PX: LEAD REVISION/REPAIR: EP1213

## 2020-06-09 SURGERY — PPM GENERATOR REMOVAL

## 2020-06-09 MED ORDER — CEFAZOLIN SODIUM-DEXTROSE 2-4 GM/100ML-% IV SOLN
INTRAVENOUS | Status: AC
  Filled 2020-06-09: qty 100

## 2020-06-09 MED ORDER — SODIUM CHLORIDE 0.9 % IV SOLN
INTRAVENOUS | Status: AC
  Filled 2020-06-09: qty 2

## 2020-06-09 MED ORDER — LIDOCAINE HCL 1 % IJ SOLN
INTRAMUSCULAR | Status: AC
  Filled 2020-06-09: qty 60

## 2020-06-09 MED ORDER — CHLORHEXIDINE GLUCONATE 4 % EX LIQD
4.0000 "application " | Freq: Once | CUTANEOUS | Status: DC
  Filled 2020-06-09: qty 60

## 2020-06-09 MED ORDER — CEFAZOLIN SODIUM-DEXTROSE 2-4 GM/100ML-% IV SOLN: 2.0000 g | INTRAVENOUS | Status: DC

## 2020-06-09 MED ORDER — IOHEXOL 350 MG/ML SOLN
INTRAVENOUS | Status: DC | PRN
  Administered 2020-06-09: 15 mL

## 2020-06-09 MED ORDER — SODIUM CHLORIDE 0.9 % IV SOLN: INTRAVENOUS | Status: DC

## 2020-06-09 MED ORDER — HEPARIN (PORCINE) IN NACL 1000-0.9 UT/500ML-% IV SOLN
INTRAVENOUS | Status: AC
  Filled 2020-06-09: qty 500

## 2020-06-09 MED ORDER — SODIUM CHLORIDE 0.9 % IV SOLN: 80.0000 mg | INTRAVENOUS | Status: DC

## 2020-06-09 SURGICAL SUPPLY — 4 items
CABLE SURGICAL S-101-97-12 (CABLE) ×3 IMPLANT
PAD PRO RADIOLUCENT 2001M-C (PAD) ×3 IMPLANT
SHEATH 7FR PRELUDE SNAP 13 (SHEATH) ×2 IMPLANT
TRAY PACEMAKER INSERTION (PACKS) ×3 IMPLANT

## 2020-06-09 NOTE — Progress Notes (Signed)
Patient was not able to have procedure. Patient son was called to pick her up.

## 2020-06-09 NOTE — Interval H&P Note (Signed)
History and Physical Interval Note:  06/09/2020 3:46 PM  Brandi Chambers  has presented today for surgery, with the diagnosis of eri - lead failure.  The various methods of treatment have been discussed with the patient and family. After consideration of risks, benefits and other options for treatment, the patient has consented to  Procedure(s): PPM GENERATOR REMOVAL (N/A) LEAD REVISION/REPAIR (N/A) as a surgical intervention.  The patient's history has been reviewed, patient examined, no change in status, stable for surgery.  I have reviewed the patient's chart and labs.  Questions were answered to the patient's satisfaction.    She has high threshold from her his bundle pacing lead with early battery depletion.  I would therefore advise RV lead revision at this Risks, benefits, alternatives to pacemaker system revision with new RV lead placement were discussed in detail with the patient today. The patient understands that the risks include but are not limited to bleeding, infection, pneumothorax, perforation, tamponade, vascular damage, renal failure, MI, stroke, death,  and lead dislodgement and wishes to proceed.     [name]

## 2020-06-10 ENCOUNTER — Encounter (HOSPITAL_COMMUNITY): Payer: Self-pay | Admitting: Internal Medicine

## 2020-06-14 ENCOUNTER — Ambulatory Visit (INDEPENDENT_AMBULATORY_CARE_PROVIDER_SITE_OTHER): Payer: PPO | Admitting: *Deleted

## 2020-06-14 DIAGNOSIS — I442 Atrioventricular block, complete: Secondary | ICD-10-CM

## 2020-06-14 LAB — CUP PACEART REMOTE DEVICE CHECK
Battery Remaining Longevity: 1 mo — CL
Battery Voltage: 2.63 V
Brady Statistic RA Percent Paced: 98.45 %
Brady Statistic RV Percent Paced: 100 %
Date Time Interrogation Session: 20210629004714
Implantable Lead Implant Date: 20180621
Implantable Lead Implant Date: 20180621
Implantable Lead Location: 753859
Implantable Lead Location: 753860
Implantable Lead Model: 3830
Implantable Lead Model: 5076
Implantable Pulse Generator Implant Date: 20180621
Lead Channel Impedance Value: 285 Ohm
Lead Channel Impedance Value: 285 Ohm
Lead Channel Impedance Value: 380 Ohm
Lead Channel Impedance Value: 380 Ohm
Lead Channel Pacing Threshold Amplitude: 1 V
Lead Channel Pacing Threshold Pulse Width: 0.4 ms
Lead Channel Sensing Intrinsic Amplitude: 2 mV
Lead Channel Sensing Intrinsic Amplitude: 2 mV
Lead Channel Sensing Intrinsic Amplitude: 2.625 mV
Lead Channel Sensing Intrinsic Amplitude: 2.625 mV
Lead Channel Setting Pacing Amplitude: 2 V
Lead Channel Setting Pacing Amplitude: 5 V
Lead Channel Setting Pacing Pulse Width: 1 ms
Lead Channel Setting Sensing Sensitivity: 0.9 mV

## 2020-06-16 NOTE — Progress Notes (Signed)
Remote pacemaker transmission.   

## 2020-06-17 ENCOUNTER — Other Ambulatory Visit: Payer: Self-pay

## 2020-06-17 ENCOUNTER — Ambulatory Visit: Payer: PPO | Admitting: Internal Medicine

## 2020-06-17 ENCOUNTER — Encounter: Payer: Self-pay | Admitting: Internal Medicine

## 2020-06-17 VITALS — BP 152/86 | HR 89 | Ht 62.0 in | Wt 115.8 lb

## 2020-06-17 DIAGNOSIS — I4819 Other persistent atrial fibrillation: Secondary | ICD-10-CM | POA: Diagnosis not present

## 2020-06-17 DIAGNOSIS — Z950 Presence of cardiac pacemaker: Secondary | ICD-10-CM | POA: Diagnosis not present

## 2020-06-17 DIAGNOSIS — I10 Essential (primary) hypertension: Secondary | ICD-10-CM

## 2020-06-17 DIAGNOSIS — T82110A Breakdown (mechanical) of cardiac electrode, initial encounter: Secondary | ICD-10-CM | POA: Diagnosis not present

## 2020-06-17 DIAGNOSIS — I495 Sick sinus syndrome: Secondary | ICD-10-CM | POA: Diagnosis not present

## 2020-06-17 LAB — BASIC METABOLIC PANEL
BUN/Creatinine Ratio: 19 (ref 12–28)
BUN: 31 mg/dL — ABNORMAL HIGH (ref 8–27)
CO2: 25 mmol/L (ref 20–29)
Calcium: 9.4 mg/dL (ref 8.7–10.3)
Chloride: 97 mmol/L (ref 96–106)
Creatinine, Ser: 1.67 mg/dL — ABNORMAL HIGH (ref 0.57–1.00)
GFR calc Af Amer: 32 mL/min/{1.73_m2} — ABNORMAL LOW (ref >59)
GFR calc non Af Amer: 28 mL/min/{1.73_m2} — ABNORMAL LOW (ref >59)
Glucose: 92 mg/dL (ref 65–99)
Potassium: 4.6 mmol/L (ref 3.5–5.2)
Sodium: 136 mmol/L (ref 134–144)

## 2020-06-17 LAB — CBC WITH DIFFERENTIAL/PLATELET
Basophils Absolute: 0.1 10*3/uL (ref 0.0–0.2)
Basos: 1 %
EOS (ABSOLUTE): 0.3 10*3/uL (ref 0.0–0.4)
Eos: 4 %
Hematocrit: 36.2 % (ref 34.0–46.6)
Hemoglobin: 12.1 g/dL (ref 11.1–15.9)
Immature Grans (Abs): 0.1 10*3/uL (ref 0.0–0.1)
Immature Granulocytes: 1 %
Lymphocytes Absolute: 1.7 10*3/uL (ref 0.7–3.1)
Lymphs: 22 %
MCH: 28.7 pg (ref 26.6–33.0)
MCHC: 33.4 g/dL (ref 31.5–35.7)
MCV: 86 fL (ref 79–97)
Monocytes Absolute: 0.7 10*3/uL (ref 0.1–0.9)
Monocytes: 9 %
Neutrophils Absolute: 4.9 10*3/uL (ref 1.4–7.0)
Neutrophils: 63 %
Platelets: 247 10*3/uL (ref 150–450)
RBC: 4.22 x10E6/uL (ref 3.77–5.28)
RDW: 14.1 % (ref 11.7–15.4)
WBC: 7.7 10*3/uL (ref 3.4–10.8)

## 2020-06-17 NOTE — Patient Instructions (Signed)
Medication Instructions:  Your physician recommends that you continue on your current medications as directed. Please refer to the Current Medication list given to you today.  Labwork: You will get lab work today:  BMP and CBC  Testing/Procedures: None ordered.  Follow-Up:  SEE INSTRUCTION LETTER  Any Other Special Instructions Will Be Listed Below (If Applicable).  If you need a refill on your cardiac medications before your next appointment, please call your pharmacy.   

## 2020-06-17 NOTE — H&P (View-Only) (Signed)
HPI Brandi Chambers is referred today for evaluation of a malfunctioning lead. She is a pleasant 86 yo woman with CHB, s/p PPM insertion. Her RV/His bundle lead has developed worsening thresholds. She underwent a planned lead revision a couple of weeks ago but was found to have a subtotally occluded left subclavian vein. The procedure was abandoned. She presents now to discuss her options.  No Known Allergies   Current Outpatient Medications  Medication Sig Dispense Refill  . amiodarone (PACERONE) 200 MG tablet Take 100 mg by mouth daily.    Marland Kitchen amLODipine (NORVASC) 2.5 MG tablet Take 2.5 mg by mouth daily.   3  . ELIQUIS 2.5 MG TABS tablet TAKE 1 TABLET BY MOUTH TWICE A DAY 60 tablet 5  . furosemide (LASIX) 20 MG tablet Take 20 mg by mouth daily.     Marland Kitchen levothyroxine (SYNTHROID, LEVOTHROID) 75 MCG tablet Take 75 mcg by mouth daily before breakfast.    . metoprolol succinate (TOPROL-XL) 25 MG 24 hr tablet TAKE 1 TABLET BY MOUTH EVERY DAY 90 tablet 1  . nitroGLYCERIN (NITROSTAT) 0.4 MG SL tablet Place 1 tablet (0.4 mg total) under the tongue every 5 (five) minutes as needed for chest pain. 90 tablet 3   No current facility-administered medications for this visit.     Past Medical History:  Diagnosis Date  . Arthritis    RA IN HANDS  . Chronic congestive heart failure with left ventricular diastolic dysfunction ([place])   . Complete heart block ([place])   . Hypertension   . Hypoglycemia   . Nephrolithiasis   . Osteoporosis   . Persistent atrial fibrillation ([place])   . Thyroid disease   . Typical atrial flutter (HCC)     ROS:   All systems reviewed and negative except as noted in the HPI.   Past Surgical History:  Procedure Laterality Date  . APPENDECTOMY    . CARDIOVERSION N/A 05/14/2017   Procedure: CARDIOVERSION;  Surgeon: [name], MD;  Location: [place];  Service: Cardiovascular;  Laterality: N/A;  . CARDIOVERSION N/A 07/23/2018   Procedure: CARDIOVERSION;   Surgeon: [name], MD;  Location: [facility] ENDOSCOPY;  Service: Cardiovascular;  Laterality: N/A;  . LEAD REVISION/REPAIR N/A 06/09/2020   Procedure: LEAD REVISION/REPAIR;  Surgeon: [name], MD;  Location: [place] CV LAB;  Service: Cardiovascular;  Laterality: N/A;  . PACEMAKER IMPLANT N/A 06/06/2017   Procedure: Pacemaker Implant;  Surgeon: [name], MD;  Location: [place] CV LAB;  Service: Cardiovascular;  Laterality: N/A;  . PPM GENERATOR REMOVAL N/A 06/09/2020   Procedure: PPM GENERATOR REMOVAL;  Surgeon: [name], MD;  Location: [place] CV LAB;  Service: Cardiovascular;  Laterality: N/A;     Family History  Problem Relation Age of Onset  . Breast cancer Mother   . Lung cancer Father      Social History   Socioeconomic History  . Marital status: Married    Spouse name: Not on file  . Number of children: 1  . Years of education: Not on file  . Highest education level: Not on file  Occupational History  . Not on file  Tobacco Use  . Smoking status: Former Smoker    Types: Cigarettes    Quit date: 05/18/1969    Years since quitting: 51.1  . Smokeless tobacco: Never Used  Vaping Use  . Vaping Use: Never used  Substance and Sexual Activity  . Alcohol use: No  . Drug use: No  .  Sexual activity: Not on file  Other Topics Concern  . Not on file  Social History Narrative   Lives in [place] with her spouse.  Retired   Scientist, physiological Strain:   . Difficulty of Paying Living Expenses:   Food Insecurity:   . Worried About Charity fundraiser in the Last Year:   . Arboriculturist in the Last Year:   Transportation Needs:   . Film/video editor (Medical):   Marland Kitchen Lack of Transportation (Non-Medical):   Physical Activity:   . Days of Exercise per Week:   . Minutes of Exercise per Session:   Stress:   . Feeling of Stress :   Social Connections:   . Frequency of Communication with Friends and Family:   .  Frequency of Social Gatherings with Friends and Family:   . Attends Religious Services:   . Active Member of Clubs or Organizations:   . Attends Archivist Meetings:   Marland Kitchen Marital Status:   Intimate Partner Violence:   . Fear of Current or Ex-Partner:   . Emotionally Abused:   Marland Kitchen Physically Abused:   . Sexually Abused:      BP (!) 152/86   Pulse 89   Ht 5\' 2"  (1.575 m)   Wt 115 lb 12.8 oz (52.5 kg)   LMP  (LMP Unknown)   SpO2 96%   BMI 21.18 kg/m   Physical Exam:  Well appearing elderly woman, NAD HEENT: Unremarkable Neck:  6 cm JVD, no thyromegally Lymphatics:  No adenopathy Back:  No CVA tenderness Lungs:  Clear with no wheezes HEART:  Regular rate rhythm, no murmurs, no rubs, no clicks Abd:  soft, positive bowel sounds, no organomegally, no rebound, no guarding Ext:  2 plus pulses, no edema, no cyanosis, no clubbing Skin:  No rashes no nodules Neuro:  CN II through XII intact, motor grossly intact   Assess/Plan: 1. RV lead dysfunction - I have discussed the treatment options in detail. There is a possibility that we can get across the subtotal occlusion but if not, we will plan to remove her high threshold RV lead and place a new RV lead. I have reviewed the risks/benefits/goals/expectations of the procedure with the patient and she wishes to proceed. 2. Chronic systolic dysfunction - she has class 2 symptoms.  3. HTN - her bp is slightly increased. We will work on this after her revision.   Mikle Bosworth.D.

## 2020-06-17 NOTE — Progress Notes (Signed)
HPI Brandi Chambers is referred today for evaluation of a malfunctioning lead. She is a pleasant 86 yo woman with CHB, s/p PPM insertion. Her RV/His bundle lead has developed worsening thresholds. She underwent a planned lead revision a couple of weeks ago but was found to have a subtotally occluded left subclavian vein. The procedure was abandoned. She presents now to discuss her options.  No Known Allergies   Current Outpatient Medications  Medication Sig Dispense Refill  . amiodarone (PACERONE) 200 MG tablet Take 100 mg by mouth daily.    Marland Kitchen amLODipine (NORVASC) 2.5 MG tablet Take 2.5 mg by mouth daily.   3  . ELIQUIS 2.5 MG TABS tablet TAKE 1 TABLET BY MOUTH TWICE A DAY 60 tablet 5  . furosemide (LASIX) 20 MG tablet Take 20 mg by mouth daily.     Marland Kitchen levothyroxine (SYNTHROID, LEVOTHROID) 75 MCG tablet Take 75 mcg by mouth daily before breakfast.    . metoprolol succinate (TOPROL-XL) 25 MG 24 hr tablet TAKE 1 TABLET BY MOUTH EVERY DAY 90 tablet 1  . nitroGLYCERIN (NITROSTAT) 0.4 MG SL tablet Place 1 tablet (0.4 mg total) under the tongue every 5 (five) minutes as needed for chest pain. 90 tablet 3   No current facility-administered medications for this visit.     Past Medical History:  Diagnosis Date  . Arthritis    RA IN HANDS  . Chronic congestive heart failure with left ventricular diastolic dysfunction ([place])   . Complete heart block ([place])   . Hypertension   . Hypoglycemia   . Nephrolithiasis   . Osteoporosis   . Persistent atrial fibrillation ([place])   . Thyroid disease   . Typical atrial flutter (HCC)     ROS:   All systems reviewed and negative except as noted in the HPI.   Past Surgical History:  Procedure Laterality Date  . APPENDECTOMY    . CARDIOVERSION N/A 05/14/2017   Procedure: CARDIOVERSION;  Surgeon: [name], MD;  Location: [place];  Service: Cardiovascular;  Laterality: N/A;  . CARDIOVERSION N/A 07/23/2018   Procedure: CARDIOVERSION;   Surgeon: [name], MD;  Location: [facility] ENDOSCOPY;  Service: Cardiovascular;  Laterality: N/A;  . LEAD REVISION/REPAIR N/A 06/09/2020   Procedure: LEAD REVISION/REPAIR;  Surgeon: [name], MD;  Location: [place] CV LAB;  Service: Cardiovascular;  Laterality: N/A;  . PACEMAKER IMPLANT N/A 06/06/2017   Procedure: Pacemaker Implant;  Surgeon: [name], MD;  Location: [place] CV LAB;  Service: Cardiovascular;  Laterality: N/A;  . PPM GENERATOR REMOVAL N/A 06/09/2020   Procedure: PPM GENERATOR REMOVAL;  Surgeon: [name], MD;  Location: [place] CV LAB;  Service: Cardiovascular;  Laterality: N/A;     Family History  Problem Relation Age of Onset  . Breast cancer Mother   . Lung cancer Father      Social History   Socioeconomic History  . Marital status: Married    Spouse name: Not on file  . Number of children: 1  . Years of education: Not on file  . Highest education level: Not on file  Occupational History  . Not on file  Tobacco Use  . Smoking status: Former Smoker    Types: Cigarettes    Quit date: 05/18/1969    Years since quitting: 51.1  . Smokeless tobacco: Never Used  Vaping Use  . Vaping Use: Never used  Substance and Sexual Activity  . Alcohol use: No  . Drug use: No  .  Sexual activity: Not on file  Other Topics Concern  . Not on file  Social History Narrative   Lives in [place] with her spouse.  Retired   Scientist, physiological Strain:   . Difficulty of Paying Living Expenses:   Food Insecurity:   . Worried About Charity fundraiser in the Last Year:   . Arboriculturist in the Last Year:   Transportation Needs:   . Film/video editor (Medical):   Marland Kitchen Lack of Transportation (Non-Medical):   Physical Activity:   . Days of Exercise per Week:   . Minutes of Exercise per Session:   Stress:   . Feeling of Stress :   Social Connections:   . Frequency of Communication with Friends and Family:   .  Frequency of Social Gatherings with Friends and Family:   . Attends Religious Services:   . Active Member of Clubs or Organizations:   . Attends Archivist Meetings:   Marland Kitchen Marital Status:   Intimate Partner Violence:   . Fear of Current or Ex-Partner:   . Emotionally Abused:   Marland Kitchen Physically Abused:   . Sexually Abused:      BP (!) 152/86   Pulse 89   Ht 5\' 2"  (1.575 m)   Wt 115 lb 12.8 oz (52.5 kg)   LMP  (LMP Unknown)   SpO2 96%   BMI 21.18 kg/m   Physical Exam:  Well appearing elderly woman, NAD HEENT: Unremarkable Neck:  6 cm JVD, no thyromegally Lymphatics:  No adenopathy Back:  No CVA tenderness Lungs:  Clear with no wheezes HEART:  Regular rate rhythm, no murmurs, no rubs, no clicks Abd:  soft, positive bowel sounds, no organomegally, no rebound, no guarding Ext:  2 plus pulses, no edema, no cyanosis, no clubbing Skin:  No rashes no nodules Neuro:  CN II through XII intact, motor grossly intact   Assess/Plan: 1. RV lead dysfunction - I have discussed the treatment options in detail. There is a possibility that we can get across the subtotal occlusion but if not, we will plan to remove her high threshold RV lead and place a new RV lead. I have reviewed the risks/benefits/goals/expectations of the procedure with the patient and she wishes to proceed. 2. Chronic systolic dysfunction - she has class 2 symptoms.  3. HTN - her bp is slightly increased. We will work on this after her revision.   Mikle Bosworth.D.

## 2020-06-21 ENCOUNTER — Telehealth: Payer: Self-pay | Admitting: *Deleted

## 2020-06-21 ENCOUNTER — Telehealth: Payer: Self-pay

## 2020-06-21 ENCOUNTER — Ambulatory Visit: Payer: PPO

## 2020-06-21 DIAGNOSIS — L905 Scar conditions and fibrosis of skin: Secondary | ICD-10-CM | POA: Diagnosis not present

## 2020-06-21 DIAGNOSIS — Z85828 Personal history of other malignant neoplasm of skin: Secondary | ICD-10-CM | POA: Diagnosis not present

## 2020-06-21 DIAGNOSIS — L570 Actinic keratosis: Secondary | ICD-10-CM | POA: Diagnosis not present

## 2020-06-21 NOTE — Telephone Encounter (Signed)
Call placed to Pt.  Pt is scheduled for pacemaker system revision on June 28, 2020 at 3:00 pm with Dr. Mammie Lorenzo test scheduled for June 24, 2020   Please arrive at the [facility] main entrance of [facility] hospital at:  1:00 pm on June 28, 2020  You may have a light breakfast before 7:00 am on June 28, 2020.  Do not EAT after 7:00 am.  On the morning of your procedure take your metoprolol and synthroid ONLY with a sip of water HOLD Eliquis for 2 days prior-last dose June 25, 2020 PM dose  Plan for one night stay You will need someone to drive you home at discharge  Work up complete

## 2020-06-21 NOTE — Telephone Encounter (Signed)
PPM alert received for RRT as of 06/20/20 with subsequent reversion to VVI 65bpm. 4 treated AT/A-flutter episodes and 1 monitored AT episode (1.7% burden), longest 1hr 66min. Known AF, on apixaban, amiodarone, and metoprolol. Already scheduled for PPM system revision on 06/28/20.   Spoke with pt to advise of RRT and reversion to VVI 65. Pt verbalizes understanding and reports she has been feeling fine so far. She agrees to call with any fluid retention symptoms. She denies questions at this time.  Routed to Dr. Lovena Le as Juluis Rainier in advance of upcoming procedure.

## 2020-06-22 LAB — CUP PACEART INCLINIC DEVICE CHECK
Battery Remaining Longevity: 1 mo
Battery Voltage: 2.7 V
Brady Statistic AP VP Percent: 99.88 %
Brady Statistic AP VS Percent: 0 %
Brady Statistic AS VP Percent: 0.12 %
Brady Statistic AS VS Percent: 0 %
Brady Statistic RA Percent Paced: 99.88 %
Brady Statistic RV Percent Paced: 100 %
Date Time Interrogation Session: 20210526131205
Implantable Lead Implant Date: 20180621
Implantable Lead Implant Date: 20180621
Implantable Lead Location: 753859
Implantable Lead Location: 753860
Implantable Lead Model: 3830
Implantable Lead Model: 5076
Implantable Pulse Generator Implant Date: 20180621
Lead Channel Impedance Value: 304 Ohm
Lead Channel Impedance Value: 304 Ohm
Lead Channel Impedance Value: 418 Ohm
Lead Channel Impedance Value: 418 Ohm
Lead Channel Pacing Threshold Amplitude: 1 V
Lead Channel Pacing Threshold Amplitude: 3.75 V
Lead Channel Pacing Threshold Pulse Width: 0.4 ms
Lead Channel Pacing Threshold Pulse Width: 1 ms
Lead Channel Sensing Intrinsic Amplitude: 2.625 mV
Lead Channel Sensing Intrinsic Amplitude: 3.625 mV
Lead Channel Setting Pacing Amplitude: 1.5 V
Lead Channel Setting Pacing Amplitude: 5 V
Lead Channel Setting Pacing Pulse Width: 1 ms
Lead Channel Setting Sensing Sensitivity: 0.9 mV

## 2020-06-23 ENCOUNTER — Encounter: Payer: Self-pay | Admitting: *Deleted

## 2020-06-24 ENCOUNTER — Other Ambulatory Visit (HOSPITAL_COMMUNITY)
Admission: RE | Admit: 2020-06-24 | Discharge: 2020-06-24 | Disposition: A | Discharge status: Home / Self Care | Payer: PPO | Source: Ambulatory Visit | Attending: Internal Medicine | Admitting: Internal Medicine

## 2020-06-24 DIAGNOSIS — Z01812 Encounter for preprocedural laboratory examination: Secondary | ICD-10-CM | POA: Insufficient documentation

## 2020-06-24 DIAGNOSIS — Z20822 Contact with and (suspected) exposure to covid-19: Secondary | ICD-10-CM | POA: Insufficient documentation

## 2020-06-24 LAB — SARS CORONAVIRUS 2 (TAT 6-24 HRS): SARS Coronavirus 2: NEGATIVE

## 2020-06-27 ENCOUNTER — Other Ambulatory Visit: Payer: Self-pay

## 2020-06-27 ENCOUNTER — Encounter (HOSPITAL_COMMUNITY): Payer: Self-pay | Admitting: Internal Medicine

## 2020-06-27 ENCOUNTER — Telehealth: Payer: Self-pay

## 2020-06-27 NOTE — Progress Notes (Signed)
Patient states she will here as close to 1030 as possible.

## 2020-06-27 NOTE — Telephone Encounter (Signed)
Call placed to Pt.  Advised Pt should arrive for procedure 06/28/20 at 11:00 am.  Advised no breakfast with time change  Pt indicates understanding.

## 2020-06-27 NOTE — Anesthesia Preprocedure Evaluation (Addendum)
Anesthesia Evaluation   Patient awake    Reviewed: Allergy & Precautions, NPO status , Patient's Chart, lab work & pertinent test results  Airway Mallampati: I  TM Distance: >3 FB Neck ROM: Full    Dental   Pulmonary former smoker,    Pulmonary exam normal        Cardiovascular hypertension, Pt. on medications Normal cardiovascular exam+ dysrhythmias Atrial Fibrillation + pacemaker      Neuro/Psych    GI/Hepatic   Endo/Other    Renal/GU      Musculoskeletal   Abdominal   Peds  Hematology   Anesthesia Other Findings   Reproductive/Obstetrics                             Anesthesia Physical Anesthesia Plan  ASA: III  Anesthesia Plan: General   Post-op Pain Management:    Induction:   PONV Risk Score and Plan: 3  Airway Management Planned: Oral ETT  Additional Equipment:   Intra-op Plan:   Post-operative Plan: Extubation in OR  Informed Consent: I have reviewed the patients History and Physical, chart, labs and discussed the procedure including the risks, benefits and alternatives for the proposed anesthesia with the patient or authorized representative who has indicated his/her understanding and acceptance.       Plan Discussed with: CRNA and Surgeon  Anesthesia Plan Comments: (PAT note written 06/27/2020 by Myra Gianotti, PA-C.  )       Anesthesia Quick Evaluation

## 2020-06-27 NOTE — Progress Notes (Addendum)
Made preadmission phone call to Brandi Chambers.  Patient stated that per Dr. Tanna Furry instruction her last dose of Eliquis was Saturday 06/25/20.  Dr. Tanna Furry office instructed her the only meds to take on day of surgery is the metoprolol and the synthroid.  Dr. Tanna Furry office also instructed her that she could have solids and liquids until 0700 am day of surgery.  Informed [name] to continue to quarantine and to arrive at our admitting office on 06/28/20 Tuesday at 1230pm.    Her PCP is Dr. Dagmar Hait Her EP is Dr. [name]  Anesthesia to review d/t her cardiac hx

## 2020-06-27 NOTE — Progress Notes (Addendum)
Anesthesia Chart Review: SAME DAY WORK-UP   Case: [id number] Date/Time: 06/28/20 1300   Procedure: PACEMAKER SYSTEM REVISION (N/A ) - BARTLE BACK UP   Anesthesia type: General   Pre-op diagnosis:      ERI AND LEAD FAILURE     ERI AND LEAD FAILURE   Location: [place] CATH LAB 6 / [place] INVASIVE CV LAB   Providers: Evans Lance, MD      DISCUSSION: Patient is an 86 year old female scheduled for the above procedure. She is s/p Medtronic PPM 06/06/17. By notes, Her RV/His bundle lead has developed worsening thresholds. She was for planned lead revision by Dr. [name] on 06/09/20, but was found to have subtotally occluded left subclavian vein, so was referred to Dr. Lovena Le who notes, "There is a possibility that we can get across the subtotal occlusion but if not, we will plan to remove her high threshold RV lead and place a new RV lead."   History includes former smoker, HTN, chronic systolic CHF, atrial fib/flutter (s/p DCCV 05/14/17), complete heart block (s/p dual chamber Medtronic PPM 06/06/17), hypothyroidism, CKD (stage III), RA.  Reported instructions to hold Eliquis after 06/25/20 PM dose. Per EP cardiology instructions, she "may have a light breakfast before 7:00 am on June 28, 2020.  Do not EAT after 7:00 am."   06/24/2020 presurgical COVID-19 test negative.  Anesthesia team to evaluate on the day of surgery. She had CBC and BMET on 06/17/20. She is for T&S/Prepare PRBC on arrival.     VS:   Wt Readings from Last 3 Encounters:  06/17/20 52.5 kg  06/09/20 53.1 kg  05/11/20 53.3 kg   Temp Readings from Last 3 Encounters:  06/09/20 36.4 C (Oral)  07/23/18 36.7 C (Oral)  06/07/17 36.6 C (Oral)   BP Readings from Last 3 Encounters:  06/17/20 (!) 152/86  06/09/20 (!) 167/78  05/11/20 128/80   Pulse Readings from Last 3 Encounters:  06/17/20 89  06/09/20 74  05/11/20 82    PROVIDERS: [name], MD is PCP Research officer, political party). 01/15/20 telemedicine note is scanned under  Media tab.  [name], MD is EP cardiologist [place], Jeneen Rinks, MD is nephrologist    LABS: Lab results as of 06/17/20 include: Lab Results  Component Value Date   WBC 7.7 06/17/2020   HGB 12.1 06/17/2020   HCT 36.2 06/17/2020   PLT 247 06/17/2020   GLUCOSE 92 06/17/2020   ALT 19 07/18/2018   AST 30 07/18/2018   NA 136 06/17/2020   K 4.6 06/17/2020   CL 97 06/17/2020   CREATININE 1.67 (H) 06/17/2020   BUN 31 (H) 06/17/2020   CO2 25 06/17/2020    Lab Results  Component Value Date   CREATININE 1.67 (H) 06/17/2020   CREATININE 1.50 (H) 05/11/2020   CREATININE 1.90 (H) 07/18/2018     EKG: 05/11/20: Sinus with His bundle pacing per Dr. [name]   CV: Echo 06/02/20: IMPRESSIONS  1. Left ventricular ejection fraction, by estimation, is 55 to 60%. The  left ventricle has normal function. The left ventricle has no regional  wall motion abnormalities. Left ventricular diastolic function could not  be evaluated.  2. Right ventricular systolic function is normal. The right ventricular  size is mildly enlarged. There is mildly elevated pulmonary artery  systolic pressure. The estimated right ventricular systolic pressure is  54.2 mmHg.  3. Left atrial size was severely dilated.  4. Right atrial size was mildly dilated.  5. The mitral valve is grossly normal.  Mild to moderate mitral valve  regurgitation. No evidence of mitral stenosis.  6. Tricuspid valve regurgitation is mild to moderate.  7. The aortic valve is tricuspid. Aortic valve regurgitation is mild.  Mild aortic valve sclerosis is present, with no evidence of aortic valve  stenosis.  8. The inferior vena cava is normal in size with greater than 50%  respiratory variability, suggesting right atrial pressure of 3 mmHg.  - Comparison(s): EF now 55-60%. Improved from prior study. [LVEF 45-50% 04/26/17]   Nuclear stress test 10/08/18:  Nuclear stress EF: 80%.  There was no ST segment deviation noted during  stress.  The study is normal.  This is a low risk study.  The left ventricular ejection fraction is hyperdynamic (>65%).    Past Medical History:  Diagnosis Date  . Arthritis    RA IN HANDS  . Chronic congestive heart failure with left ventricular diastolic dysfunction ([place])   . CKD (chronic kidney disease)   . Complete heart block ([place])   . Hypertension   . Hypoglycemia   . Hypothyroidism   . Nephrolithiasis   . Osteoporosis   . Persistent atrial fibrillation ([place])   . Presence of permanent cardiac pacemaker   . Thyroid disease   . Typical atrial flutter [facility])     Past Surgical History:  Procedure Laterality Date  . APPENDECTOMY    . CARDIOVERSION N/A 05/14/2017   Procedure: CARDIOVERSION;  Surgeon: [name], MD;  Location: [place];  Service: Cardiovascular;  Laterality: N/A;  . CARDIOVERSION N/A 07/23/2018   Procedure: CARDIOVERSION;  Surgeon: [name], MD;  Location: [facility] ENDOSCOPY;  Service: Cardiovascular;  Laterality: N/A;  . INSERT / REPLACE / REMOVE PACEMAKER    . LEAD REVISION/REPAIR N/A 06/09/2020   Procedure: LEAD REVISION/REPAIR;  Surgeon: [name], MD;  Location: [place] CV LAB;  Service: Cardiovascular;  Laterality: N/A;  . PACEMAKER IMPLANT N/A 06/06/2017   Procedure: Pacemaker Implant;  Surgeon: [name], MD;  Location: [place] CV LAB;  Service: Cardiovascular;  Laterality: N/A;  . PPM GENERATOR REMOVAL N/A 06/09/2020   Procedure: PPM GENERATOR REMOVAL;  Surgeon: [name], MD;  Location: [place] CV LAB;  Service: Cardiovascular;  Laterality: N/A;    MEDICATIONS: No current facility-administered medications for this encounter.   Marland Kitchen amiodarone (PACERONE) 200 MG tablet  . amLODipine (NORVASC) 2.5 MG tablet  . ELIQUIS 2.5 MG TABS tablet  . furosemide (LASIX) 20 MG tablet  . levothyroxine (SYNTHROID, LEVOTHROID) 75 MCG tablet  . metoprolol succinate (TOPROL-XL) 25 MG 24 hr tablet  . nitroGLYCERIN (NITROSTAT) 0.4  MG SL tablet    Myra Gianotti, PA-C Surgical Short Stay/Anesthesiology [facility]: [place] [address] Phone [phone] [facility] Phone [phone] 06/27/2020 5:03 PM

## 2020-06-28 ENCOUNTER — Inpatient Hospital Stay (HOSPITAL_COMMUNITY)
Admission: RE | Admit: 2020-06-28 | Discharge: 2020-06-29 | DRG: 243 | Disposition: A | Discharge status: Home / Self Care | Payer: PPO | Attending: Internal Medicine | Admitting: Internal Medicine

## 2020-06-28 ENCOUNTER — Encounter (HOSPITAL_COMMUNITY): Payer: Self-pay | Admitting: Internal Medicine

## 2020-06-28 ENCOUNTER — Inpatient Hospital Stay (HOSPITAL_COMMUNITY)
Admission: RE | Disposition: A | Discharge status: Home / Self Care | Payer: Self-pay | Source: Home / Self Care | Attending: Internal Medicine

## 2020-06-28 ENCOUNTER — Inpatient Hospital Stay (HOSPITAL_COMMUNITY): Payer: PPO | Admitting: Vascular Surgery

## 2020-06-28 ENCOUNTER — Other Ambulatory Visit: Payer: Self-pay

## 2020-06-28 DIAGNOSIS — Y710 Diagnostic and monitoring cardiovascular devices associated with adverse incidents: Secondary | ICD-10-CM | POA: Diagnosis present

## 2020-06-28 DIAGNOSIS — E162 Hypoglycemia, unspecified: Secondary | ICD-10-CM | POA: Diagnosis not present

## 2020-06-28 DIAGNOSIS — I517 Cardiomegaly: Secondary | ICD-10-CM | POA: Diagnosis not present

## 2020-06-28 DIAGNOSIS — I130 Hypertensive heart and chronic kidney disease with heart failure and stage 1 through stage 4 chronic kidney disease, or unspecified chronic kidney disease: Secondary | ICD-10-CM | POA: Diagnosis present

## 2020-06-28 DIAGNOSIS — I4819 Other persistent atrial fibrillation: Secondary | ICD-10-CM | POA: Diagnosis not present

## 2020-06-28 DIAGNOSIS — I82B12 Acute embolism and thrombosis of left subclavian vein: Secondary | ICD-10-CM | POA: Diagnosis present

## 2020-06-28 DIAGNOSIS — Z79890 Hormone replacement therapy (postmenopausal): Secondary | ICD-10-CM | POA: Diagnosis not present

## 2020-06-28 DIAGNOSIS — Z7901 Long term (current) use of anticoagulants: Secondary | ICD-10-CM

## 2020-06-28 DIAGNOSIS — Z79899 Other long term (current) drug therapy: Secondary | ICD-10-CM | POA: Diagnosis not present

## 2020-06-28 DIAGNOSIS — Z87891 Personal history of nicotine dependence: Secondary | ICD-10-CM

## 2020-06-28 DIAGNOSIS — I442 Atrioventricular block, complete: Secondary | ICD-10-CM | POA: Diagnosis not present

## 2020-06-28 DIAGNOSIS — N183 Chronic kidney disease, stage 3 unspecified: Secondary | ICD-10-CM | POA: Diagnosis not present

## 2020-06-28 DIAGNOSIS — M810 Age-related osteoporosis without current pathological fracture: Secondary | ICD-10-CM | POA: Diagnosis not present

## 2020-06-28 DIAGNOSIS — T82110A Breakdown (mechanical) of cardiac electrode, initial encounter: Secondary | ICD-10-CM | POA: Diagnosis present

## 2020-06-28 DIAGNOSIS — T82110S Breakdown (mechanical) of cardiac electrode, sequela: Secondary | ICD-10-CM | POA: Diagnosis not present

## 2020-06-28 DIAGNOSIS — I5022 Chronic systolic (congestive) heart failure: Secondary | ICD-10-CM | POA: Diagnosis not present

## 2020-06-28 DIAGNOSIS — T82897S Other specified complication of cardiac prosthetic devices, implants and grafts, sequela: Secondary | ICD-10-CM | POA: Diagnosis not present

## 2020-06-28 DIAGNOSIS — Z20822 Contact with and (suspected) exposure to covid-19: Secondary | ICD-10-CM | POA: Diagnosis present

## 2020-06-28 DIAGNOSIS — T82897A Other specified complication of cardiac prosthetic devices, implants and grafts, initial encounter: Principal | ICD-10-CM | POA: Diagnosis present

## 2020-06-28 DIAGNOSIS — Z87442 Personal history of urinary calculi: Secondary | ICD-10-CM

## 2020-06-28 DIAGNOSIS — Z950 Presence of cardiac pacemaker: Secondary | ICD-10-CM | POA: Diagnosis not present

## 2020-06-28 DIAGNOSIS — E039 Hypothyroidism, unspecified: Secondary | ICD-10-CM | POA: Diagnosis present

## 2020-06-28 DIAGNOSIS — I471 Supraventricular tachycardia: Secondary | ICD-10-CM | POA: Diagnosis not present

## 2020-06-28 DIAGNOSIS — T82190A Other mechanical complication of cardiac electrode, initial encounter: Secondary | ICD-10-CM | POA: Diagnosis not present

## 2020-06-28 DIAGNOSIS — I871 Compression of vein: Secondary | ICD-10-CM | POA: Diagnosis present

## 2020-06-28 HISTORY — DX: Presence of cardiac pacemaker: Z95.0

## 2020-06-28 HISTORY — DX: Chronic kidney disease, unspecified: N18.9

## 2020-06-28 HISTORY — DX: Hypothyroidism, unspecified: E03.9

## 2020-06-28 HISTORY — PX: PACEMAKER REVISION: EP1221

## 2020-06-28 LAB — ABO/RH: ABO/RH(D): O NEG

## 2020-06-28 LAB — SURGICAL PCR SCREEN
MRSA, PCR: NEGATIVE
Staphylococcus aureus: NEGATIVE

## 2020-06-28 LAB — PREPARE RBC (CROSSMATCH)

## 2020-06-28 SURGERY — PACEMAKER REVISION
Anesthesia: General

## 2020-06-28 MED ORDER — SODIUM CHLORIDE 0.9 % IV SOLN: INTRAVENOUS | Status: DC

## 2020-06-28 MED ORDER — CEFAZOLIN SODIUM-DEXTROSE 2-4 GM/100ML-% IV SOLN
INTRAVENOUS | Status: AC
  Filled 2020-06-28: qty 100

## 2020-06-28 MED ORDER — DEXAMETHASONE SODIUM PHOSPHATE 10 MG/ML IJ SOLN
INTRAMUSCULAR | Status: DC | PRN
  Administered 2020-06-28: 5 mg via INTRAVENOUS

## 2020-06-28 MED ORDER — SODIUM CHLORIDE 0.9 % IV SOLN
80.0000 mg | INTRAVENOUS | Status: AC
  Administered 2020-06-28: 80 mg

## 2020-06-28 MED ORDER — PHENYLEPHRINE HCL-NACL 10-0.9 MG/250ML-% IV SOLN
INTRAVENOUS | Status: DC | PRN
Start: 2020-06-28 — End: 2020-06-28
  Administered 2020-06-28: 30 ug/min via INTRAVENOUS

## 2020-06-28 MED ORDER — FENTANYL CITRATE (PF) 250 MCG/5ML IJ SOLN
INTRAMUSCULAR | Status: DC | PRN
  Administered 2020-06-28: 100 ug via INTRAVENOUS

## 2020-06-28 MED ORDER — CEFAZOLIN SODIUM-DEXTROSE 1-4 GM/50ML-% IV SOLN
1.0000 g | Freq: Two times a day (BID) | INTRAVENOUS | Status: DC
  Administered 2020-06-29: 1 g via INTRAVENOUS
  Filled 2020-06-28 (×2): qty 50

## 2020-06-28 MED ORDER — LIDOCAINE HCL 1 % IJ SOLN
INTRAMUSCULAR | Status: AC
  Filled 2020-06-28: qty 60

## 2020-06-28 MED ORDER — CHLORHEXIDINE GLUCONATE 0.12 % MT SOLN
OROMUCOSAL | Status: AC
  Administered 2020-06-28: 15 mL
  Filled 2020-06-28: qty 15

## 2020-06-28 MED ORDER — NITROGLYCERIN 0.4 MG SL SUBL: 0.4000 mg | SUBLINGUAL_TABLET | SUBLINGUAL | Status: DC | PRN

## 2020-06-28 MED ORDER — ACETAMINOPHEN 325 MG PO TABS: 325.0000 mg | ORAL_TABLET | ORAL | Status: DC | PRN

## 2020-06-28 MED ORDER — ONDANSETRON HCL 4 MG/2ML IJ SOLN: 4.0000 mg | Freq: Four times a day (QID) | INTRAMUSCULAR | Status: DC | PRN

## 2020-06-28 MED ORDER — FENTANYL CITRATE (PF) 100 MCG/2ML IJ SOLN
INTRAMUSCULAR | Status: AC
  Filled 2020-06-28: qty 2

## 2020-06-28 MED ORDER — ONDANSETRON HCL 4 MG/2ML IJ SOLN
INTRAMUSCULAR | Status: DC | PRN
  Administered 2020-06-28: 4 mg via INTRAVENOUS

## 2020-06-28 MED ORDER — PROPOFOL 10 MG/ML IV BOLUS
INTRAVENOUS | Status: DC | PRN
  Administered 2020-06-28: 100 mg via INTRAVENOUS

## 2020-06-28 MED ORDER — SUGAMMADEX SODIUM 200 MG/2ML IV SOLN
INTRAVENOUS | Status: DC | PRN
  Administered 2020-06-28: 200 mg via INTRAVENOUS
  Administered 2020-06-28: 100 mg via INTRAVENOUS

## 2020-06-28 MED ORDER — AMLODIPINE BESYLATE 2.5 MG PO TABS
2.5000 mg | ORAL_TABLET | Freq: Every day | ORAL | Status: DC
  Administered 2020-06-29: 2.5 mg via ORAL
  Filled 2020-06-28 (×2): qty 1

## 2020-06-28 MED ORDER — LEVOTHYROXINE SODIUM 75 MCG PO TABS
75.0000 ug | ORAL_TABLET | Freq: Every day | ORAL | Status: DC
  Administered 2020-06-29: 75 ug via ORAL
  Filled 2020-06-28: qty 1

## 2020-06-28 MED ORDER — EPHEDRINE SULFATE-NACL 50-0.9 MG/10ML-% IV SOSY
PREFILLED_SYRINGE | INTRAVENOUS | Status: DC | PRN
  Administered 2020-06-28: 15 mg via INTRAVENOUS

## 2020-06-28 MED ORDER — FUROSEMIDE 20 MG PO TABS
20.0000 mg | ORAL_TABLET | Freq: Every day | ORAL | Status: DC
  Filled 2020-06-28 (×2): qty 1

## 2020-06-28 MED ORDER — HEPARIN (PORCINE) IN NACL 1000-0.9 UT/500ML-% IV SOLN
INTRAVENOUS | Status: AC
  Filled 2020-06-28: qty 500

## 2020-06-28 MED ORDER — LIDOCAINE 2% (20 MG/ML) 5 ML SYRINGE
INTRAMUSCULAR | Status: DC | PRN
  Administered 2020-06-28 (×2): 50 mg via INTRAVENOUS

## 2020-06-28 MED ORDER — SODIUM CHLORIDE 0.9% IV SOLUTION: Freq: Once | INTRAVENOUS | Status: DC

## 2020-06-28 MED ORDER — ROCURONIUM BROMIDE 10 MG/ML (PF) SYRINGE
PREFILLED_SYRINGE | INTRAVENOUS | Status: DC | PRN
  Administered 2020-06-28: 20 mg via INTRAVENOUS
  Administered 2020-06-28: 60 mg via INTRAVENOUS

## 2020-06-28 MED ORDER — CEFAZOLIN SODIUM-DEXTROSE 2-4 GM/100ML-% IV SOLN
2.0000 g | INTRAVENOUS | Status: AC
  Administered 2020-06-28: 2 g via INTRAVENOUS

## 2020-06-28 MED ORDER — CHLORHEXIDINE GLUCONATE 4 % EX LIQD
4.0000 "application " | Freq: Once | CUTANEOUS | Status: DC
  Filled 2020-06-28: qty 60

## 2020-06-28 MED ORDER — METOPROLOL SUCCINATE ER 25 MG PO TB24
25.0000 mg | ORAL_TABLET | Freq: Every day | ORAL | Status: DC
  Administered 2020-06-29: 25 mg via ORAL
  Filled 2020-06-28: qty 1

## 2020-06-28 MED ORDER — SODIUM CHLORIDE 0.9 % IV SOLN
INTRAVENOUS | Status: AC
  Filled 2020-06-28: qty 2

## 2020-06-28 MED ORDER — AMIODARONE HCL 100 MG PO TABS
100.0000 mg | ORAL_TABLET | Freq: Every day | ORAL | Status: DC
  Administered 2020-06-29: 100 mg via ORAL
  Filled 2020-06-28 (×2): qty 1

## 2020-06-28 MED ORDER — HEPARIN (PORCINE) IN NACL 1000-0.9 UT/500ML-% IV SOLN
INTRAVENOUS | Status: DC | PRN
  Administered 2020-06-28: 500 mL

## 2020-06-28 SURGICAL SUPPLY — 20 items
CABLE ADAPT PACING TEMP 12FT (ADAPTER) ×2 IMPLANT
CABLE SURGICAL S-101-97-12 (CABLE) ×5 IMPLANT
CATH S G BIP PACING (CATHETERS) ×2 IMPLANT
EXTENDER BULLDOG LEAD (MISCELLANEOUS) ×2 IMPLANT
GUIDEWIRE ANGLED .035X150CM (WIRE) ×2 IMPLANT
IPG PACE AZUR XT DR MRI W1DR01 (Pacemaker) IMPLANT
KIT MICROPUNCTURE NIT STIFF (SHEATH) ×2 IMPLANT
LEAD CAPSURE NOVUS 45CM (Lead) IMPLANT
LEAD CAPSURE NOVUS 5076-52CM (Lead) ×2 IMPLANT
PACE AZURE XT DR MRI W1DR01 (Pacemaker) ×3 IMPLANT
PAD PRO RADIOLUCENT 2001M-C (PAD) ×3 IMPLANT
PIN PLUG IS-1 DEFIB (PIN) ×2 IMPLANT
SHEATH 11 SUB-C ROTATE DILATOR (SHEATH) ×2 IMPLANT
SHEATH 7FR PRELUDE SNAP 13 (SHEATH) ×4 IMPLANT
SHEATH 7FR PRELUDE SNAP 25 (SHEATH) ×2 IMPLANT
SHEATH PINNACLE 5F 10CM (SHEATH) ×2 IMPLANT
SHEATH PINNACLE 6F 10CM (SHEATH) ×2 IMPLANT
SHEATH TIGHTRAIL SUB-C (SHEATH) ×2 IMPLANT
TRAY PACEMAKER INSERTION (PACKS) ×3 IMPLANT
WIRE AMPLATZ SS-J .035X180CM (WIRE) ×2 IMPLANT

## 2020-06-28 NOTE — Anesthesia Procedure Notes (Signed)
Procedure Name: Intubation Date/Time: 06/28/2020 3:18 PM Performed by: Janace Litten, CRNA Pre-anesthesia Checklist: Patient identified, Emergency Drugs available, Suction available and Patient being monitored Patient Re-evaluated:Patient Re-evaluated prior to induction Oxygen Delivery Method: Circle System Utilized Preoxygenation: Pre-oxygenation with 100% oxygen Induction Type: IV induction Ventilation: Mask ventilation without difficulty Laryngoscope Size: Mac and 3 Grade View: Grade I Tube type: Oral Tube size: 7.0 mm Number of attempts: 1 Airway Equipment and Method: Stylet Placement Confirmation: ETT inserted through vocal cords under direct vision,  positive ETCO2 and breath sounds checked- equal and bilateral Secured at: 18 cm Tube secured with: Tape Dental Injury: Teeth and Oropharynx as per pre-operative assessment

## 2020-06-28 NOTE — Interval H&P Note (Signed)
History and Physical Interval Note:  06/28/2020 3:07 PM  Brandi Chambers  has presented today for surgery, with the diagnosis of ERI AND LEAD FAILURE ERI AND LEAD FAILURE.  The various methods of treatment have been discussed with the patient and family. After consideration of risks, benefits and other options for treatment, the patient has consented to  Procedure(s) with comments: [place] (N/A) - BARTLE BACK UP as a surgical intervention.  The patient's history has been reviewed, patient examined, no change in status, stable for surgery.  I have reviewed the patient's chart and labs.  Questions were answered to the patient's satisfaction.     Brandi Chambers

## 2020-06-28 NOTE — Anesthesia Procedure Notes (Signed)
Arterial Line Insertion Start/End7/13/2021 1:10 AM, 06/28/2020 1:20 AM Performed by: Lillia Abed, MD, Janace Litten, CRNA, CRNA  Patient location: Pre-op. Preanesthetic checklist: patient identified, IV checked, site marked, risks and benefits discussed, surgical consent, monitors and equipment checked, pre-op evaluation, timeout performed and anesthesia consent Lidocaine 1% used for infiltration Right, radial was placed Catheter size: 20 G Hand hygiene performed , maximum sterile barriers used  and Seldinger technique used Allen's test indicative of satisfactory collateral circulation Attempts: 2 Procedure performed without using ultrasound guided technique. Following insertion, dressing applied and Biopatch. Post procedure assessment: normal  Patient tolerated the procedure well with no immediate complications.

## 2020-06-28 NOTE — Progress Notes (Signed)
Rt radial arterial line d/c'ed; pressure held x 10 minutes. Level 0. Palpable rt radial. Dressed w/gauze and tegaderm. Rt hand and fingers warm and pink

## 2020-06-28 NOTE — Progress Notes (Signed)
PHARMACY NOTE:  ANTIMICROBIAL RENAL DOSAGE ADJUSTMENT  Current antimicrobial regimen includes a mismatch between antimicrobial dosage and estimated renal function.  As per policy approved by the Pharmacy & Therapeutics and Medical Executive Committees, the antimicrobial dosage will be adjusted accordingly.  Current antimicrobial dosage:  Ancef 1gm IV Q6H x 3 doses   Indication: surgical prophylaxis  Renal Function:  Estimated Creatinine Clearance: 19.5 mL/min (A) (by C-G formula based on SCr of 1.67 mg/dL (H)). []      On intermittent HD, scheduled: []      On CRRT    Antimicrobial dosage has been changed to:  Ancef 1gm IV Q12H x 2 doses   Brandi Chambers D. Mina Marble, PharmD, BCPS, [place] 06/28/2020, 7:06 PM

## 2020-06-28 NOTE — Progress Notes (Signed)
   06/28/20 1900  Assess: MEWS Score  Temp (!) 94.6 F (34.8 C)  BP (!) 166/75  Pulse Rate (!) 59  ECG Heart Rate 60  SpO2 98 %  Assess: MEWS Score  MEWS Temp 2  MEWS Systolic 0  MEWS Pulse 0  MEWS RR 1  MEWS LOC 0  MEWS Score 3  MEWS Score Color Yellow  Assess: if the MEWS score is Yellow or Red  Were vital signs taken at a resting state? Yes  Focused Assessment Documented focused assessment  Early Detection of Sepsis Score *See Row Information* Low  MEWS guidelines implemented *See Row Information* Yes  Treat  MEWS Interventions Other (Comment) (Notified PA )  Take Vital Signs  Increase Vital Sign Frequency  Yellow: Q 2hr X 2 then Q 4hr X 2, if remains yellow, continue Q 4hrs  Escalate  MEWS: Escalate Yellow: discuss with charge nurse/RN and consider discussing with provider and RRT  Notify: Charge Nurse/RN  Name of Charge Nurse/RN Notified Chanda Busing  Date Charge Nurse/RN Notified 06/28/20  Time Charge Nurse/RN Notified 1910  Notify: Provider  Provider Name/Title Angie Duke PA  Date Provider Notified 06/28/20  Time Provider Notified 1910  Notification Type Page  Notification Reason Change in status  Response See new orders  Date of Provider Response 06/28/20  Time of Provider Response 1915  Document  Patient Outcome Other (Comment) (stable on unit, pt asymptomatic)  Progress note created (see row info) Yes

## 2020-06-28 NOTE — Transfer of Care (Signed)
Immediate Anesthesia Transfer of Care Note  Patient: Brandi Chambers  Procedure(s) Performed: [facility] (N/A )  Patient Location: PACU  Anesthesia Type:General  Level of Consciousness: awake, alert  and oriented  Airway & Oxygen Therapy: Patient Spontanous Breathing and Patient connected to nasal cannula oxygen  Post-op Assessment: Report given to RN and Post -op Vital signs reviewed and stable  Post vital signs: Reviewed and stable  Last Vitals:  Vitals Value Taken Time  BP 146/65 06/28/20 1725  Temp 36.2 C 06/28/20 1725  Pulse 59 06/28/20 1729  Resp 11 06/28/20 1729  SpO2 98 % 06/28/20 1729  Vitals shown include unvalidated device data.  Last Pain:  Vitals:   06/28/20 1725  TempSrc: Temporal  PainSc:          Complications: No complications documented.

## 2020-06-29 ENCOUNTER — Encounter (HOSPITAL_COMMUNITY): Payer: Self-pay | Admitting: Internal Medicine

## 2020-06-29 ENCOUNTER — Inpatient Hospital Stay (HOSPITAL_COMMUNITY): Payer: PPO

## 2020-06-29 DIAGNOSIS — T82110S Breakdown (mechanical) of cardiac electrode, sequela: Secondary | ICD-10-CM

## 2020-06-29 MED ORDER — CEFAZOLIN SODIUM-DEXTROSE 1-4 GM/50ML-% IV SOLN
1.0000 g | Freq: Two times a day (BID) | INTRAVENOUS | Status: AC
  Administered 2020-06-29: 1 g via INTRAVENOUS
  Filled 2020-06-29: qty 50

## 2020-06-29 MED FILL — Fentanyl Citrate Preservative Free (PF) Inj 100 MCG/2ML: INTRAMUSCULAR | Qty: 2 | Status: AC

## 2020-06-29 NOTE — Discharge Instructions (Signed)
    Supplemental Discharge Instructions for  Pacemaker/Defibrillator Patients  Activity No heavy lifting or vigorous activity with your left/right arm for 6 to 8 weeks.  Do not raise your left/right arm above your head for one week.  Gradually raise your affected arm as drawn below.             07/02/2020                 07/03/2020                07/04/2020               07/05/2020 __  NO DRIVING for  1 week   ; you may begin driving on  0/38/3338   .  WOUND CARE - Keep the wound area clean and dry.  Do not get this area wet for one week. No showers for one week; you may shower on  07/05/2020   . - The tape/steri-strips on your wound will fall off; do not pull them off.  No bandage is needed on the site.  DO  NOT apply any creams, oils, or ointments to the wound area. - If you notice any drainage or discharge from the wound, any swelling or bruising at the site, or you develop a fever > 101? F after you are discharged home, call the office at once.  Special Instructions - You are still able to use cellular telephones; use the ear opposite the side where you have your pacemaker/defibrillator.  Avoid carrying your cellular phone near your device. - When traveling through airports, show security personnel your identification card to avoid being screened in the metal detectors.  Ask the security personnel to use the hand wand. - Avoid arc welding equipment, MRI testing (magnetic resonance imaging), TENS units (transcutaneous nerve stimulators).  Call the office for questions about other devices. - Avoid electrical appliances that are in poor condition or are not properly grounded. - Microwave ovens are safe to be near or to operate.

## 2020-06-29 NOTE — Anesthesia Postprocedure Evaluation (Signed)
Anesthesia Post Note  Patient: TIMARA LOMA  Procedure(s) Performed: PACEMAKER SYSTEM REVISION (N/A )     Patient location during evaluation: Cath Lab Anesthesia Type: General Level of consciousness: patient cooperative and awake Pain management: pain level controlled Vital Signs Assessment: post-procedure vital signs reviewed and stable Respiratory status: spontaneous breathing, nonlabored ventilation, respiratory function stable and patient connected to nasal cannula oxygen Cardiovascular status: blood pressure returned to baseline and stable Postop Assessment: no apparent nausea or vomiting Anesthetic complications: no   No complications documented.  Last Vitals:  Vitals:   06/29/20 0802 06/29/20 1124  BP:  131/66  Pulse:    Resp:    Temp: 36.4 C 36.7 C  SpO2:      Last Pain:  Vitals:   06/29/20 1124  TempSrc: Oral  PainSc:                  Infiniti Hoefling

## 2020-06-29 NOTE — Discharge Summary (Addendum)
ELECTROPHYSIOLOGY PROCEDURE DISCHARGE SUMMARY    Patient ID: Brandi Chambers,  MRN: [id number], DOB/AGE: Mar 10, 1932 86 y.o.  Admit date: 06/28/2020 Discharge date: 06/29/2020  Primary Care Physician: [name], MD  Primary Cardiologist/Electrophysiologist: Dr. [name]  Primary Discharge Diagnosis:  1. PPM lead failure  Secondary Discharge Diagnosis:  1. HTN 2. Persistent AFib     CHA2DS2Vasc is 4, on Eliquis, appropriately dosed 3. hypothyroid  No Known Allergies   Procedures This Admission:  1. 06/29/2020 Conclusion: Successful pacing system extraction and insertion of a new dual-chamber pacemaker generator along with a new ventricular pacing lead.  Unfortunately because of very tight stenosis at the junction of the left subclavian and innominate veins, a second atrial lead could not be placed Medtronic dual-chamber pacemaker, serial number [id number] pacing lead to be advanced into the right ventricular apical septum 2.  CXR on 06/29/2020 demonstrated no pneumothorax status post device implantation.   Brief HPI: Brandi Chambers is a 86 y.o. female was referred to Dr. Lovena Le for consideration of PPM system extraction and new implant with hx of RV lead with high thresholds and battery depletion   Hospital Course:  The patient was admitted and extraction of her previous dual chamber PPM system, and implant of new single chamber PPM RV lead only, given stenosis of vein, unable to get A lead in place. She was monitored on telemetry overnight which demonstrated V pacing.  Left chest was without hematoma or ecchymosis.  The device was interrogated and found to be functioning normally.  CXR was obtained and demonstrated no pneumothorax status post device implantation.  Wound care, arm mobility, and restrictions were reviewed with the patient.  The patient was feels well, she was examined by Dr. Lovena Le and considered stable for discharge to home.    Physical  Exam: Vitals:   06/29/20 0408 06/29/20 0802 06/29/20 1000 06/29/20 1124  BP:  94/63 (!) 119/56 131/66  Pulse:  60 62   Resp: 18     Temp: 98.2 F (36.8 C) 97.6 F (36.4 C)  98.1 F (36.7 C)  TempSrc: Oral Oral  Oral  SpO2:   98%   Weight: 50 kg     Height:        GEN- The patient is well appearing, alert and oriented x 3 today.   HEENT: normocephalic, atraumatic; sclera clear, conjunctiva pink; hearing intact; oropharynx clear; neck supple, no JVP Lungs- CTA b/l, normal work of breathing.  No wheezes, rales, rhonchi Heart- RRR, no murmurs, rubs or gallops, PMI not laterally displaced GI- soft, non-tender, non-distended Extremities- no clubbing, cyanosis, or edema MS- no significant deformity, age appropriate atrophy Skin- warm and dry, no rash or lesion, left chest without hematoma/ecchymosis Psych- euthymic mood, full affect Neuro- no gross deficits   Labs:   Lab Results  Component Value Date   WBC 7.7 06/17/2020   HGB 12.1 06/17/2020   HCT 36.2 06/17/2020   MCV 86 06/17/2020   PLT 247 06/17/2020   No results for input(s): NA, K, CL, CO2, BUN, CREATININE, CALCIUM, PROT, BILITOT, ALKPHOS, ALT, AST, GLUCOSE in the last 168 hours.  Invalid input(s): LABALBU  Discharge Medications:  Allergies as of 06/29/2020   No Known Allergies      Medication List     TAKE these medications    amiodarone 200 MG tablet Commonly known as: PACERONE Take 100 mg by mouth daily.   amLODipine 2.5 MG tablet Commonly known as: NORVASC Take 2.5 mg by  mouth daily.   Eliquis 2.5 MG Tabs tablet Generic drug: apixaban TAKE 1 TABLET BY MOUTH TWICE A DAY What changed: how much to take Notes to patient: Do not resume until Sunday 07/03/2020   furosemide 20 MG tablet Commonly known as: LASIX Take 20 mg by mouth daily.   levothyroxine 75 MCG tablet Commonly known as: SYNTHROID Take 75 mcg by mouth daily before breakfast.   metoprolol succinate 25 MG 24 hr tablet Commonly known  as: TOPROL-XL TAKE 1 TABLET BY MOUTH EVERY DAY   nitroGLYCERIN 0.4 MG SL tablet Commonly known as: NITROSTAT Place 1 tablet (0.4 mg total) under the tongue every 5 (five) minutes as needed for chest pain.               Discharge Care Instructions  (From admission, onward)           Start     Ordered   06/29/20 0000  Discharge wound care:       Comments: As per discharge instructions   06/29/20 1329            Disposition: Home Discharge Instructions     Diet - low sodium heart healthy   Complete by: As directed    Discharge wound care:   Complete by: As directed    As per discharge instructions   Increase activity slowly   Complete by: As directed        Follow-up Information     [facility] Follow [facility].   Specialty: Cardiology Why: 07/12/2020 @ 10:00AM, wound check visit Contact information: [address], [address] [place]        Evans Lance, MD Follow up.   Specialty: Cardiology Why: 10/04/2020 @ 3:00PM Contact information: [address] [address] [phone]                 Duration of Discharge Encounter: Greater than 30 minutes including physician time.  SignedTommye Standard, PA-C 06/29/2020 1:30 PM  EP Atending  Patient seen and examined. Agree with the findings as noted above. The patient is stable after PM system extraction and insertion of a new PPM gen and RV lead. The atrial lead could not be placed due to a very tight left subclavian vein stenosis. PM system interrogation demonstrates normal vvi pacing. CXR looks good. She will be discharged home with usual followup.   Mikle Bosworth.D.

## 2020-06-29 NOTE — Progress Notes (Signed)
  Mobility Specialist Criteria Algorithm Info.  Mobility Team: [facility] elevated:Self regulated Activity: Ambulated in hall (In chair before and after ambulation) Range of motion: Active;All extremities Level of assistance: Independent after set-up Assistive device: None Minutes sitting in chair:  Minutes stood: 2 minutes Minutes ambulated: 2 minutes Distance ambulated (ft): 40 ft Mobility response: Tolerated well Bed Position: Chair (Recliner chair)  Pt tolerated ambulation well w/ no complaints. Distance was limited d/t not having shoes on.   06/29/2020 1:47 PM

## 2020-06-29 NOTE — Progress Notes (Signed)
Discharge instructions (including medications) discussed with and copy provided to patient/caregiver 

## 2020-06-29 NOTE — Plan of Care (Signed)
  Problem: Education: Goal: Knowledge of General Education information will improve Description: Including pain rating scale, medication(s)/side effects and non-pharmacologic comfort measures Outcome: Adequate for Discharge   

## 2020-06-30 LAB — TYPE AND SCREEN
ABO/RH(D): O NEG
Antibody Screen: NEGATIVE
Unit division: 0
Unit division: 0

## 2020-06-30 LAB — BPAM RBC
Blood Product Expiration Date: 202108022359
Blood Product Expiration Date: 202108032359
ISSUE DATE / TIME: 202107131540
ISSUE DATE / TIME: 202107131540
Unit Type and Rh: 9500
Unit Type and Rh: 9500

## 2020-07-08 ENCOUNTER — Telehealth: Payer: Self-pay | Admitting: Internal Medicine

## 2020-07-08 NOTE — Telephone Encounter (Signed)
°  Patient had pacemaker placed on 06/28/20 and yesterday she had not come up right below the incision. She denies any pain but she states it is getting bigger. Please call to discuss.

## 2020-07-08 NOTE — Telephone Encounter (Signed)
The pt called back to see if her picture was received. I let her know the nurse did not receive it and to try again. I made sure she had the right email address as well.

## 2020-07-08 NOTE — Telephone Encounter (Signed)
Picture received from patient. Currently on Eliquis. Called Dr. Lovena Le and recommendations for patient to stop Eliquis and any ASA she is taking. Also could like to see patient in office next week. Called patient to inform, agreeable and understanding. Patient reminded of device clinic apt Tuesday 07/12/20 at 10:00 AM. Advised patient to use ice packs over the weekend to help to reduce swelling. Advised to place barrier between skin and ice pack.  ED precautions given to patient.

## 2020-07-08 NOTE — Telephone Encounter (Signed)
Patient called and report of soft raised area below the dressing site. Patient reports of raised area she noticed yesterday and reports of the area could have increased in size. Patient denies any fever, chills, redness, drainage or warmth to site. Patient denies any pain,  Requested patient to send picture to see if patient may need to come into device clinic for wound apt. Patient agreeable. Advised patient if she is not able to send a picture to please call device clinic back today before 5:00 PM so we can schedule for patient to come in for wound check. Patient agreeable and verbalizes understanding. Direct phone number and my name give to patient.

## 2020-07-12 ENCOUNTER — Other Ambulatory Visit: Payer: Self-pay

## 2020-07-12 ENCOUNTER — Ambulatory Visit (INDEPENDENT_AMBULATORY_CARE_PROVIDER_SITE_OTHER): Payer: PPO | Admitting: Emergency Medicine

## 2020-07-12 DIAGNOSIS — I442 Atrioventricular block, complete: Secondary | ICD-10-CM

## 2020-07-12 DIAGNOSIS — I495 Sick sinus syndrome: Secondary | ICD-10-CM | POA: Diagnosis not present

## 2020-07-12 DIAGNOSIS — I5032 Chronic diastolic (congestive) heart failure: Secondary | ICD-10-CM

## 2020-07-12 LAB — CUP PACEART INCLINIC DEVICE CHECK
Battery Remaining Longevity: 147 mo
Battery Voltage: 3.22 V
Brady Statistic AP VP Percent: 0 %
Brady Statistic AP VS Percent: 0 %
Brady Statistic AS VP Percent: 99.97 %
Brady Statistic AS VS Percent: 0.03 %
Brady Statistic RA Percent Paced: 0 %
Brady Statistic RV Percent Paced: 99.97 %
Date Time Interrogation Session: 20210727133836
Implantable Lead Implant Date: 20210713
Implantable Lead Location: 753860
Implantable Lead Model: 5076
Implantable Pulse Generator Implant Date: 20210713
Lead Channel Impedance Value: 3439 Ohm
Lead Channel Impedance Value: 3439 Ohm
Lead Channel Impedance Value: 437 Ohm
Lead Channel Impedance Value: 532 Ohm
Lead Channel Pacing Threshold Amplitude: 0.875 V
Lead Channel Pacing Threshold Pulse Width: 0.4 ms
Lead Channel Setting Pacing Amplitude: 3.5 V
Lead Channel Setting Pacing Pulse Width: 0.4 ms
Lead Channel Setting Sensing Sensitivity: 0.9 mV

## 2020-07-12 NOTE — Patient Instructions (Addendum)
Please continue to monitor for increased swelling, bruising and hardening. You may resume your blood thinner Eliquis next Monday 07/18/20. Call [facility] if you have questions or concerns. [phone].

## 2020-07-12 NOTE — Progress Notes (Signed)
Wound check appointment. Steri-strips removed. Hematoma noted over device. Brusing and swelling noted. Dr. Lovena Le in to see patient and recommends patient to restart Eliquis 07/18/20.  Incision edges approximated. Normal device function. Thresholds, sensing, and impedances consistent with implant measurements. Device programmed at 3.5V/auto capture programmed on for extra safety margin until 3 month visit. Histogram distribution appropriate for patient and level of activity. No mode switches or high ventricular rates noted. Patient educated about wound care, arm mobility, lifting restrictions. ROV in 3 months with Dr. Lovena Le. Next remote 09/13/20.   Patient education given.

## 2020-07-19 ENCOUNTER — Emergency Department (HOSPITAL_COMMUNITY): Payer: PPO

## 2020-07-19 ENCOUNTER — Emergency Department (HOSPITAL_COMMUNITY)
Admission: EM | Admit: 2020-07-19 | Discharge: 2020-07-19 | Disposition: A | Discharge status: Home / Self Care | Payer: PPO | Attending: Emergency Medicine | Admitting: Emergency Medicine

## 2020-07-19 DIAGNOSIS — R079 Chest pain, unspecified: Secondary | ICD-10-CM | POA: Diagnosis not present

## 2020-07-19 DIAGNOSIS — W19XXXA Unspecified fall, initial encounter: Secondary | ICD-10-CM | POA: Diagnosis not present

## 2020-07-19 DIAGNOSIS — Y92009 Unspecified place in unspecified non-institutional (private) residence as the place of occurrence of the external cause: Secondary | ICD-10-CM | POA: Insufficient documentation

## 2020-07-19 DIAGNOSIS — Y999 Unspecified external cause status: Secondary | ICD-10-CM | POA: Diagnosis not present

## 2020-07-19 DIAGNOSIS — S199XXA Unspecified injury of neck, initial encounter: Secondary | ICD-10-CM | POA: Diagnosis not present

## 2020-07-19 DIAGNOSIS — S0181XA Laceration without foreign body of other part of head, initial encounter: Secondary | ICD-10-CM

## 2020-07-19 DIAGNOSIS — R000 Tachycardia, unspecified: Secondary | ICD-10-CM | POA: Diagnosis not present

## 2020-07-19 DIAGNOSIS — S0990XA Unspecified injury of head, initial encounter: Secondary | ICD-10-CM | POA: Diagnosis not present

## 2020-07-19 DIAGNOSIS — R0781 Pleurodynia: Secondary | ICD-10-CM | POA: Diagnosis not present

## 2020-07-19 DIAGNOSIS — Y939 Activity, unspecified: Secondary | ICD-10-CM | POA: Insufficient documentation

## 2020-07-19 DIAGNOSIS — Z7901 Long term (current) use of anticoagulants: Secondary | ICD-10-CM | POA: Diagnosis not present

## 2020-07-19 DIAGNOSIS — M542 Cervicalgia: Secondary | ICD-10-CM | POA: Diagnosis not present

## 2020-07-19 DIAGNOSIS — Y929 Unspecified place or not applicable: Secondary | ICD-10-CM | POA: Diagnosis not present

## 2020-07-19 DIAGNOSIS — I10 Essential (primary) hypertension: Secondary | ICD-10-CM | POA: Diagnosis not present

## 2020-07-19 DIAGNOSIS — I517 Cardiomegaly: Secondary | ICD-10-CM | POA: Diagnosis not present

## 2020-07-19 DIAGNOSIS — R52 Pain, unspecified: Secondary | ICD-10-CM | POA: Diagnosis not present

## 2020-07-19 DIAGNOSIS — R519 Headache, unspecified: Secondary | ICD-10-CM | POA: Diagnosis not present

## 2020-07-19 DIAGNOSIS — R609 Edema, unspecified: Secondary | ICD-10-CM | POA: Diagnosis not present

## 2020-07-19 DIAGNOSIS — R9431 Abnormal electrocardiogram [ECG] [EKG]: Secondary | ICD-10-CM | POA: Diagnosis not present

## 2020-07-19 LAB — I-STAT CHEM 8, ED
BUN: 37 mg/dL — ABNORMAL HIGH (ref 8–23)
Calcium, Ion: 1.04 mmol/L — ABNORMAL LOW (ref 1.15–1.40)
Chloride: 101 mmol/L (ref 98–111)
Creatinine, Ser: 2 mg/dL — ABNORMAL HIGH (ref 0.44–1.00)
Glucose, Bld: 103 mg/dL — ABNORMAL HIGH (ref 70–99)
HCT: 38 % (ref 36.0–46.0)
Hemoglobin: 12.9 g/dL (ref 12.0–15.0)
Potassium: 4.1 mmol/L (ref 3.5–5.1)
Sodium: 133 mmol/L — ABNORMAL LOW (ref 135–145)
TCO2: 20 mmol/L — ABNORMAL LOW (ref 22–32)

## 2020-07-19 NOTE — ED Provider Notes (Signed)
[facility] EM[facility]MENT Provider Note   CSN: [id number] Arrival date & time: 07/19/20  1346     History No chief complaint on file.   Brandi Chambers is a 86 y.o. female on Eliquis with recent pacemaker placement presented to emerge department mechanical fall, lost her footing and fell forward sustaining laceration to her chin.  She did not lose consciousness.  She denies any headache, neck pain, back pain.  She does have a lumbar compression fracture for which she is wearing a brace.  She also the pacemaker placed approximately 2 to 3 weeks ago.  She denies any active chest pain on my exam.  She is feeling well otherwise.  She denies any other injuries.  HPI     No past medical history on file.  There are no problems to display for this patient.   OB History   No obstetric history on file.     No family history on file.  Social History   Tobacco Use  . Smoking status: Not on file  Substance Use Topics  . Alcohol use: Not on file  . Drug use: Not on file    Home Medications Prior to Admission medications   Medication Sig Start Date End Date Taking? Authorizing Provider  amiodarone (PACERONE) 200 MG tablet Take 100 mg by mouth daily.  05/14/20  Yes [provider]  amLODipine (NORVASC) 2.5 MG tablet Take 2.5 mg by mouth daily. 07/18/20  Yes [provider]  ELIQUIS 2.5 MG TABS tablet Take 2.5 mg by mouth 2 (two) times daily. 07/01/20  Yes [provider]  furosemide (LASIX) 20 MG tablet Take 20 mg by mouth daily. 06/21/20  Yes [provider]  metoprolol succinate (TOPROL-XL) 25 MG 24 hr tablet Take 25 mg by mouth daily. 06/21/20  Yes [provider]  nitroGLYCERIN (NITROSTAT) 0.4 MG SL tablet Place 0.4 mg under the tongue every 5 (five) minutes x 3 doses as needed for chest pain.   Yes [provider]  SYNTHROID 75 MCG tablet Take 75 mcg by mouth daily. 06/21/20  Yes [provider]     Allergies    Patient has no known allergies.  Review of Systems   Review of Systems  Constitutional: Negative for chills and fever.  Eyes: Negative for photophobia and visual disturbance.  Respiratory: Negative for cough and shortness of breath.   Cardiovascular: Negative for chest pain and palpitations.  Gastrointestinal: Negative for abdominal pain, nausea and vomiting.  Genitourinary: Negative for dysuria and hematuria.  Musculoskeletal: Negative for arthralgias, myalgias, neck pain and neck stiffness.  Skin: Positive for wound. Negative for rash.  Neurological: Negative for syncope and headaches.  Psychiatric/Behavioral: Negative for agitation and confusion.  All other systems reviewed and are negative.   Physical Exam Updated Vital Signs BP (!) 152/77 (BP Location: Right Arm)   Pulse 61   Temp 97.6 F (36.4 C) (Oral)   Resp 16   Ht 5\' 1"  (1.549 m)   Wt 59 kg   SpO2 99%   BMI 24.56 kg/m   Physical Exam Vitals and nursing note reviewed.  Constitutional:      General: She is not in acute distress.    Appearance: She is well-developed.  HENT:     Head: Normocephalic. No raccoon eyes, Battle's sign, abrasion or contusion.     Jaw: There is normal jaw occlusion.      Comments: 1 cm laceration on chin, no active bleeding Swelling to  lower lip, no broken teeth visible, no open laceration Eyes:     Conjunctiva/sclera: Conjunctivae normal.  Cardiovascular:     Rate and Rhythm: Regular rhythm.     Pulses: Normal pulses.     Comments: Paced rhythm Pacemaker implant in left chest wall with overlying bruising, no purulent drainage Pulmonary:     Effort: Pulmonary effort is normal. No respiratory distress.     Breath sounds: Normal breath sounds.  Abdominal:     General: There is no distension.     Palpations: Abdomen is soft.     Tenderness: There is no abdominal tenderness.  Musculoskeletal:     Cervical back: Normal range of motion and neck supple. No rigidity  or tenderness.  Skin:    General: Skin is warm and dry.  Neurological:     General: No focal deficit present.     Mental Status: She is alert and oriented to person, place, and time.     Sensory: No sensory deficit.     Motor: No weakness.     Gait: Gait normal.     Comments: TLSO brace in place, no spinal midline ttp  Psychiatric:        Mood and Affect: Mood normal.        Behavior: Behavior normal.     ED Results / Procedures / Treatments   Labs (all labs ordered are listed, but only abnormal results are displayed) Labs Reviewed  I-STAT CHEM 8, ED - Abnormal; Notable for the following components:      Result Value   Sodium 133 (*)    BUN 37 (*)    Creatinine, Ser 2.00 (*)    Glucose, Bld 103 (*)    Calcium, Ion 1.04 (*)    TCO2 20 (*)    All other components within normal limits    EKG EKG Interpretation  Date/Time:  Tuesday July 19 2020 14:15:46 EDT Ventricular Rate:  60 PR Interval:    QRS Duration: 159 QT Interval:  509 QTC Calculation: 509 R Axis:   -84 Text Interpretation: Ventricular-paced complexes No further analysis attempted due to paced rhythm Confirmed by Octaviano Glow [phone]) on 07/19/2020 2:54:15 PM   Radiology CT HEAD WO CONTRAST  Result Date: 07/19/2020 CLINICAL DATA:  Recent trauma with headaches and neck pain, initial encounter EXAM: CT HEAD WITHOUT CONTRAST CT CERVICAL SPINE WITHOUT CONTRAST TECHNIQUE: Multidetector CT imaging of the head and cervical spine was performed following the standard protocol without intravenous contrast. Multiplanar CT image reconstructions of the cervical spine were also generated. COMPARISON:  None. FINDINGS: CT HEAD FINDINGS Brain: No evidence of acute infarction, hemorrhage, hydrocephalus, extra-axial collection or mass lesion/mass effect. Mild atrophic changes and chronic white matter ischemic change is seen. Vascular: No hyperdense vessel or unexpected calcification. Skull: Normal. Negative for fracture or focal  lesion. Sinuses/Orbits: No acute finding. Other: None. CT CERVICAL SPINE FINDINGS Alignment: Within normal limits. Skull base and vertebrae: 7 cervical segments are well visualized. Vertebral body height is well maintained. No acute fracture or acute facet abnormality is noted. Multilevel osteophytic changes are seen from C4-C7. Facet hypertrophic changes are noted as well. The odontoid is within normal limits. Soft tissues and spinal canal: Vascular calcifications are noted. No soft tissue abnormality is noted. Upper chest: Visualized lung apices are within normal limits. Other: None IMPRESSION: CT of the head: Chronic atrophic and ischemic changes without acute intracranial abnormality. CT of the cervical spine: Degenerative change without acute abnormality in the cervical spine. Electronically Signed  By: Inez Catalina M.D.   On: 07/19/2020 15:25   CT CERVICAL SPINE WO CONTRAST  Result Date: 07/19/2020 CLINICAL DATA:  Recent trauma with headaches and neck pain, initial encounter EXAM: CT HEAD WITHOUT CONTRAST CT CERVICAL SPINE WITHOUT CONTRAST TECHNIQUE: Multidetector CT imaging of the head and cervical spine was performed following the standard protocol without intravenous contrast. Multiplanar CT image reconstructions of the cervical spine were also generated. COMPARISON:  None. FINDINGS: CT HEAD FINDINGS Brain: No evidence of acute infarction, hemorrhage, hydrocephalus, extra-axial collection or mass lesion/mass effect. Mild atrophic changes and chronic white matter ischemic change is seen. Vascular: No hyperdense vessel or unexpected calcification. Skull: Normal. Negative for fracture or focal lesion. Sinuses/Orbits: No acute finding. Other: None. CT CERVICAL SPINE FINDINGS Alignment: Within normal limits. Skull base and vertebrae: 7 cervical segments are well visualized. Vertebral body height is well maintained. No acute fracture or acute facet abnormality is noted. Multilevel osteophytic changes are seen  from C4-C7. Facet hypertrophic changes are noted as well. The odontoid is within normal limits. Soft tissues and spinal canal: Vascular calcifications are noted. No soft tissue abnormality is noted. Upper chest: Visualized lung apices are within normal limits. Other: None IMPRESSION: CT of the head: Chronic atrophic and ischemic changes without acute intracranial abnormality. CT of the cervical spine: Degenerative change without acute abnormality in the cervical spine. Electronically Signed   By: Inez Catalina M.D.   On: 07/19/2020 15:25   DG Chest Portable 1 View  Result Date: 07/19/2020 CLINICAL DATA:  Chest pain after fall. EXAM: PORTABLE CHEST 1 VIEW COMPARISON:  June 29, 2020. FINDINGS: Stable cardiomegaly. No pneumothorax or pleural effusion is noted. Single lead left-sided pacemaker is unchanged in position. Both lungs are clear. The visualized skeletal structures are unremarkable. IMPRESSION: No active disease. Aortic Atherosclerosis (ICD10-I70.0). Electronically Signed   By: Marijo Conception M.D.   On: 07/19/2020 14:12    Procedures .Marland KitchenLaceration Repair  Date/Time: 07/19/2020 5:16 PM Performed by: Wyvonnia Dusky, MD Authorized by: Wyvonnia Dusky, MD   Consent:    Consent obtained:  Verbal   Consent given by:  Patient   Risks discussed:  Need for additional repair, poor cosmetic result, pain and infection   Alternatives discussed:  No treatment Anesthesia (see MAR for exact dosages):    Anesthesia method:  None Laceration details:    Location:  Face   Face location:  Chin   Length (cm):  1   Depth (mm):  2 Repair type:    Repair type:  Simple Pre-procedure details:    Preparation:  Patient was prepped and draped in usual sterile fashion Exploration:    Wound exploration: wound explored through full range of motion and entire depth of wound probed and visualized     Contaminated: no   Treatment:    Area cleansed with:  Saline Skin repair:    Repair method:  Steri-Strips and  tissue adhesive   Number of Steri-Strips:  3 Approximation:    Approximation:  Loose Post-procedure details:    Dressing:  Sterile dressing   Patient tolerance of procedure:  Tolerated well, no immediate complications   (including critical care time)  Medications Ordered in ED Medications - No data to display  ED Course  I have reviewed the triage vital signs and the nursing notes.  Pertinent labs & imaging results that were available during my care of the patient were reviewed by me and considered in my medical decision making (see chart for details).  86 yo female on [facility] presenting with mechanical fall from standing.  She has a small chin laceration.  No evidence of basillar skull fracture, spine fracture, or injuries to the chest, abdomen, pelvis or extremities.    Xray chest per my interpretation without acute traumatic findings.  Pacemaker appears intact and is pacing her heart rhythm on ECG per my read.  CTH and C-spine without acute traumatic findings.  Chin lac too small for sutures - closed with dermabond after a washout.  Steri strips.  Tetanus up to date.  Okay for discharge.  Ambulating independently.    Final Clinical Impression(s) / ED Diagnoses Final diagnoses:  Chin laceration, initial encounter  Fall, initial encounter  Traumatic injury of head, initial encounter    Rx / DC Orders ED Discharge Orders    None       Jarissa Sheriff, Carola Rhine, MD 07/19/20 [phone]

## 2020-07-19 NOTE — ED Triage Notes (Signed)
Pt here as a level 2 trauma after tripping  and falling hitting her chin and chest , no loc , pt has a lac to the chin

## 2020-07-19 NOTE — Progress Notes (Signed)
Responded to level 2 trauma to sup[oort patient and staff.  Patient tripped and fail causing several injuries. Provided emotional and spiritual support .  Chaplain will follow as needed.    Jaclynn Major, [place], [facility], Pager [phone]

## 2020-07-19 NOTE — Discharge Instructions (Addendum)
Your chin cut will heal over the next 5 days.  Keep it dry today.  Afterwards you can bathe normally.  The wound will heal on its own.

## 2020-07-19 NOTE — ED Notes (Signed)
Patient verbalizes understanding of discharge instructions. Opportunity for questioning and answers were provided. Armband removed by staff, pt discharged from ED with friend.

## 2020-07-19 NOTE — Progress Notes (Signed)
Orthopedic Tech Progress Note Patient Details:  Brandi Chambers [birth year] [id number] Level 2 trauma Patient ID: Brandi Chambers, female   DOB: Jul 05, 1932, 86 y.o.   MRN: [id number]   [facility] 07/19/2020, 1:51 PM

## 2020-08-12 ENCOUNTER — Other Ambulatory Visit: Payer: Self-pay | Admitting: Internal Medicine

## 2020-08-12 ENCOUNTER — Other Ambulatory Visit (HOSPITAL_COMMUNITY): Payer: Self-pay | Admitting: Nurse Practitioner

## 2020-09-07 ENCOUNTER — Other Ambulatory Visit: Payer: Self-pay | Admitting: *Deleted

## 2020-09-07 ENCOUNTER — Telehealth: Payer: Self-pay | Admitting: Internal Medicine

## 2020-09-07 DIAGNOSIS — R0602 Shortness of breath: Secondary | ICD-10-CM

## 2020-09-07 DIAGNOSIS — I5032 Chronic diastolic (congestive) heart failure: Secondary | ICD-10-CM

## 2020-09-07 NOTE — Telephone Encounter (Signed)
Unfortunately, the home remote monitor was unsuccessful on sending a remote transmission. Error code given 3230. [employer] tech support number given to patient.   ED precautions provided to patient. Routing to Dr. Tanna Furry nurse and triage.

## 2020-09-07 NOTE — Telephone Encounter (Signed)
Pt c/o swelling: STAT is pt has developed SOB within 24 hours  1) How much weight have you gained and in what time span?  ~ 5 lbs in a week   2) If swelling, where is the swelling located? Feet and legs   3) Are you currently taking a fluid pill? yes  4) Are you currently SOB? Yes   5) Do you have a log of your daily weights (if so, list)?  No. Pt states her base wt is 117 but the last time she weighed herself she weighed 124  6) Have you gained 3 pounds in a day or 5 pounds in a week? More than 5 lbs in a week  7) Have you traveled recently? no   Patient said she has had issues with her pacemaker going off all the time. She would like to be seen by Dr. Lovena Le soon.

## 2020-09-07 NOTE — Telephone Encounter (Signed)
Spoke with pt who reports weight gain of 7 pounds over the last 3 days.  Pt also reports increased SOB with lower extremity edema.  Denies current CP.  Pt is unable to lie flat.  She does report intermittent mild non-productive cough.  She is elevating feet and legs when resting with minimal resolution.  Pt reports she is taking medications as prescribed.  Will forward information to Dr [name] and his RN for review and recommendation.  Reviewed ED precautions with pt.  Pt verbalizes understanding and agrees with current plan.

## 2020-09-07 NOTE — Telephone Encounter (Signed)
Put patient on DOD for tomorrow per Dr. [name]. Ordered Echo.

## 2020-09-08 ENCOUNTER — Other Ambulatory Visit: Payer: Self-pay

## 2020-09-08 ENCOUNTER — Encounter: Payer: Self-pay | Admitting: Interventional Cardiology

## 2020-09-08 ENCOUNTER — Ambulatory Visit: Payer: PPO | Admitting: Interventional Cardiology

## 2020-09-08 VITALS — BP 130/80 | HR 64 | Ht 62.0 in | Wt 122.4 lb

## 2020-09-08 DIAGNOSIS — I5033 Acute on chronic diastolic (congestive) heart failure: Secondary | ICD-10-CM

## 2020-09-08 DIAGNOSIS — N184 Chronic kidney disease, stage 4 (severe): Secondary | ICD-10-CM | POA: Diagnosis not present

## 2020-09-08 DIAGNOSIS — R600 Localized edema: Secondary | ICD-10-CM

## 2020-09-08 MED ORDER — FUROSEMIDE 40 MG PO TABS: 40.0000 mg | ORAL_TABLET | Freq: Every day | ORAL | 0 refills | Status: DC

## 2020-09-08 NOTE — Progress Notes (Signed)
Cardiology Office Note   Date:  09/08/2020   ID:  Brandi [facility], DOB [birth year], MRN [id number]  PCP:  [name], MD    No chief complaint on file.  SHOB, weight gain  Wt Readings from Last 3 Encounters:  09/08/20 122 lb 6.4 oz (55.5 kg)  07/19/20 130 lb (59 kg)  06/29/20 110 lb 3.7 oz (50 kg)       History of Present Illness: Brandi Chambers is a 86 y.o. female  With a h/o pacemaker and prior systolic heart failure.  LVEF improved with medical therapy.   6/21 echo showed: "Left ventricular ejection fraction, by estimation, is 55 to 60%. The  left ventricle has normal function. The left ventricle has no regional  wall motion abnormalities. Left ventricular diastolic function could not  be evaluated.  2. Right ventricular systolic function is normal. The right ventricular  size is mildly enlarged. There is mildly elevated pulmonary artery  systolic pressure. The estimated right ventricular systolic pressure is  54.6 mmHg.  3. Left atrial size was severely dilated.  4. Right atrial size was mildly dilated.  5. The mitral valve is grossly normal. Mild to moderate mitral valve  regurgitation. No evidence of mitral stenosis.  6. Tricuspid valve regurgitation is mild to moderate.  7. The aortic valve is tricuspid. Aortic valve regurgitation is mild.  Mild aortic valve sclerosis is present, with no evidence of aortic valve  stenosis.  8. The inferior vena cava is normal in size with greater than 50%  respiratory variability, suggesting right atrial pressure of 3 mmHg.   Comparison(s): EF now 55-60%. Improved from prior study. "  Weight has increased 10 lbs over the past week.  SHe is more short of breath and feels swelling in her legs and at her pacer device.   Denies : Chest pain. Dizziness.  Nitroglycerin use. Palpitations. Syncope.   She uses the furosemide daily.  She avoids salty food.  No changes to her meds or diet recently.   Past Medical History:   Diagnosis Date  . Arthritis    RA IN HANDS  . Chronic congestive heart failure with left ventricular diastolic dysfunction ([place])   . CKD (chronic kidney disease)   . Complete heart block ([place])   . Hypertension   . Hypoglycemia   . Hypothyroidism   . Nephrolithiasis   . Osteoporosis   . Persistent atrial fibrillation ([place])   . Presence of permanent cardiac pacemaker   . Thyroid disease   . Typical atrial flutter [facility])     Past Surgical History:  Procedure Laterality Date  . APPENDECTOMY    . CARDIOVERSION N/A 05/14/2017   Procedure: CARDIOVERSION;  Surgeon: [name], MD;  Location: [place];  Service: Cardiovascular;  Laterality: N/A;  . CARDIOVERSION N/A 07/23/2018   Procedure: CARDIOVERSION;  Surgeon: [name], MD;  Location[facility] ENDOSCOPY;  Service: Cardiovascular;  Laterality: N/A;  . INSERT / REPLACE / REMOVE PACEMAKER    . LEAD REVISION/REPAIR N/A 06/09/2020   Procedure: LEAD REVISION/REPAIR;  Surgeon: [name], MD;  Location: [place] CV LAB;  Service: Cardiovascular;  Laterality: N/A;  . PACEMAKER IMPLANT N/A 06/06/2017   Procedure: Pacemaker Implant;  Surgeon: [name], MD;  Location: [place] CV LAB;  Service: Cardiovascular;  Laterality: N/A;  . PACEMAKER REVISION N/A 06/28/2020   Procedure: PACEMAKER SYSTEM REVISION;  Surgeon: Evans Lance, MD;  Location: [place] CV LAB;  Service: Cardiovascular;  Laterality: N/A;  BARTLE  BACK UP  . PPM GENERATOR REMOVAL N/A 06/09/2020   Procedure: PPM GENERATOR REMOVAL;  Surgeon: [name], MD;  Location: [place] CV LAB;  Service: Cardiovascular;  Laterality: N/A;     Current Outpatient Medications  Medication Sig Dispense Refill  . amiodarone (PACERONE) 200 MG tablet Take 0.5 tablets (100 mg total) by mouth daily. 45 tablet 1  . amLODipine (NORVASC) 2.5 MG tablet Take 2.5 mg by mouth daily.   3  . ELIQUIS 2.5 MG TABS tablet Take 2.5 mg by mouth 2 (two) times daily.    . furosemide  (LASIX) 20 MG tablet Take 20 mg by mouth daily.    Marland Kitchen levothyroxine (SYNTHROID, LEVOTHROID) 75 MCG tablet Take 75 mcg by mouth daily before breakfast.    . nitroGLYCERIN (NITROSTAT) 0.4 MG SL tablet Place 0.4 mg under the tongue every 5 (five) minutes x 3 doses as needed for chest pain.    Marland Kitchen SYNTHROID 75 MCG tablet Take 75 mcg by mouth daily.     No current facility-administered medications for this visit.    Allergies:   Patient has no known allergies.    Social History:  The patient  reports that she quit smoking about 51 years ago. Her smoking use included cigarettes. She has never used smokeless tobacco. She reports that she does not drink alcohol and does not use drugs.   Family History:  The patient's family history includes Breast cancer in her mother; Lung cancer in her father.    ROS:  Please see the history of present illness.   Otherwise, review of systems are positive for swelling.   All other systems are reviewed and negative.    PHYSICAL EXAM: VS:  BP 130/80   Pulse 64   Ht 5\' 2"  (1.575 m)   Wt 122 lb 6.4 oz (55.5 kg)   LMP  (LMP Unknown)   SpO2 96%   BMI 22.39 kg/m  , BMI Body mass index is 22.39 kg/m. GEN: Well nourished, well developed, in no acute distress  HEENT: normal  Neck: no JVD, carotid bruits, or masses Cardiac: RRR; no murmurs, rubs, or gallops,; bilateral 1+ pitting edema  Respiratory:  clear to auscultation bilaterally, normal work of breathing GI: soft, nontender, nondistended, + BS MS: no deformity or atrophy ; pacer site intact Skin: warm and dry, no rash Neuro:  Strength and sensation are intact Psych: euthymic mood, full affect   EKG:   The ekg ordered 8/3 demonstrates V paced rhythm   Recent Labs: 06/17/2020: Platelets 247 07/19/2020: BUN 37; Creatinine, Ser 2.00; Hemoglobin 12.9; Potassium 4.1; Sodium 133   Lipid Panel No results found for: CHOL, TRIG, HDL, CHOLHDL, VLDL, LDLCALC, LDLDIRECT   Other studies Reviewed: Additional  studies/ records that were reviewed today with results demonstrating: labs reviewed .   ASSESSMENT AND PLAN:  1. Volume overload. Weight increased.  Acute on chronic diastolic heart failure.  Increase Lasix to 40 mg daily.  Recheck labs next week.   Most recent echo showed normal LVEF. 2. CKD: Cr 2.0 in 8/21.  WIll eed to monitor with increased diuretics. 3. LE edema: Elevate legs.   4. Pacer: SHe feels some swelling at the pacer site.  May be some fluid accumulation.  Will have EP take a look if it does not improve with diuresis.  Hopefully will improve with diuresis.    Current medicines are reviewed at length with the patient today.  The patient concerns regarding her medicines were addressed.  The following  changes have been made:  No change  Labs/ tests ordered today include: BMet BNP on Monday No orders of the defined types were placed in this encounter.   Recommend 150 minutes/week of aerobic exercise Low fat, low carb, high fiber diet recommended  Disposition:   FU in with EP   Signed, Larae Grooms, MD  09/08/2020 10:18 AM    [facility] [address], [place], [place]  [address] Phone: [phone]; Fax: [phone]

## 2020-09-08 NOTE — Patient Instructions (Signed)
Medication Instructions:  Your physician has recommended you make the following change in your medication:   INCREASE: furosemide (lasix) to 40 mg once a day  *If you need a refill on your cardiac medications before your next appointment, please call your pharmacy*   Lab Work: Your physician recommends that you return for lab work on Monday 09/12/20 for BMET and BNP  If you have labs (blood work) drawn today and your tests are completely normal, you will receive your results only by:  [place] (if you have MyChart) OR  A paper copy in the mail If you have any lab test that is abnormal or we need to change your treatment, we will call you to review the results.   Testing/Procedures: None   Follow-Up: Keep follow up appointment with Dr. Lovena Le on 10/04/20 at 3:00 PM.   Other Instructions None

## 2020-09-12 ENCOUNTER — Other Ambulatory Visit: Payer: PPO | Admitting: *Deleted

## 2020-09-12 ENCOUNTER — Other Ambulatory Visit: Payer: Self-pay

## 2020-09-12 DIAGNOSIS — R600 Localized edema: Secondary | ICD-10-CM | POA: Diagnosis not present

## 2020-09-12 LAB — BASIC METABOLIC PANEL
BUN/Creatinine Ratio: 18 (ref 12–28)
BUN: 31 mg/dL — ABNORMAL HIGH (ref 8–27)
CO2: 22 mmol/L (ref 20–29)
Calcium: 8.9 mg/dL (ref 8.7–10.3)
Chloride: 97 mmol/L (ref 96–106)
Creatinine, Ser: 1.68 mg/dL — ABNORMAL HIGH (ref 0.57–1.00)
GFR calc Af Amer: 31 mL/min/{1.73_m2} — ABNORMAL LOW (ref >59)
GFR calc non Af Amer: 27 mL/min/{1.73_m2} — ABNORMAL LOW (ref >59)
Glucose: 77 mg/dL (ref 65–99)
Potassium: 4.6 mmol/L (ref 3.5–5.2)
Sodium: 135 mmol/L (ref 134–144)

## 2020-09-12 LAB — PRO B NATRIURETIC PEPTIDE: NT-Pro BNP: 1215 pg/mL — ABNORMAL HIGH (ref 0–738)

## 2020-09-14 ENCOUNTER — Encounter: Payer: PPO | Admitting: Internal Medicine

## 2020-09-27 ENCOUNTER — Ambulatory Visit (HOSPITAL_COMMUNITY): Payer: PPO | Attending: Cardiology

## 2020-09-27 ENCOUNTER — Other Ambulatory Visit: Payer: Self-pay

## 2020-09-27 DIAGNOSIS — R0602 Shortness of breath: Secondary | ICD-10-CM | POA: Insufficient documentation

## 2020-09-27 DIAGNOSIS — I5032 Chronic diastolic (congestive) heart failure: Secondary | ICD-10-CM | POA: Insufficient documentation

## 2020-09-27 LAB — ECHOCARDIOGRAM COMPLETE
Area-P 1/2: 3.08 cm2
MV M vel: 5.04 m/s
MV Peak grad: 101.6 mmHg
P 1/2 time: 546 msec
Radius: 0.4 cm
S' Lateral: 2.9 cm

## 2020-09-28 ENCOUNTER — Encounter: Payer: PPO | Admitting: Student

## 2020-09-28 ENCOUNTER — Ambulatory Visit: Payer: PPO | Admitting: Interventional Cardiology

## 2020-10-04 ENCOUNTER — Other Ambulatory Visit: Payer: Self-pay

## 2020-10-04 ENCOUNTER — Encounter: Payer: Self-pay | Admitting: Internal Medicine

## 2020-10-04 ENCOUNTER — Ambulatory Visit: Payer: PPO | Admitting: Internal Medicine

## 2020-10-04 VITALS — BP 150/86 | HR 82 | Ht 62.0 in | Wt 122.8 lb

## 2020-10-04 DIAGNOSIS — I442 Atrioventricular block, complete: Secondary | ICD-10-CM | POA: Diagnosis not present

## 2020-10-04 DIAGNOSIS — I10 Essential (primary) hypertension: Secondary | ICD-10-CM | POA: Diagnosis not present

## 2020-10-04 DIAGNOSIS — Z950 Presence of cardiac pacemaker: Secondary | ICD-10-CM | POA: Diagnosis not present

## 2020-10-04 DIAGNOSIS — I4819 Other persistent atrial fibrillation: Secondary | ICD-10-CM | POA: Diagnosis not present

## 2020-10-04 LAB — CUP PACEART INCLINIC DEVICE CHECK
Battery Remaining Longevity: 140 mo
Battery Voltage: 3.19 V
Brady Statistic AP VP Percent: 0 %
Brady Statistic AP VS Percent: 0 %
Brady Statistic AS VP Percent: 99.98 %
Brady Statistic AS VS Percent: 0.02 %
Brady Statistic RA Percent Paced: 0 %
Brady Statistic RV Percent Paced: 99.98 %
Date Time Interrogation Session: 20211019165606
Implantable Lead Implant Date: 20210713
Implantable Lead Location: 753860
Implantable Lead Model: 5076
Implantable Pulse Generator Implant Date: 20210713
Lead Channel Impedance Value: 3439 Ohm
Lead Channel Impedance Value: 3439 Ohm
Lead Channel Impedance Value: 418 Ohm
Lead Channel Impedance Value: 475 Ohm
Lead Channel Pacing Threshold Amplitude: 0.75 V
Lead Channel Pacing Threshold Pulse Width: 0.4 ms
Lead Channel Setting Pacing Amplitude: 2.5 V
Lead Channel Setting Pacing Pulse Width: 0.4 ms
Lead Channel Setting Sensing Sensitivity: 0.9 mV

## 2020-10-04 MED ORDER — FUROSEMIDE 20 MG PO TABS
60.0000 mg | ORAL_TABLET | Freq: Every day | ORAL | 3 refills | Status: DC
Start: 2020-10-04 — End: 2020-10-24

## 2020-10-04 NOTE — Progress Notes (Signed)
HPI Ms. Jaskiewicz returns today for PPM followup. She is a pleasant 86 yo woman with a h/o CHB, s/p PPM insertion who developed early His bundle lead failure requiring escalating output to maintain ventricular pacing. She does not have an escape. 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 We could not remove her RV lead and maintain access and ultimately had to sacrifice the atrial lead, which was used. Unfortunately we could only get down a single RV lead as the stenosis would only allow one lead to be placed. She has experienced worsening sob over the past 3-4 weeks and required additional lasix. She has not had syncope or chest pain. She has mild peripheral edema. No Known Allergies   Current Outpatient Medications  Medication Sig Dispense Refill  . amiodarone (PACERONE) 200 MG tablet Take 0.5 tablets (100 mg total) by mouth daily. 45 tablet 1  . amLODipine (NORVASC) 2.5 MG tablet Take 2.5 mg by mouth daily.   3  . ELIQUIS 2.5 MG TABS tablet Take 2.5 mg by mouth 2 (two) times daily.    . furosemide (LASIX) 40 MG tablet Take 1 tablet (40 mg total) by mouth daily. 90 tablet 0  . levothyroxine (SYNTHROID, LEVOTHROID) 75 MCG tablet Take 75 mcg by mouth daily before breakfast.    . nitroGLYCERIN (NITROSTAT) 0.4 MG SL tablet Place 0.4 mg under the tongue every 5 (five) minutes x 3 doses as needed for chest pain.    Marland Kitchen SYNTHROID 75 MCG tablet Take 75 mcg by mouth daily.     No current facility-administered medications for this visit.     Past Medical History:  Diagnosis Date  . Arthritis    RA IN HANDS  . Chronic congestive heart failure with left ventricular diastolic dysfunction ([place])   . CKD (chronic kidney disease)   . Complete heart block ([place])   . Hypertension   . Hypoglycemia   . Hypothyroidism   . Nephrolithiasis   . Osteoporosis   . Persistent atrial  fibrillation ([place])   . Presence of permanent cardiac pacemaker   . Thyroid disease   . Typical atrial flutter ([facility])     ROS:   All systems reviewed and negative except as noted in the HPI.   Past Surgical History:  Procedure Laterality Date  . APPENDECTOMY    . CARDIOVERSION N/A 05/14/2017   Procedure: CARDIOVERSION;  Surgeon: [name], MD;  Location: [place];  Service: Cardiovascular;  Laterality: N/A;  . CARDIOVERSION N/A 07/23/2018   Procedure: CARDIOVERSION;  Surgeon: [name], MD;  Location[facility] ENDOSCOPY;  Service: Cardiovascular;  Laterality: N/A;  . INSERT / REPLACE / REMOVE PACEMAKER    . LEAD REVISION/REPAIR N/A 06/09/2020   Procedure: LEAD REVISION/REPAIR;  Surgeon: [name], MD;  Location: [place] [facility];  Service: Cardiovascular;  Laterality: N/A;  . PACEMAKER IMPLANT N/A 06/06/2017   Procedure: Pacemaker Implant;  Surgeon: [name], MD;  Location: [place] [facility];  Service: Cardiovascular;  Laterality: N/A;  . PACEMAKER REVISION N/A 06/28/2020   Procedure: PACEMAKER SYSTEM REVISION;  Surgeon: Evans Lance, MD;  Location: [place] [facility];  Service: Cardiovascular;  Laterality: N/A;  BARTLE BACK UP  . PPM GENERATOR REMOVAL N/A 06/09/2020   Procedure: PPM GENERATOR REMOVAL;  Surgeon: [name], MD;  Location: [place] [facility];  Service: Cardiovascular;  Laterality: N/A;     Family History  Problem Relation Age of Onset  . Breast cancer Mother   . Lung cancer Father      Social History   Socioeconomic History  . Marital status: Married    Spouse name: Not on file  . Number of children: 1  . Years of education: Not on file  . Highest education level: Not on file  Occupational History  . Not on file  Tobacco Use  . Smoking status: Former Smoker    Types: Cigarettes    Quit date: 05/18/1969    Years since quitting: 51.4  . Smokeless tobacco: Never Used  Vaping Use  . Vaping Use: Never used  Substance and Sexual  Activity  . Alcohol use: No  . Drug use: No  . Sexual activity: Not on file  Other Topics Concern  . Not on file  Social History Narrative   Lives in [place] with her spouse.  Retired   Scientist, physiological Strain:   . Difficulty of Paying Living Expenses: Not on file  Food Insecurity:   . Worried About Charity fundraiser in the Last Year: Not on file  . Ran Out of Food in the Last Year: Not on file  Transportation Needs:   . Lack of Transportation (Medical): Not on file  . Lack of Transportation (Non-Medical): Not on file  Physical Activity:   . Days of Exercise per Week: Not on file  . Minutes of Exercise per Session: Not on file  Stress:   . Feeling of Stress : Not on file  Social Connections:   . Frequency of Communication with Friends and Family: Not on file  . Frequency of Social Gatherings with Friends and Family: Not on file  . Attends Religious Services: Not on file  . Active Member of Clubs or Organizations: Not on file  . Attends Archivist Meetings: Not on file  . Marital Status: Not on file  Intimate Partner Violence:   . Fear of Current or Ex-Partner: Not on file  . Emotionally Abused: Not on file  . Physically Abused: Not on file  . Sexually Abused: Not on file     BP (!) 150/86   Pulse 82   Ht 5\' 2"  (1.575 m)   Wt 122 lb 12.8 oz (55.7 kg)   LMP  (LMP Unknown)   SpO2 (!) 89%   BMI 22.46 kg/m   Physical Exam:  Well appearing NAD HEENT: Unremarkable Neck:  No JVD, no thyromegally Lymphatics:  No adenopathy Back:  No CVA tenderness Lungs:  Clear with no wheezes. Well healed PM incision. HEART:  Regular rate rhythm, no murmurs, no rubs, no clicks Abd:  soft, positive bowel sounds, no organomegally, no rebound, no guarding Ext:  2 plus pulses, no edema, no cyanosis, no clubbing Skin:  No rashes no nodules Neuro:  CN II through XII intact, motor grossly intact  EKG - ventricular pacing  DEVICE  Normal  device function.  See PaceArt for details.   Assess/Plan: 1. CHB - she is stable after PPM revision. 2. PPM - she has a single lead in the ventricle. 3. Probable PM syndrome - I discussed the possible treatment options. We will uptitrate her lasix and if still no benefit, consider adding an atrial lead. An alternative would be to stop the amio and she might go back to atrial fib 4. HTN -her sbp is up a bit. Hopefully this  will improve with more lasix.  Carleene Overlie Seairra Otani,MD

## 2020-10-04 NOTE — Patient Instructions (Addendum)
Medication Instructions:  Your physician has recommended you make the following change in your medication:   1.  INCREASE your furosemide (Lasix)--Take 60 mg by mouth once a day.  You may take a 40 mg tablet with a 20 mg tablet until they are gone.  Then your next prescription will be for furosemide 20 mg- then you will take 3 tablets by mouth daily.  Labwork: You will come to the [facility] office in 2 weeks for lab work:  October 18, 2020 anytime between 8:00 am and 4:00 pm and you do not need to be fasting.  Testing/Procedures: None ordered.  Follow-Up: Your physician wants you to follow-up in: one year with Dr. Lovena Le.   You will receive a reminder letter in the mail two months in advance. If you don't receive a letter, please call our office to schedule the follow-up appointment.  Remote monitoring is used to monitor your Pacemaker from home. This monitoring reduces the number of office visits required to check your device to one time per year. It allows [place] to keep an eye on the functioning of your device to ensure it is working properly. You are scheduled for a device check from home on 10/10/2020. You may send your transmission at any time that day. If you have a wireless device, the transmission will be sent automatically. After your physician reviews your transmission, you will receive a postcard with your next transmission date.  Any Other Special Instructions Will Be Listed Below (If Applicable).  If you need a refill on your cardiac medications before your next appointment, please call your pharmacy.

## 2020-10-13 DIAGNOSIS — I129 Hypertensive chronic kidney disease with stage 1 through stage 4 chronic kidney disease, or unspecified chronic kidney disease: Secondary | ICD-10-CM | POA: Diagnosis not present

## 2020-10-13 DIAGNOSIS — N1832 Chronic kidney disease, stage 3b: Secondary | ICD-10-CM | POA: Diagnosis not present

## 2020-10-13 DIAGNOSIS — M810 Age-related osteoporosis without current pathological fracture: Secondary | ICD-10-CM | POA: Diagnosis not present

## 2020-10-13 DIAGNOSIS — I4891 Unspecified atrial fibrillation: Secondary | ICD-10-CM | POA: Diagnosis not present

## 2020-10-13 DIAGNOSIS — I442 Atrioventricular block, complete: Secondary | ICD-10-CM | POA: Diagnosis not present

## 2020-10-13 DIAGNOSIS — I701 Atherosclerosis of renal artery: Secondary | ICD-10-CM | POA: Diagnosis not present

## 2020-10-13 DIAGNOSIS — E039 Hypothyroidism, unspecified: Secondary | ICD-10-CM | POA: Diagnosis not present

## 2020-10-18 ENCOUNTER — Other Ambulatory Visit: Payer: Self-pay

## 2020-10-18 ENCOUNTER — Other Ambulatory Visit: Payer: PPO | Admitting: *Deleted

## 2020-10-18 DIAGNOSIS — I10 Essential (primary) hypertension: Secondary | ICD-10-CM

## 2020-10-18 LAB — BASIC METABOLIC PANEL
BUN/Creatinine Ratio: 19 (ref 12–28)
BUN: 38 mg/dL — ABNORMAL HIGH (ref 8–27)
CO2: 24 mmol/L (ref 20–29)
Calcium: 9 mg/dL (ref 8.7–10.3)
Chloride: 95 mmol/L — ABNORMAL LOW (ref 96–106)
Creatinine, Ser: 1.99 mg/dL — ABNORMAL HIGH (ref 0.57–1.00)
GFR calc Af Amer: 26 mL/min/{1.73_m2} — ABNORMAL LOW (ref >59)
GFR calc non Af Amer: 22 mL/min/{1.73_m2} — ABNORMAL LOW (ref >59)
Glucose: 85 mg/dL (ref 65–99)
Potassium: 4.2 mmol/L (ref 3.5–5.2)
Sodium: 134 mmol/L (ref 134–144)

## 2020-10-24 ENCOUNTER — Telehealth: Payer: Self-pay

## 2020-10-24 MED ORDER — FUROSEMIDE 40 MG PO TABS
40.0000 mg | ORAL_TABLET | Freq: Every day | ORAL | 3 refills | Status: DC
Start: 2020-10-24 — End: 2021-11-30

## 2020-10-24 NOTE — Telephone Encounter (Signed)
Returned call to Pt.  Per Pt she increased lasix a few days, but it caused her to be in the bathroom too much and felt she was getting dehydrated.  She has returned to 40 mg by mouth daily (updated in chart)  Pt states that her breathing did NOT improve on the increased dose of lasix.  Advised would notify Dr. Lovena Le and call Pt back if he had any further instructions.

## 2020-11-03 DIAGNOSIS — N189 Chronic kidney disease, unspecified: Secondary | ICD-10-CM | POA: Diagnosis not present

## 2020-11-03 DIAGNOSIS — N1832 Chronic kidney disease, stage 3b: Secondary | ICD-10-CM | POA: Diagnosis not present

## 2020-11-03 DIAGNOSIS — D631 Anemia in chronic kidney disease: Secondary | ICD-10-CM | POA: Diagnosis not present

## 2020-11-03 DIAGNOSIS — I129 Hypertensive chronic kidney disease with stage 1 through stage 4 chronic kidney disease, or unspecified chronic kidney disease: Secondary | ICD-10-CM | POA: Diagnosis not present

## 2020-11-03 DIAGNOSIS — I4891 Unspecified atrial fibrillation: Secondary | ICD-10-CM | POA: Diagnosis not present

## 2020-11-03 DIAGNOSIS — I509 Heart failure, unspecified: Secondary | ICD-10-CM | POA: Diagnosis not present

## 2020-11-03 DIAGNOSIS — N2581 Secondary hyperparathyroidism of renal origin: Secondary | ICD-10-CM | POA: Diagnosis not present

## 2020-11-03 NOTE — Telephone Encounter (Signed)
Discussed with Dr. Lovena Le.  No changes at this time.  Have Pt f/u in 3 months.  F/u appt scheduled.

## 2020-11-17 ENCOUNTER — Other Ambulatory Visit: Payer: Self-pay | Admitting: Internal Medicine

## 2020-12-02 ENCOUNTER — Other Ambulatory Visit (HOSPITAL_COMMUNITY): Payer: Self-pay | Admitting: Internal Medicine

## 2020-12-02 NOTE — Telephone Encounter (Signed)
Pt last saw Dr Lovena Le 10/04/20, last labs 10/18/20 Creat 1.99, age 86, weight 55.7kg, based on specified criteria pt is on appropriate dosage of Eliquis 2.5mg  BID.  Will refill rx.

## 2020-12-13 ENCOUNTER — Ambulatory Visit: Payer: PPO

## 2021-01-17 DIAGNOSIS — M810 Age-related osteoporosis without current pathological fracture: Secondary | ICD-10-CM | POA: Diagnosis not present

## 2021-01-17 DIAGNOSIS — E785 Hyperlipidemia, unspecified: Secondary | ICD-10-CM | POA: Diagnosis not present

## 2021-01-17 DIAGNOSIS — E039 Hypothyroidism, unspecified: Secondary | ICD-10-CM | POA: Diagnosis not present

## 2021-01-19 ENCOUNTER — Encounter: Payer: PPO | Admitting: Internal Medicine

## 2021-01-23 ENCOUNTER — Ambulatory Visit: Payer: PPO | Admitting: Internal Medicine

## 2021-01-23 ENCOUNTER — Encounter: Payer: Self-pay | Admitting: Internal Medicine

## 2021-01-23 ENCOUNTER — Encounter (INDEPENDENT_AMBULATORY_CARE_PROVIDER_SITE_OTHER): Payer: Self-pay

## 2021-01-23 ENCOUNTER — Other Ambulatory Visit: Payer: Self-pay

## 2021-01-23 DIAGNOSIS — I442 Atrioventricular block, complete: Secondary | ICD-10-CM

## 2021-01-23 DIAGNOSIS — R001 Bradycardia, unspecified: Secondary | ICD-10-CM | POA: Diagnosis not present

## 2021-01-23 NOTE — Progress Notes (Signed)
HPI [name] returns today for followup. She is a pleasant 87 yo woman with a h/o CHB, s/p PPM Insertion with early RV lead failure and was then found to have an occluded left SCV. She had extraction of her atrial lead and insertion of a new RV lead but due to scar tissue could not get another atrial lead in place. 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 In the interim, her diuretic was reduced to 40 qam and 20 qpm and she feels better. She is taking her beta blocker but stopped taking the amiodarone. She denies chest pain or sob.  No Known Allergies   Current Outpatient Medications  Medication Sig Dispense Refill  . amiodarone (PACERONE) 200 MG tablet Take 0.5 tablets (100 mg total) by mouth daily. 45 tablet 1  . amLODipine (NORVASC) 2.5 MG tablet Take 2.5 mg by mouth daily.   3  . ELIQUIS 2.5 MG TABS tablet TAKE 1 TABLET BY MOUTH TWICE A DAY 60 tablet 5  . levothyroxine (SYNTHROID, LEVOTHROID) 75 MCG tablet Take 75 mcg by mouth daily before breakfast.    . metoprolol succinate (TOPROL-XL) 25 MG 24 hr tablet Take 25 mg by mouth daily.    . nitroGLYCERIN (NITROSTAT) 0.4 MG SL tablet Place 0.4 mg under the tongue every 5 (five) minutes x 3 doses as needed for chest pain.    Marland Kitchen SYNTHROID 75 MCG tablet Take 75 mcg by mouth daily.    . furosemide (LASIX) 40 MG tablet Take 1 tablet (40 mg total) by mouth daily. 90 tablet 3   No current facility-administered medications for this visit.     Past Medical History:  Diagnosis Date  . Arthritis    RA IN HANDS  . Chronic congestive heart failure with left ventricular diastolic dysfunction ([place])   . CKD (chronic kidney disease)   . Complete heart block ([place])   . Hypertension   . Hypoglycemia   . Hypothyroidism   . Nephrolithiasis   . Osteoporosis   . Persistent atrial fibrillation ([place])   . Presence of permanent cardiac pacemaker   . Thyroid disease   . Typical atrial flutter  ([facility])     ROS:   All systems reviewed and negative except as noted in the HPI.   Past Surgical History:  Procedure Laterality Date  . APPENDECTOMY    . CARDIOVERSION N/A 05/14/2017   Procedure: CARDIOVERSION;  Surgeon: [name], MD;  Location: [place];  Service: Cardiovascular;  Laterality: N/A;  . CARDIOVERSION N/A 07/23/2018   Procedure: CARDIOVERSION;  Surgeon: [name], MD;  Location: [facility] ENDOSCOPY;  Service: Cardiovascular;  Laterality: N/A;  . INSERT / REPLACE / REMOVE PACEMAKER    . LEAD REVISION/REPAIR N/A 06/09/2020   Procedure: LEAD REVISION/REPAIR;  Surgeon: [name], MD;  Location: [place] [facility];  Service: Cardiovascular;  Laterality: N/A;  . PACEMAKER IMPLANT N/A 06/06/2017   Procedure: Pacemaker Implant;  Surgeon: [name], MD;  Location: [place] [facility];  Service: Cardiovascular;  Laterality: N/A;  . PACEMAKER REVISION N/A 06/28/2020   Procedure: PACEMAKER SYSTEM REVISION;  Surgeon: Evans Lance, MD;  Location: [place] [facility];  Service: Cardiovascular;  Laterality: N/A;  BARTLE BACK UP  . PPM GENERATOR REMOVAL N/A 06/09/2020   Procedure: PPM GENERATOR REMOVAL;  Surgeon: [name], MD;  Location: [place] [facility];  Service: Cardiovascular;  Laterality: N/A;  Family History  Problem Relation Age of Onset  . Breast cancer Mother   . Lung cancer Father      Social History   Socioeconomic History  . Marital status: Married    Spouse name: Not on file  . Number of children: 1  . Years of education: Not on file  . Highest education level: Not on file  Occupational History  . Not on file  Tobacco Use  . Smoking status: Former Smoker    Types: Cigarettes    Quit date: 05/18/1969    Years since quitting: 51.7  . Smokeless tobacco: Never Used  Vaping Use  . Vaping Use: Never used  Substance and Sexual Activity  . Alcohol use: No  . Drug use: No  . Sexual activity: Not on file  Other Topics Concern  . Not on  file  Social History Narrative   Lives in [place] with her spouse.  Retired   Investment banker, operational of Radio broadcast assistant Strain: Not on [employer] Insecurity: Not on file  Transportation Needs: Not on file  Physical Activity: Not on file  Stress: Not on file  Social Connections: Not on file  Intimate Partner Violence: Not on file     BP 122/70   Pulse 87   Ht 5\' 2"  (1.575 m)   Wt 119 lb 3.2 oz (54.1 kg)   LMP  (LMP Unknown)   SpO2 97%   BMI 21.80 kg/m   Physical Exam:  Well appearing NAD HEENT: Unremarkable Neck:  No JVD, no thyromegally Lymphatics:  No adenopathy Back:  No CVA tenderness Lungs:  Clear with no wheezes HEART:  Regular rate rhythm, no murmurs, no rubs, no clicks Abd:  soft, positive bowel sounds, no organomegally, no rebound, no guarding Ext:  2 plus pulses, no edema, no cyanosis, no clubbing Skin:  No rashes no nodules Neuro:  CN II through XII intact, motor grossly intact  EKG - probable atrial fib with ventricular pacing  DEVICE  Normal device function.  See PaceArt for details.   Assess/Plan: 1. CHB - she is stable after PPM revision. 2. PPM - she has a single lead in the ventricle. Her single medtronic PPM is working normally 3. Probable PM syndrome - she appears to have reverted to atrial fib and seems to feel better.  4. HTN -her sbp is better. She will maintain a low sodium diet.  Carleene Overlie Amai Cappiello,MD

## 2021-01-23 NOTE — Patient Instructions (Signed)

## 2021-01-24 DIAGNOSIS — M810 Age-related osteoporosis without current pathological fracture: Secondary | ICD-10-CM | POA: Diagnosis not present

## 2021-01-24 DIAGNOSIS — I10 Essential (primary) hypertension: Secondary | ICD-10-CM | POA: Diagnosis not present

## 2021-01-24 DIAGNOSIS — I4891 Unspecified atrial fibrillation: Secondary | ICD-10-CM | POA: Diagnosis not present

## 2021-01-24 DIAGNOSIS — Z0000 Encounter for general adult medical examination without abnormal findings: Secondary | ICD-10-CM | POA: Diagnosis not present

## 2021-01-24 DIAGNOSIS — R82998 Other abnormal findings in urine: Secondary | ICD-10-CM | POA: Diagnosis not present

## 2021-01-24 DIAGNOSIS — N1832 Chronic kidney disease, stage 3b: Secondary | ICD-10-CM | POA: Diagnosis not present

## 2021-01-24 DIAGNOSIS — H903 Sensorineural hearing loss, bilateral: Secondary | ICD-10-CM | POA: Diagnosis not present

## 2021-01-24 DIAGNOSIS — E039 Hypothyroidism, unspecified: Secondary | ICD-10-CM | POA: Diagnosis not present

## 2021-01-24 DIAGNOSIS — E785 Hyperlipidemia, unspecified: Secondary | ICD-10-CM | POA: Diagnosis not present

## 2021-01-24 DIAGNOSIS — I442 Atrioventricular block, complete: Secondary | ICD-10-CM | POA: Diagnosis not present

## 2021-01-24 DIAGNOSIS — I701 Atherosclerosis of renal artery: Secondary | ICD-10-CM | POA: Diagnosis not present

## 2021-01-27 LAB — CUP PACEART INCLINIC DEVICE CHECK
Battery Remaining Longevity: 150 mo
Battery Voltage: 3.17 V
Brady Statistic AP VP Percent: 0 %
Brady Statistic AP VS Percent: 0 %
Brady Statistic AS VP Percent: 99.99 %
Brady Statistic AS VS Percent: 0.01 %
Brady Statistic RA Percent Paced: 0 %
Brady Statistic RV Percent Paced: 99.99 %
Date Time Interrogation Session: 20220207142300
Implantable Lead Implant Date: 20210713
Implantable Lead Location: 753860
Implantable Lead Model: 5076
Implantable Pulse Generator Implant Date: 20210713
Lead Channel Impedance Value: 3439 Ohm
Lead Channel Impedance Value: 3439 Ohm
Lead Channel Impedance Value: 418 Ohm
Lead Channel Impedance Value: 475 Ohm
Lead Channel Pacing Threshold Amplitude: 0.75 V
Lead Channel Pacing Threshold Pulse Width: 0.4 ms
Lead Channel Setting Pacing Amplitude: 2 V
Lead Channel Setting Pacing Pulse Width: 0.4 ms
Lead Channel Setting Sensing Sensitivity: 0.9 mV

## 2021-02-03 ENCOUNTER — Emergency Department (HOSPITAL_COMMUNITY): Payer: PPO

## 2021-02-03 ENCOUNTER — Emergency Department (HOSPITAL_COMMUNITY)
Admission: EM | Admit: 2021-02-03 | Discharge: 2021-02-03 | Disposition: A | Discharge status: Home / Self Care | Payer: PPO | Attending: Emergency Medicine | Admitting: Emergency Medicine

## 2021-02-03 ENCOUNTER — Encounter (HOSPITAL_COMMUNITY): Payer: Self-pay | Admitting: Emergency Medicine

## 2021-02-03 DIAGNOSIS — Y92512 Supermarket, store or market as the place of occurrence of the external cause: Secondary | ICD-10-CM | POA: Insufficient documentation

## 2021-02-03 DIAGNOSIS — S0003XA Contusion of scalp, initial encounter: Secondary | ICD-10-CM | POA: Diagnosis not present

## 2021-02-03 DIAGNOSIS — W19XXXA Unspecified fall, initial encounter: Secondary | ICD-10-CM

## 2021-02-03 DIAGNOSIS — E039 Hypothyroidism, unspecified: Secondary | ICD-10-CM | POA: Diagnosis not present

## 2021-02-03 DIAGNOSIS — R9082 White matter disease, unspecified: Secondary | ICD-10-CM | POA: Diagnosis not present

## 2021-02-03 DIAGNOSIS — Z950 Presence of cardiac pacemaker: Secondary | ICD-10-CM | POA: Diagnosis not present

## 2021-02-03 DIAGNOSIS — I130 Hypertensive heart and chronic kidney disease with heart failure and stage 1 through stage 4 chronic kidney disease, or unspecified chronic kidney disease: Secondary | ICD-10-CM | POA: Insufficient documentation

## 2021-02-03 DIAGNOSIS — Z87891 Personal history of nicotine dependence: Secondary | ICD-10-CM | POA: Insufficient documentation

## 2021-02-03 DIAGNOSIS — S0990XA Unspecified injury of head, initial encounter: Secondary | ICD-10-CM

## 2021-02-03 DIAGNOSIS — W51XXXA Accidental striking against or bumped into by another person, initial encounter: Secondary | ICD-10-CM | POA: Insufficient documentation

## 2021-02-03 DIAGNOSIS — I509 Heart failure, unspecified: Secondary | ICD-10-CM | POA: Insufficient documentation

## 2021-02-03 DIAGNOSIS — N189 Chronic kidney disease, unspecified: Secondary | ICD-10-CM | POA: Insufficient documentation

## 2021-02-03 LAB — PROTIME-INR
INR: 1.1 (ref 0.8–1.2)
Prothrombin Time: 14 seconds (ref 11.4–15.2)

## 2021-02-03 LAB — CBC
HCT: 40.4 % (ref 36.0–46.0)
Hemoglobin: 12.9 g/dL (ref 12.0–15.0)
MCH: 28 pg (ref 26.0–34.0)
MCHC: 31.9 g/dL (ref 30.0–36.0)
MCV: 87.8 fL (ref 80.0–100.0)
Platelets: 216 10*3/uL (ref 150–400)
RBC: 4.6 MIL/uL (ref 3.87–5.11)
RDW: 17.5 % — ABNORMAL HIGH (ref 11.5–15.5)
WBC: 7.3 10*3/uL (ref 4.0–10.5)
nRBC: 0 % (ref 0.0–0.2)

## 2021-02-03 LAB — COMPREHENSIVE METABOLIC PANEL
ALT: 19 U/L (ref 0–44)
AST: 33 U/L (ref 15–41)
Albumin: 3.9 g/dL (ref 3.5–5.0)
Alkaline Phosphatase: 85 U/L (ref 38–126)
Anion gap: 12 (ref 5–15)
BUN: 35 mg/dL — ABNORMAL HIGH (ref 8–23)
CO2: 24 mmol/L (ref 22–32)
Calcium: 9.3 mg/dL (ref 8.9–10.3)
Chloride: 97 mmol/L — ABNORMAL LOW (ref 98–111)
Creatinine, Ser: 1.78 mg/dL — ABNORMAL HIGH (ref 0.44–1.00)
GFR, Estimated: 27 mL/min — ABNORMAL LOW (ref >60)
Glucose, Bld: 204 mg/dL — ABNORMAL HIGH (ref 70–99)
Potassium: 4.1 mmol/L (ref 3.5–5.1)
Sodium: 133 mmol/L — ABNORMAL LOW (ref 135–145)
Total Bilirubin: 1.4 mg/dL — ABNORMAL HIGH (ref 0.3–1.2)
Total Protein: 7 g/dL (ref 6.5–8.1)

## 2021-02-03 NOTE — Discharge Instructions (Addendum)
You were seen in the emerge department today after fall with head injury.  Your lab work is reassuring.  The CT scan of your head and neck are normal and do not show bleeding or fractures.  You may notice some development of bruising in your arm and the back of your head.  You can apply a cool compress to the area and take Tylenol as needed for pain.  Please follow-up with your primary care doctor in the coming week.  Return to the emergency department any new or suddenly worsening symptoms

## 2021-02-03 NOTE — ED Provider Notes (Signed)
Emergency Department Provider Note   I have reviewed the triage vital signs and the nursing notes.   HISTORY  Chief Complaint Fall   HPI Brandi Chambers is a 87 y.o. female with past medical history reviewed below presents to the emergency department after fall with head injury.  By protocol she was activated as a level 2 trauma from triage.  Patient was at a bookstore and someone came around the corner and knocked into her accidentally.  Caused her to fall backwards striking the back of her head but she did not lose consciousness.  She is noticed some bruising to her right elbow but no pain with touching the area or moving the elbow, wrist, shoulder.  Denies any neck pain.  No numbness or weakness.  No vision changes.  Patient has history of A. fib and is on Eliquis.  She has a pacemaker.  She is not feeling weakness or lightheadedness.  Past Medical History:  Diagnosis Date  . Arthritis    RA IN HANDS  . Chronic congestive heart failure with left ventricular diastolic dysfunction ([place])   . CKD (chronic kidney disease)   . Complete heart block ([place])   . Hypertension   . Hypoglycemia   . Hypothyroidism   . Nephrolithiasis   . Osteoporosis   . Persistent atrial fibrillation ([place])   . Presence of permanent cardiac pacemaker   . Thyroid disease   . Typical atrial flutter [facility])     Patient Active Problem List   Diagnosis Date Noted  . Complete heart block ([place]) 10/04/2020  . Pacemaker lead failure, sequela 06/28/2020  . Pacemaker lead failure 06/17/2020  . Pacemaker 05/11/2020  . Sinus brady-tachy syndrome ([place])   . Persistent atrial fibrillation ([place])   . Epistaxis 10/25/2013  . Hypertension 10/25/2013  . Symptomatic bradycardia 08/05/2013  . Chest pressure 08/05/2013  . SVT (supraventricular tachycardia) ([place]) 07/26/2013  . Heart palpitations 05/18/2013  . Essential hypertension 05/18/2013  . Hypothyroid 05/18/2013  . Weakness 05/18/2013    Past Surgical History:   Procedure Laterality Date  . APPENDECTOMY    . CARDIOVERSION N/A 05/14/2017   Procedure: CARDIOVERSION;  Surgeon: [name], MD;  Location: [place];  Service: Cardiovascular;  Laterality: N/A;  . CARDIOVERSION N/A 07/23/2018   Procedure: CARDIOVERSION;  Surgeon: [name], MD;  Location: [facility] ENDOSCOPY;  Service: Cardiovascular;  Laterality: N/A;  . INSERT / REPLACE / REMOVE PACEMAKER    . LEAD REVISION/REPAIR N/A 06/09/2020   Procedure: LEAD REVISION/REPAIR;  Surgeon: [name], MD;  Location: [place] CV LAB;  Service: Cardiovascular;  Laterality: N/A;  . PACEMAKER IMPLANT N/A 06/06/2017   Procedure: Pacemaker Implant;  Surgeon: [name], MD;  Location: [place] CV LAB;  Service: Cardiovascular;  Laterality: N/A;  . PACEMAKER REVISION N/A 06/28/2020   Procedure: PACEMAKER SYSTEM REVISION;  Surgeon: Evans Lance, MD;  Location: [place] CV LAB;  Service: Cardiovascular;  Laterality: N/A;  BARTLE BACK UP  . PPM GENERATOR REMOVAL N/A 06/09/2020   Procedure: PPM GENERATOR REMOVAL;  Surgeon: [name], MD;  Location: [place] CV LAB;  Service: Cardiovascular;  Laterality: N/A;    Allergies Patient has no known allergies.  Family History  Problem Relation Age of Onset  . Breast cancer Mother   . Lung cancer Father     Social History Social History   Tobacco Use  . Smoking status: Former Smoker    Types: Cigarettes    Quit date: 05/18/1969  Years since quitting: 51.7  . Smokeless tobacco: Never Used  Vaping Use  . Vaping Use: Never used  Substance Use Topics  . Alcohol use: No  . Drug use: No    Review of Systems  Constitutional: No fever/chills Eyes: No visual changes. ENT: No sore throat. Cardiovascular: Denies chest pain. Respiratory: Denies shortness of breath. Gastrointestinal: No abdominal pain.  No nausea, no vomiting.  No diarrhea.  No constipation. Genitourinary: Negative for dysuria. Musculoskeletal: Negative for back  pain. Skin: Negative for rash. Bruising to the right elbow.  Neurological: Negative for focal weakness or numbness. Posterior HA.   10-point ROS otherwise negative.  ____________________________________________   PHYSICAL EXAM:  VITAL SIGNS: ED Triage Vitals [02/03/21 1407]  Enc Vitals Group     BP (!) 176/97     Pulse Rate 82     Resp 14     Temp (!) 97.5 F (36.4 C)     Temp Source Oral     SpO2 100 %   Constitutional: Alert and oriented. Well appearing and in no acute distress. Eyes: Conjunctivae are normal.  Head: Mild bruising with small hematoma to the occipital scalp. No laceration.  Nose: No congestion/rhinnorhea. Mouth/Throat: Mucous membranes are moist.   Neck: No stridor.  No cervical spine tenderness to palpation. Cardiovascular: Normal rate, regular rhythm. Good peripheral circulation. Grossly normal heart sounds.   Respiratory: Normal respiratory effort.  No retractions. Lungs CTAB. Gastrointestinal: Soft and nontender. No distention.  Musculoskeletal: No lower extremity tenderness nor edema. No gross deformities of extremities. Normal/full ROM of the right shoulder, elbow, and wrist.  Mild bruising noted to the right lateral elbow. No laceration.  Neurologic:  Normal speech and language. No gross focal neurologic deficits are appreciated.  Skin:  Skin is warm, dry and intact. No rash noted. Bruising as above. No laceration.    ____________________________________________   LABS (all labs ordered are listed, but only abnormal results are displayed)  Labs Reviewed  COMPREHENSIVE METABOLIC PANEL - Abnormal; Notable for the following components:      Result Value   Sodium 133 (*)    Chloride 97 (*)    Glucose, Bld 204 (*)    BUN 35 (*)    Creatinine, Ser 1.78 (*)    Total Bilirubin 1.4 (*)    GFR, Estimated 27 (*)    All other components within normal limits  CBC - Abnormal; Notable for the following components:   RDW 17.5 (*)    All other components  within normal limits  PROTIME-INR  I-STAT CHEM 8, ED   ____________________________________________  RADIOLOGY  CT HEAD WO CONTRAST  Result Date: 02/03/2021 CLINICAL DATA:  Fall, anticoagulation EXAM: CT HEAD WITHOUT CONTRAST CT CERVICAL SPINE WITHOUT CONTRAST TECHNIQUE: Multidetector CT imaging of the head and cervical spine was performed following the standard protocol without intravenous contrast. Multiplanar CT image reconstructions of the cervical spine were also generated. COMPARISON:  07/19/2020 FINDINGS: CT HEAD FINDINGS Brain: No evidence of acute infarction, hemorrhage, hydrocephalus, extra-axial collection or mass lesion/mass effect. Periventricular and deep white matter hypodensity. Vascular: No hyperdense vessel or unexpected calcification. Skull: Normal. Negative for fracture or focal lesion. Sinuses/Orbits: No acute finding. Other: None. CT CERVICAL SPINE FINDINGS Alignment: Normal. Skull base and vertebrae: No acute fracture. No primary bone lesion or focal pathologic process. Soft tissues and spinal canal: No prevertebral fluid or swelling. No visible canal hematoma. Disc levels: Mild multilevel disc space height loss and osteophytosis. Upper chest: Negative. Other: None. IMPRESSION: 1. No  acute intracranial pathology. Small-vessel white matter disease. 2. No fracture or static subluxation of the cervical spine. 3. Mild multilevel disc space height loss and osteophytosis. Electronically Signed   By: Eddie Candle M.D.   On: 02/03/2021 14:40   CT CERVICAL SPINE WO CONTRAST  Result Date: 02/03/2021 CLINICAL DATA:  Fall, anticoagulation EXAM: CT HEAD WITHOUT CONTRAST CT CERVICAL SPINE WITHOUT CONTRAST TECHNIQUE: Multidetector CT imaging of the head and cervical spine was performed following the standard protocol without intravenous contrast. Multiplanar CT image reconstructions of the cervical spine were also generated. COMPARISON:  07/19/2020 FINDINGS: CT HEAD FINDINGS Brain: No evidence  of acute infarction, hemorrhage, hydrocephalus, extra-axial collection or mass lesion/mass effect. Periventricular and deep white matter hypodensity. Vascular: No hyperdense vessel or unexpected calcification. Skull: Normal. Negative for fracture or focal lesion. Sinuses/Orbits: No acute finding. Other: None. CT CERVICAL SPINE FINDINGS Alignment: Normal. Skull base and vertebrae: No acute fracture. No primary bone lesion or focal pathologic process. Soft tissues and spinal canal: No prevertebral fluid or swelling. No visible canal hematoma. Disc levels: Mild multilevel disc space height loss and osteophytosis. Upper chest: Negative. Other: None. IMPRESSION: 1. No acute intracranial pathology. Small-vessel white matter disease. 2. No fracture or static subluxation of the cervical spine. 3. Mild multilevel disc space height loss and osteophytosis. Electronically Signed   By: Eddie Candle M.D.   On: 02/03/2021 14:40    ____________________________________________   PROCEDURES  Procedure(s) performed:   Procedures  None  ____________________________________________   INITIAL IMPRESSION / ASSESSMENT AND PLAN / ED COURSE  Pertinent labs & imaging results that were available during my care of the patient were reviewed by me and considered in my medical decision making (see chart for details).   Patient arrives to the emergency department by private vehicle after being knocked backwards while at a bookstore.  By protocol she was activated as a level 2 trauma from triage and taken directly to CT after initial evaluation by the PA.  I evaluated the patient immediately upon return from CT.  There are no acute findings on her head imaging which I also reviewed independently.  Labs were drawn prior to my evaluation and so we will wait on these results.  She has some bruising of the right elbow but no pain.  No tenderness to palpation over the joints and has normal range of motion. No imaging of this area for  now. Advised RICE treatment at home and to alert the PCP regarding the fall and ED visit.   CT head and c-spine negative. Normal labs. Patient feeling well and ready for discharge.  ____________________________________________  FINAL CLINICAL IMPRESSION(S) / ED DIAGNOSES  Final diagnoses:  Injury of head, initial encounter  Fall, initial encounter    Note:  This document was prepared using Dragon voice recognition software and may include unintentional dictation errors.  Nanda Quinton, MD, [facility] Emergency Medicine    Moncerrat Burnstein, Wonda Olds, MD 02/04/21 [phone]

## 2021-02-03 NOTE — Progress Notes (Signed)
Orthopedic Tech Progress Note Patient Details:  Brandi Chambers 1932/08/29 [id number] Level 2 trauma Patient ID: Brandi Chambers, female   DOB: 1932-05-26, 87 y.o.   MRN: [id number]   Brandi Chambers 02/03/2021, 2:57 PM

## 2021-02-03 NOTE — ED Triage Notes (Signed)
Pt was in a book store reading a book when someone came around the corner and knocked her down causing her to fall back wards and hit her head. She does have hematoma to back of head and bruise to right arm.

## 2021-02-03 NOTE — ED Triage Notes (Signed)
Emergency Medicine Provider Triage Evaluation Note  Brandi Chambers , a 87 y.o. female  was evaluated in triage.  Pt complains of mechanical fall. She reports she was at a bookstore when someone came around the corner and knocked her over causing her to fall striking the back of her head. She takes Eliquis. She denies loss of consciousness. She has been ambulatory  Review of Systems  Positive: Posterior headache Negative: Loss of consciousness, neck pain  Physical Exam  BP (!) 176/97 (BP Location: Right Arm)   Pulse 82   Temp (!) 97.5 F (36.4 C) (Oral)   Resp 14   LMP  (LMP Unknown)   SpO2 100%  Gen:   Awake, no distress   HEENT:  Contusion over the posterior aspect of the head Resp:  Normal effort  Cardiac:  Normal rate  Abd:   Nondistended, nontender  MSK:   Moves extremities without difficulty, ambulatory into ER Neuro:  Speech clear, she is able to answer questions  Medical Decision Making  Medically screening exam initiated at 2:11 PM.  Appropriate orders placed.  SKILA ROLLINS was informed that the remainder of the evaluation will be completed by another provider, this initial triage assessment does not replace that evaluation, and the importance of remaining in the ED until their evaluation is complete.  1414: Spoke with Sarina Ser, asked them to page outpatient as a level 2 trauma. CT has already been called and orders are in place for CT head and neck. Charge is aware.   Clinical Impression  Level 2 trauma, fall on thinners.    Lorin Glass, [place] 02/03/21 1417

## 2021-02-07 ENCOUNTER — Telehealth: Payer: Self-pay

## 2021-02-07 NOTE — Addendum Note (Signed)
Addended by: Willeen Cass A on: 02/07/2021 01:48 PM   Modules accepted: Orders

## 2021-02-07 NOTE — Telephone Encounter (Signed)
Patient called in to let us know her monitor is no longer working and the company is sending her a new one and it will be here in 7-10 business days.

## 2021-02-12 ENCOUNTER — Other Ambulatory Visit: Payer: Self-pay | Admitting: Internal Medicine

## 2021-02-14 LAB — CUP PACEART REMOTE DEVICE CHECK
Battery Remaining Longevity: 151 mo
Battery Voltage: 3.17 V
Brady Statistic AP VP Percent: 0 %
Brady Statistic AP VS Percent: 0 %
Brady Statistic AS VP Percent: 99.99 %
Brady Statistic AS VS Percent: 0.01 %
Brady Statistic RA Percent Paced: 0 %
Brady Statistic RV Percent Paced: 99.99 %
Date Time Interrogation Session: 20220301095127
Implantable Lead Implant Date: 20210713
Implantable Lead Location: 753860
Implantable Lead Model: 5076
Implantable Pulse Generator Implant Date: 20210713
Lead Channel Impedance Value: 3439 Ohm
Lead Channel Impedance Value: 3439 Ohm
Lead Channel Impedance Value: 418 Ohm
Lead Channel Impedance Value: 513 Ohm
Lead Channel Pacing Threshold Amplitude: 0.75 V
Lead Channel Pacing Threshold Pulse Width: 0.4 ms
Lead Channel Setting Pacing Amplitude: 2 V
Lead Channel Setting Pacing Pulse Width: 0.4 ms
Lead Channel Setting Sensing Sensitivity: 0.9 mV

## 2021-02-15 ENCOUNTER — Ambulatory Visit (INDEPENDENT_AMBULATORY_CARE_PROVIDER_SITE_OTHER): Payer: PPO

## 2021-02-15 DIAGNOSIS — I442 Atrioventricular block, complete: Secondary | ICD-10-CM

## 2021-02-24 NOTE — Progress Notes (Signed)
Remote pacemaker transmission.   

## 2021-03-01 DIAGNOSIS — C44329 Squamous cell carcinoma of skin of other parts of face: Secondary | ICD-10-CM | POA: Diagnosis not present

## 2021-03-01 DIAGNOSIS — L570 Actinic keratosis: Secondary | ICD-10-CM | POA: Diagnosis not present

## 2021-03-01 DIAGNOSIS — D485 Neoplasm of uncertain behavior of skin: Secondary | ICD-10-CM | POA: Diagnosis not present

## 2021-03-01 DIAGNOSIS — L905 Scar conditions and fibrosis of skin: Secondary | ICD-10-CM | POA: Diagnosis not present

## 2021-03-01 DIAGNOSIS — Z85828 Personal history of other malignant neoplasm of skin: Secondary | ICD-10-CM | POA: Diagnosis not present

## 2021-03-01 DIAGNOSIS — C44319 Basal cell carcinoma of skin of other parts of face: Secondary | ICD-10-CM | POA: Diagnosis not present

## 2021-03-07 ENCOUNTER — Other Ambulatory Visit: Payer: Self-pay | Admitting: Internal Medicine

## 2021-03-14 LAB — CUP PACEART REMOTE DEVICE CHECK
Battery Remaining Longevity: 149 mo
Battery Voltage: 3.15 V
Brady Statistic AP VP Percent: 0 %
Brady Statistic AP VS Percent: 0 %
Brady Statistic AS VP Percent: 99.99 %
Brady Statistic AS VS Percent: 0.01 %
Brady Statistic RA Percent Paced: 0 %
Brady Statistic RV Percent Paced: 99.99 %
Date Time Interrogation Session: 20220329004930
Implantable Lead Implant Date: 20210713
Implantable Lead Location: 753860
Implantable Lead Model: 5076
Implantable Pulse Generator Implant Date: 20210713
Lead Channel Impedance Value: 3439 Ohm
Lead Channel Impedance Value: 3439 Ohm
Lead Channel Impedance Value: 380 Ohm
Lead Channel Impedance Value: 475 Ohm
Lead Channel Pacing Threshold Amplitude: 0.75 V
Lead Channel Pacing Threshold Pulse Width: 0.4 ms
Lead Channel Setting Pacing Amplitude: 2 V
Lead Channel Setting Pacing Pulse Width: 0.4 ms
Lead Channel Setting Sensing Sensitivity: 0.9 mV

## 2021-03-16 DIAGNOSIS — H02834 Dermatochalasis of left upper eyelid: Secondary | ICD-10-CM | POA: Diagnosis not present

## 2021-03-16 DIAGNOSIS — H02831 Dermatochalasis of right upper eyelid: Secondary | ICD-10-CM | POA: Diagnosis not present

## 2021-03-16 DIAGNOSIS — H40053 Ocular hypertension, bilateral: Secondary | ICD-10-CM | POA: Diagnosis not present

## 2021-03-16 DIAGNOSIS — H2513 Age-related nuclear cataract, bilateral: Secondary | ICD-10-CM | POA: Diagnosis not present

## 2021-03-16 DIAGNOSIS — H04123 Dry eye syndrome of bilateral lacrimal glands: Secondary | ICD-10-CM | POA: Diagnosis not present

## 2021-04-13 DIAGNOSIS — I129 Hypertensive chronic kidney disease with stage 1 through stage 4 chronic kidney disease, or unspecified chronic kidney disease: Secondary | ICD-10-CM | POA: Diagnosis not present

## 2021-04-13 DIAGNOSIS — I4891 Unspecified atrial fibrillation: Secondary | ICD-10-CM | POA: Diagnosis not present

## 2021-04-13 DIAGNOSIS — N189 Chronic kidney disease, unspecified: Secondary | ICD-10-CM | POA: Diagnosis not present

## 2021-04-13 DIAGNOSIS — N2581 Secondary hyperparathyroidism of renal origin: Secondary | ICD-10-CM | POA: Diagnosis not present

## 2021-04-13 DIAGNOSIS — I509 Heart failure, unspecified: Secondary | ICD-10-CM | POA: Diagnosis not present

## 2021-04-13 DIAGNOSIS — N1832 Chronic kidney disease, stage 3b: Secondary | ICD-10-CM | POA: Diagnosis not present

## 2021-04-13 DIAGNOSIS — D631 Anemia in chronic kidney disease: Secondary | ICD-10-CM | POA: Diagnosis not present

## 2021-04-13 DIAGNOSIS — I701 Atherosclerosis of renal artery: Secondary | ICD-10-CM | POA: Diagnosis not present

## 2021-04-20 DIAGNOSIS — Z85828 Personal history of other malignant neoplasm of skin: Secondary | ICD-10-CM | POA: Diagnosis not present

## 2021-04-20 DIAGNOSIS — L905 Scar conditions and fibrosis of skin: Secondary | ICD-10-CM | POA: Diagnosis not present

## 2021-05-17 ENCOUNTER — Ambulatory Visit (INDEPENDENT_AMBULATORY_CARE_PROVIDER_SITE_OTHER): Payer: PPO

## 2021-05-17 DIAGNOSIS — I442 Atrioventricular block, complete: Secondary | ICD-10-CM | POA: Diagnosis not present

## 2021-05-18 LAB — CUP PACEART REMOTE DEVICE CHECK
Battery Remaining Longevity: 147 mo
Battery Voltage: 3.12 V
Brady Statistic AP VP Percent: 0 %
Brady Statistic AP VS Percent: 0 %
Brady Statistic AS VP Percent: 99.98 %
Brady Statistic AS VS Percent: 0.02 %
Brady Statistic RA Percent Paced: 0 %
Brady Statistic RV Percent Paced: 99.98 %
Date Time Interrogation Session: 20220601005020
Implantable Lead Implant Date: 20210713
Implantable Lead Location: 753860
Implantable Lead Model: 5076
Implantable Pulse Generator Implant Date: 20210713
Lead Channel Impedance Value: 3439 Ohm
Lead Channel Impedance Value: 3439 Ohm
Lead Channel Impedance Value: 399 Ohm
Lead Channel Impedance Value: 475 Ohm
Lead Channel Pacing Threshold Amplitude: 0.875 V
Lead Channel Pacing Threshold Pulse Width: 0.4 ms
Lead Channel Setting Pacing Amplitude: 2 V
Lead Channel Setting Pacing Pulse Width: 0.4 ms
Lead Channel Setting Sensing Sensitivity: 0.9 mV

## 2021-06-09 ENCOUNTER — Other Ambulatory Visit (HOSPITAL_COMMUNITY): Payer: Self-pay | Admitting: Internal Medicine

## 2021-06-09 NOTE — Progress Notes (Signed)
Remote pacemaker transmission.   

## 2021-06-23 DIAGNOSIS — I5032 Chronic diastolic (congestive) heart failure: Secondary | ICD-10-CM | POA: Diagnosis not present

## 2021-06-23 DIAGNOSIS — E876 Hypokalemia: Secondary | ICD-10-CM | POA: Diagnosis not present

## 2021-06-23 DIAGNOSIS — R197 Diarrhea, unspecified: Secondary | ICD-10-CM | POA: Diagnosis not present

## 2021-06-23 DIAGNOSIS — K922 Gastrointestinal hemorrhage, unspecified: Secondary | ICD-10-CM | POA: Diagnosis not present

## 2021-06-23 DIAGNOSIS — E871 Hypo-osmolality and hyponatremia: Secondary | ICD-10-CM | POA: Diagnosis not present

## 2021-06-23 DIAGNOSIS — K921 Melena: Secondary | ICD-10-CM | POA: Diagnosis not present

## 2021-06-27 DIAGNOSIS — I5032 Chronic diastolic (congestive) heart failure: Secondary | ICD-10-CM | POA: Diagnosis not present

## 2021-06-27 DIAGNOSIS — E876 Hypokalemia: Secondary | ICD-10-CM | POA: Diagnosis not present

## 2021-06-27 DIAGNOSIS — N1832 Chronic kidney disease, stage 3b: Secondary | ICD-10-CM | POA: Diagnosis not present

## 2021-06-27 DIAGNOSIS — I130 Hypertensive heart and chronic kidney disease with heart failure and stage 1 through stage 4 chronic kidney disease, or unspecified chronic kidney disease: Secondary | ICD-10-CM | POA: Diagnosis not present

## 2021-06-27 DIAGNOSIS — K921 Melena: Secondary | ICD-10-CM | POA: Diagnosis not present

## 2021-06-27 DIAGNOSIS — U071 COVID-19: Secondary | ICD-10-CM | POA: Diagnosis not present

## 2021-06-27 DIAGNOSIS — Z1152 Encounter for screening for COVID-19: Secondary | ICD-10-CM | POA: Diagnosis not present

## 2021-06-27 DIAGNOSIS — I4891 Unspecified atrial fibrillation: Secondary | ICD-10-CM | POA: Diagnosis not present

## 2021-06-27 DIAGNOSIS — R110 Nausea: Secondary | ICD-10-CM | POA: Diagnosis not present

## 2021-06-27 DIAGNOSIS — E871 Hypo-osmolality and hyponatremia: Secondary | ICD-10-CM | POA: Diagnosis not present

## 2021-06-27 DIAGNOSIS — R051 Acute cough: Secondary | ICD-10-CM | POA: Diagnosis not present

## 2021-06-27 DIAGNOSIS — K922 Gastrointestinal hemorrhage, unspecified: Secondary | ICD-10-CM | POA: Diagnosis not present

## 2021-07-04 DIAGNOSIS — E876 Hypokalemia: Secondary | ICD-10-CM | POA: Diagnosis not present

## 2021-07-04 DIAGNOSIS — E871 Hypo-osmolality and hyponatremia: Secondary | ICD-10-CM | POA: Diagnosis not present

## 2021-08-02 DIAGNOSIS — E785 Hyperlipidemia, unspecified: Secondary | ICD-10-CM | POA: Diagnosis not present

## 2021-08-16 ENCOUNTER — Ambulatory Visit (INDEPENDENT_AMBULATORY_CARE_PROVIDER_SITE_OTHER): Payer: PPO

## 2021-08-16 DIAGNOSIS — I442 Atrioventricular block, complete: Secondary | ICD-10-CM

## 2021-08-22 LAB — CUP PACEART REMOTE DEVICE CHECK
Battery Remaining Longevity: 142 mo
Battery Voltage: 3.07 V
Brady Statistic AP VP Percent: 0 %
Brady Statistic AP VS Percent: 0 %
Brady Statistic AS VP Percent: 99.8 %
Brady Statistic AS VS Percent: 0.2 %
Brady Statistic RA Percent Paced: 0 %
Brady Statistic RV Percent Paced: 99.8 %
Date Time Interrogation Session: 20220902003158
Implantable Lead Implant Date: 20210713
Implantable Lead Location: 753860
Implantable Lead Model: 5076
Implantable Pulse Generator Implant Date: 20210713
Lead Channel Impedance Value: 3439 Ohm
Lead Channel Impedance Value: 3439 Ohm
Lead Channel Impedance Value: 380 Ohm
Lead Channel Impedance Value: 437 Ohm
Lead Channel Pacing Threshold Amplitude: 0.875 V
Lead Channel Pacing Threshold Pulse Width: 0.4 ms
Lead Channel Sensing Intrinsic Amplitude: 14.125 mV
Lead Channel Sensing Intrinsic Amplitude: 14.125 mV
Lead Channel Setting Pacing Amplitude: 2 V
Lead Channel Setting Pacing Pulse Width: 0.4 ms
Lead Channel Setting Sensing Sensitivity: 0.9 mV

## 2021-08-29 DIAGNOSIS — Z08 Encounter for follow-up examination after completed treatment for malignant neoplasm: Secondary | ICD-10-CM | POA: Diagnosis not present

## 2021-08-29 DIAGNOSIS — C44329 Squamous cell carcinoma of skin of other parts of face: Secondary | ICD-10-CM | POA: Diagnosis not present

## 2021-08-29 DIAGNOSIS — Z85828 Personal history of other malignant neoplasm of skin: Secondary | ICD-10-CM | POA: Diagnosis not present

## 2021-08-29 NOTE — Progress Notes (Signed)
Remote pacemaker transmission.   

## 2021-09-25 DIAGNOSIS — I129 Hypertensive chronic kidney disease with stage 1 through stage 4 chronic kidney disease, or unspecified chronic kidney disease: Secondary | ICD-10-CM | POA: Diagnosis not present

## 2021-09-25 DIAGNOSIS — N2581 Secondary hyperparathyroidism of renal origin: Secondary | ICD-10-CM | POA: Diagnosis not present

## 2021-09-25 DIAGNOSIS — D631 Anemia in chronic kidney disease: Secondary | ICD-10-CM | POA: Diagnosis not present

## 2021-09-25 DIAGNOSIS — N1832 Chronic kidney disease, stage 3b: Secondary | ICD-10-CM | POA: Diagnosis not present

## 2021-10-16 DIAGNOSIS — M810 Age-related osteoporosis without current pathological fracture: Secondary | ICD-10-CM | POA: Diagnosis not present

## 2021-10-16 DIAGNOSIS — I10 Essential (primary) hypertension: Secondary | ICD-10-CM | POA: Diagnosis not present

## 2021-10-16 DIAGNOSIS — E785 Hyperlipidemia, unspecified: Secondary | ICD-10-CM | POA: Diagnosis not present

## 2021-10-16 DIAGNOSIS — N1832 Chronic kidney disease, stage 3b: Secondary | ICD-10-CM | POA: Diagnosis not present

## 2021-10-19 DIAGNOSIS — I739 Peripheral vascular disease, unspecified: Secondary | ICD-10-CM | POA: Diagnosis not present

## 2021-10-19 DIAGNOSIS — I4819 Other persistent atrial fibrillation: Secondary | ICD-10-CM | POA: Diagnosis not present

## 2021-10-19 DIAGNOSIS — Z87891 Personal history of nicotine dependence: Secondary | ICD-10-CM | POA: Diagnosis not present

## 2021-10-19 DIAGNOSIS — Z7901 Long term (current) use of anticoagulants: Secondary | ICD-10-CM | POA: Diagnosis not present

## 2021-10-19 DIAGNOSIS — D692 Other nonthrombocytopenic purpura: Secondary | ICD-10-CM | POA: Diagnosis not present

## 2021-10-19 DIAGNOSIS — I480 Paroxysmal atrial fibrillation: Secondary | ICD-10-CM | POA: Diagnosis not present

## 2021-10-19 DIAGNOSIS — L97911 Non-pressure chronic ulcer of unspecified part of right lower leg limited to breakdown of skin: Secondary | ICD-10-CM | POA: Diagnosis not present

## 2021-10-19 DIAGNOSIS — D6869 Other thrombophilia: Secondary | ICD-10-CM | POA: Diagnosis not present

## 2021-10-19 DIAGNOSIS — S80811D Abrasion, right lower leg, subsequent encounter: Secondary | ICD-10-CM | POA: Diagnosis not present

## 2021-10-19 DIAGNOSIS — I10 Essential (primary) hypertension: Secondary | ICD-10-CM | POA: Diagnosis not present

## 2021-11-11 DIAGNOSIS — J019 Acute sinusitis, unspecified: Secondary | ICD-10-CM | POA: Diagnosis not present

## 2021-11-11 DIAGNOSIS — J209 Acute bronchitis, unspecified: Secondary | ICD-10-CM | POA: Diagnosis not present

## 2021-11-14 IMAGING — CT CT CERVICAL SPINE W/O CM
3 series · 15 of 33 positions shown, 18 images · non-contrast
Comparison: 07/19/2020

CLINICAL DATA: Fall, anticoagulation

EXAM:
CT HEAD WITHOUT CONTRAST
CT CERVICAL SPINE WITHOUT CONTRAST
TECHNIQUE: Multidetector CT imaging of the head and cervical spine was
performed following the standard protocol without intravenous
contrast. Multiplanar CT image reconstructions of the cervical spine
were also generated.

[Series 5: c spine soft · axial · 0.32mm/px · z∈[+1143,+1287]mm · 7 of 85 slices shown, 9 images]
[im 7/85  soft-tissue]
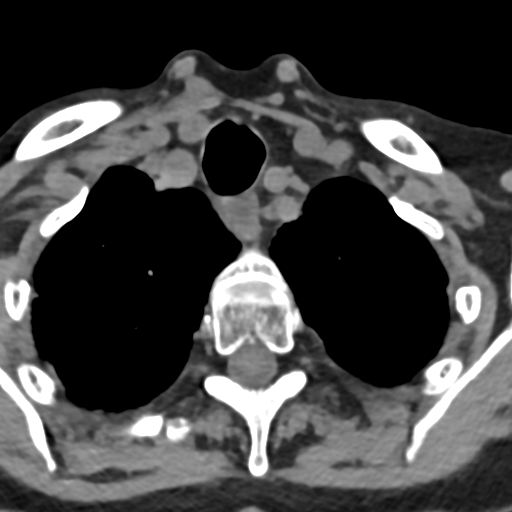
[im 7/85  bone]
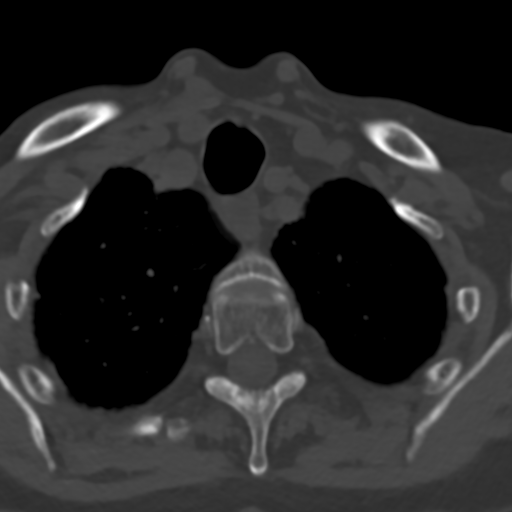
[im 20/85  bone]
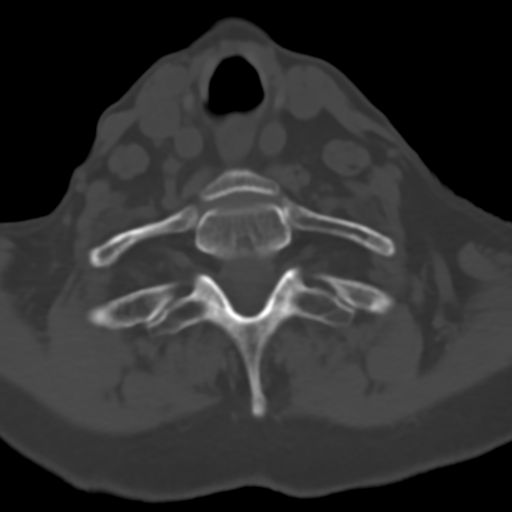
[im 33/85  bone]
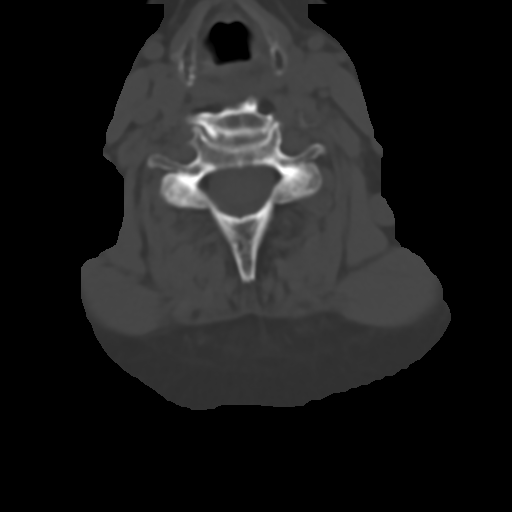
[im 46/85  bone]
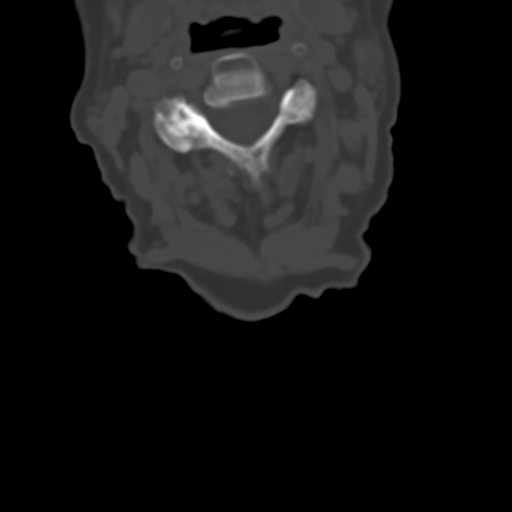
[im 52/85  soft-tissue]
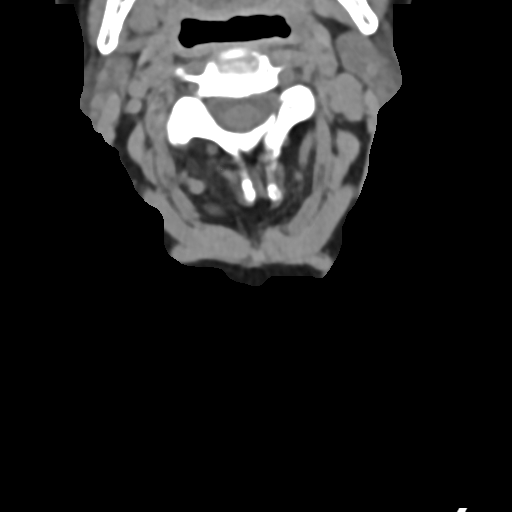
[im 52/85  bone]
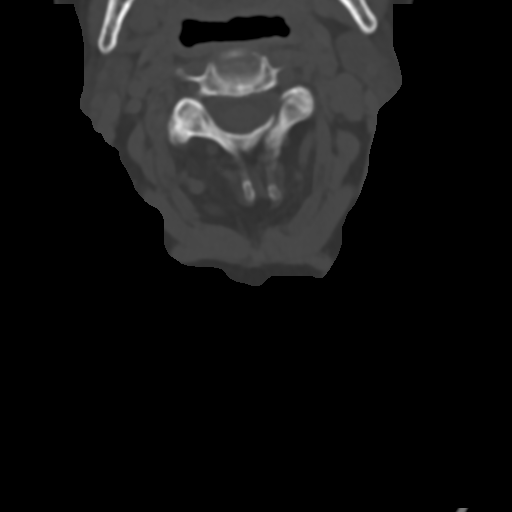
[im 65/85  bone]
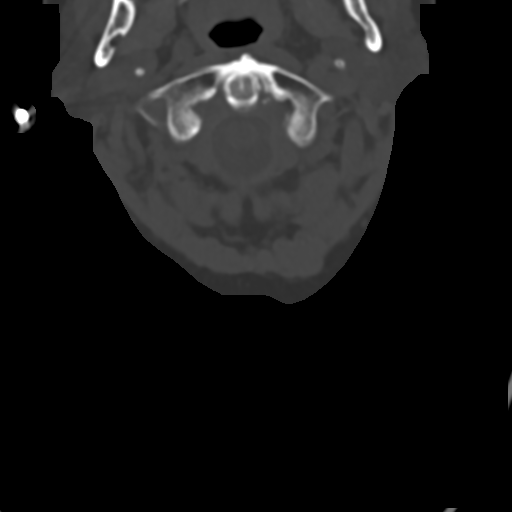
[im 78/85  bone]
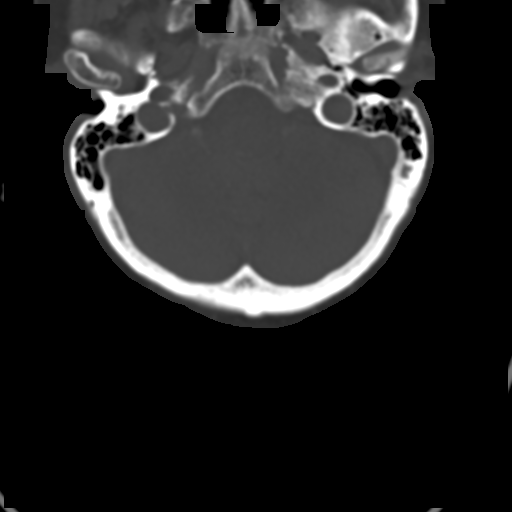

[Series 8: sag bone · sagittal · 0.34mm/px · 5 of 67 slices shown, 6 images]
[im 23/67  bone]
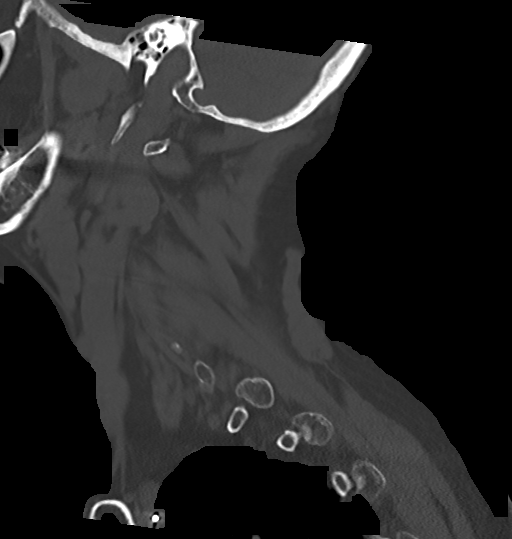
[im 28/67  bone]
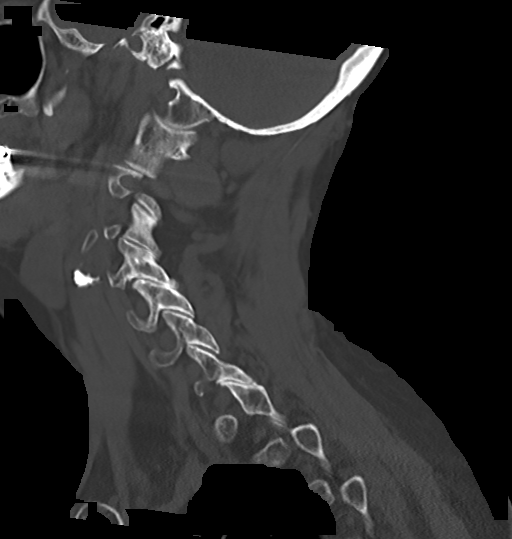
[im 34/67  soft-tissue]
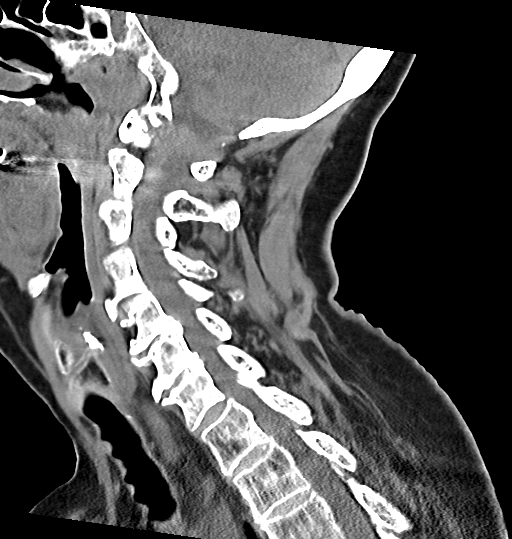
[im 34/67  bone]
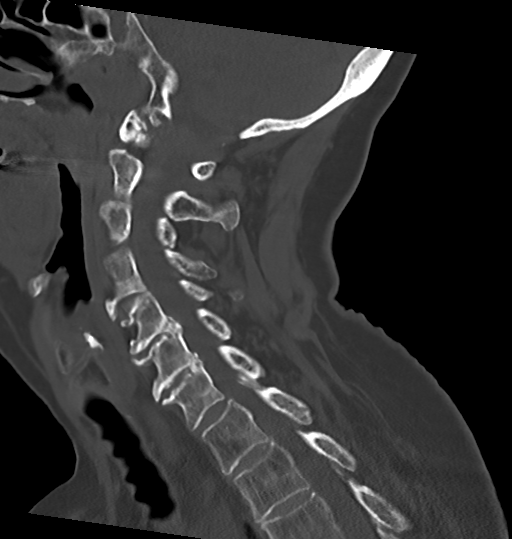
[im 39/67  bone]
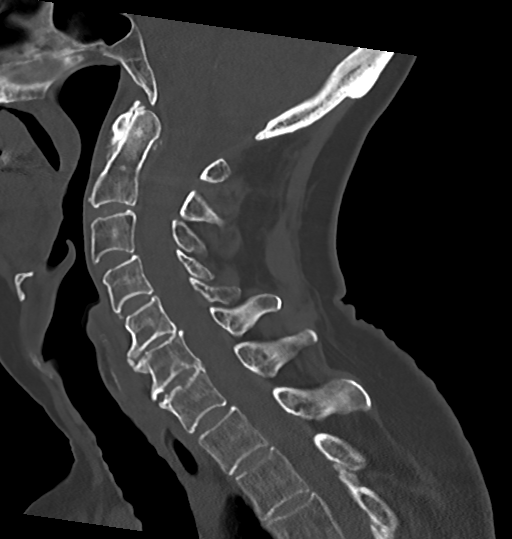
[im 45/67  bone]
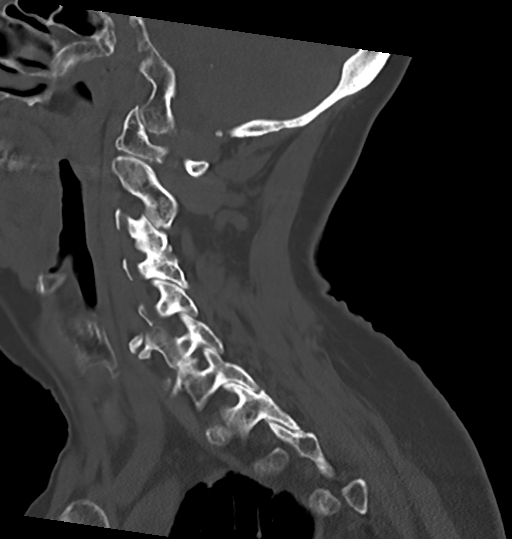

[Series 9: cor bone · coronal · 0.30mm/px · 3 of 86 slices shown]
[im 18/86  bone]
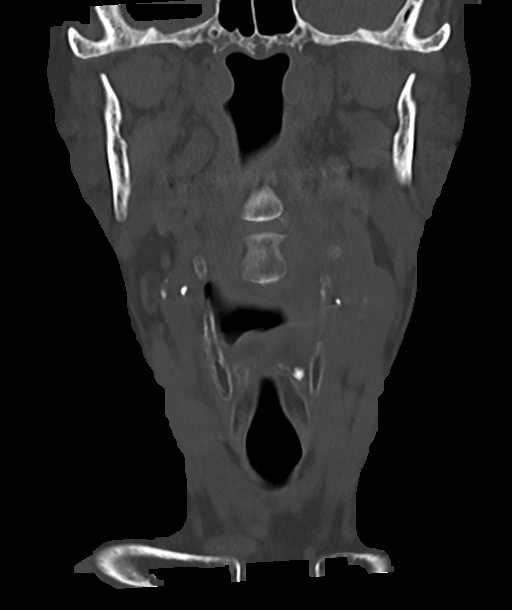
[im 35/86  bone]
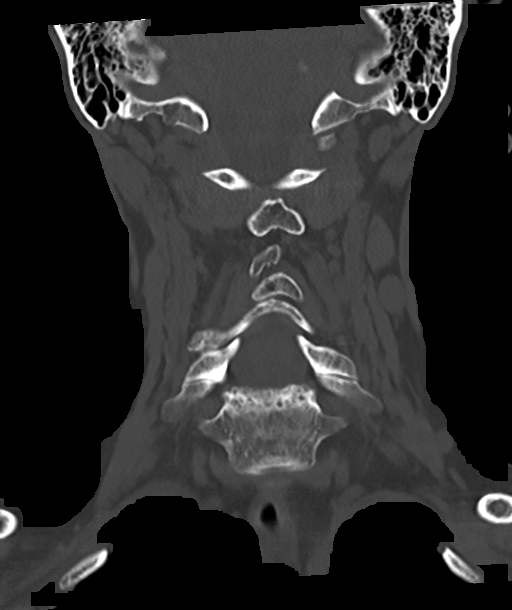
[im 52/86  bone]
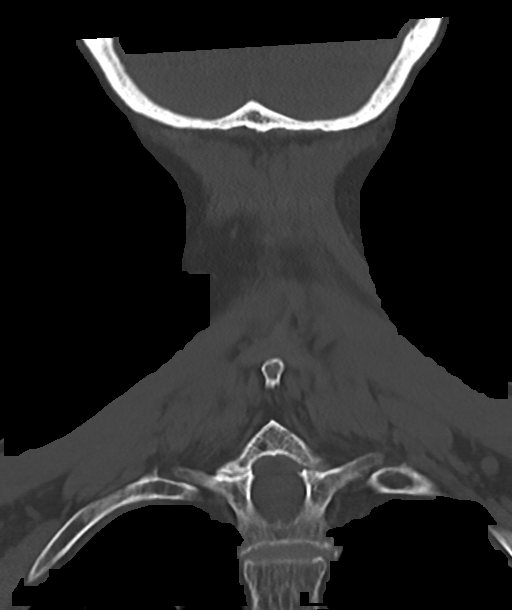

[15 of 33 positions shown; findings below may reference images not displayed]

FINDINGS: CT HEAD FINDINGS

Brain: No evidence of acute infarction, hemorrhage, hydrocephalus,
extra-axial collection or mass lesion/mass effect. Periventricular
and deep white matter hypodensity.

Vascular: No hyperdense vessel or unexpected calcification.

Skull: Normal. Negative for fracture or focal lesion.

Sinuses/Orbits: No acute finding.

Other: None.

CT CERVICAL SPINE FINDINGS

Alignment: Normal.

Skull base and vertebrae: No acute fracture. No primary bone lesion
or focal pathologic process.

Soft tissues and spinal canal: No prevertebral fluid or swelling. No
visible canal hematoma.

Disc levels: Mild multilevel disc space height loss and
osteophytosis.

Upper chest: Negative.

Other: None.
IMPRESSION: 1. No acute intracranial pathology. Small-vessel white matter
disease.
2. No fracture or static subluxation of the cervical spine.
3. Mild multilevel disc space height loss and osteophytosis.

## 2021-11-15 ENCOUNTER — Ambulatory Visit (INDEPENDENT_AMBULATORY_CARE_PROVIDER_SITE_OTHER): Payer: PPO

## 2021-11-15 DIAGNOSIS — I442 Atrioventricular block, complete: Secondary | ICD-10-CM

## 2021-11-15 LAB — CUP PACEART REMOTE DEVICE CHECK
Battery Remaining Longevity: 140 mo
Battery Voltage: 3.05 V
Brady Statistic AP VP Percent: 0 %
Brady Statistic AP VS Percent: 0 %
Brady Statistic AS VP Percent: 99.53 %
Brady Statistic AS VS Percent: 0.47 %
Brady Statistic RA Percent Paced: 0 %
Brady Statistic RV Percent Paced: 99.53 %
Date Time Interrogation Session: 20221129235025
Implantable Lead Implant Date: 20210713
Implantable Lead Location: 753860
Implantable Lead Model: 5076
Implantable Pulse Generator Implant Date: 20210713
Lead Channel Impedance Value: 3439 Ohm
Lead Channel Impedance Value: 3439 Ohm
Lead Channel Impedance Value: 380 Ohm
Lead Channel Impedance Value: 456 Ohm
Lead Channel Pacing Threshold Amplitude: 0.75 V
Lead Channel Pacing Threshold Pulse Width: 0.4 ms
Lead Channel Sensing Intrinsic Amplitude: 14.125 mV
Lead Channel Sensing Intrinsic Amplitude: 14.125 mV
Lead Channel Setting Pacing Amplitude: 2 V
Lead Channel Setting Pacing Pulse Width: 0.4 ms
Lead Channel Setting Sensing Sensitivity: 0.9 mV

## 2021-11-21 DIAGNOSIS — R059 Cough, unspecified: Secondary | ICD-10-CM | POA: Diagnosis not present

## 2021-11-21 DIAGNOSIS — Z8709 Personal history of other diseases of the respiratory system: Secondary | ICD-10-CM | POA: Diagnosis not present

## 2021-11-24 NOTE — Progress Notes (Signed)
Remote pacemaker transmission.   

## 2021-11-30 ENCOUNTER — Other Ambulatory Visit: Payer: Self-pay | Admitting: Internal Medicine

## 2021-12-06 ENCOUNTER — Telehealth: Payer: Self-pay | Admitting: Internal Medicine

## 2021-12-06 NOTE — Telephone Encounter (Signed)
Returned call to Pt.  Pt c/o back spasms between her shoulder blades that "take my breath away".  States she had one in the middle of the night that work her from sleep.  These spasms are random.  Started about a week ago.  She does not remember doing anything to aggravate the area.  Discussed with Dr. Lovena Le.  Advised pain does not sound cardiac related.  Advised if pain gets worse and happens when she is walking/doing chores she should call back.  Pt indicates understanding.

## 2021-12-06 NOTE — Telephone Encounter (Signed)
Patient called stating that she is having muscle spasms in between her shoulder blades.  Every once in awhile it happens and she can't catch her breathe.

## 2021-12-07 ENCOUNTER — Other Ambulatory Visit (HOSPITAL_COMMUNITY): Payer: Self-pay | Admitting: Internal Medicine

## 2021-12-07 NOTE — Telephone Encounter (Signed)
Prescription refill request for Eliquis received. Indication: Afib  Last office visit: 01/23/21 Lovena Le)  Scr: 1.78 (02/03/21)  Age: 87 Weight: 54.1kg  Appropriate dose and refill sent to requested pharmacy.

## 2022-01-02 ENCOUNTER — Other Ambulatory Visit: Payer: Self-pay | Admitting: Internal Medicine

## 2022-01-09 DIAGNOSIS — Z85828 Personal history of other malignant neoplasm of skin: Secondary | ICD-10-CM | POA: Diagnosis not present

## 2022-01-09 DIAGNOSIS — Z08 Encounter for follow-up examination after completed treatment for malignant neoplasm: Secondary | ICD-10-CM | POA: Diagnosis not present

## 2022-01-09 DIAGNOSIS — L578 Other skin changes due to chronic exposure to nonionizing radiation: Secondary | ICD-10-CM | POA: Diagnosis not present

## 2022-01-23 ENCOUNTER — Other Ambulatory Visit: Payer: Self-pay | Admitting: Internal Medicine

## 2022-01-23 DIAGNOSIS — Z6821 Body mass index (BMI) 21.0-21.9, adult: Secondary | ICD-10-CM | POA: Diagnosis not present

## 2022-01-23 DIAGNOSIS — I5030 Unspecified diastolic (congestive) heart failure: Secondary | ICD-10-CM | POA: Diagnosis not present

## 2022-01-23 DIAGNOSIS — N1832 Chronic kidney disease, stage 3b: Secondary | ICD-10-CM | POA: Diagnosis not present

## 2022-01-23 DIAGNOSIS — I4819 Other persistent atrial fibrillation: Secondary | ICD-10-CM | POA: Diagnosis not present

## 2022-01-23 DIAGNOSIS — N2581 Secondary hyperparathyroidism of renal origin: Secondary | ICD-10-CM | POA: Diagnosis not present

## 2022-01-23 DIAGNOSIS — I739 Peripheral vascular disease, unspecified: Secondary | ICD-10-CM | POA: Diagnosis not present

## 2022-01-23 DIAGNOSIS — I130 Hypertensive heart and chronic kidney disease with heart failure and stage 1 through stage 4 chronic kidney disease, or unspecified chronic kidney disease: Secondary | ICD-10-CM | POA: Diagnosis not present

## 2022-01-23 DIAGNOSIS — Z7901 Long term (current) use of anticoagulants: Secondary | ICD-10-CM | POA: Diagnosis not present

## 2022-01-23 DIAGNOSIS — D6869 Other thrombophilia: Secondary | ICD-10-CM | POA: Diagnosis not present

## 2022-01-31 ENCOUNTER — Other Ambulatory Visit: Payer: Self-pay | Admitting: Internal Medicine

## 2022-02-14 ENCOUNTER — Ambulatory Visit (INDEPENDENT_AMBULATORY_CARE_PROVIDER_SITE_OTHER): Payer: PPO

## 2022-02-14 DIAGNOSIS — I442 Atrioventricular block, complete: Secondary | ICD-10-CM

## 2022-02-16 LAB — CUP PACEART REMOTE DEVICE CHECK
Battery Remaining Longevity: 138 mo
Battery Voltage: 3.04 V
Brady Statistic AP VP Percent: 0 %
Brady Statistic AP VS Percent: 0 %
Brady Statistic AS VP Percent: 99.03 %
Brady Statistic AS VS Percent: 0.97 %
Brady Statistic RA Percent Paced: 0 %
Brady Statistic RV Percent Paced: 99.03 %
Date Time Interrogation Session: 20230302103024
Implantable Lead Implant Date: 20210713
Implantable Lead Location: 753860
Implantable Lead Model: 5076
Implantable Pulse Generator Implant Date: 20210713
Lead Channel Impedance Value: 3439 Ohm
Lead Channel Impedance Value: 3439 Ohm
Lead Channel Impedance Value: 380 Ohm
Lead Channel Impedance Value: 475 Ohm
Lead Channel Pacing Threshold Amplitude: 0.75 V
Lead Channel Pacing Threshold Pulse Width: 0.4 ms
Lead Channel Sensing Intrinsic Amplitude: 14.125 mV
Lead Channel Sensing Intrinsic Amplitude: 14.125 mV
Lead Channel Setting Pacing Amplitude: 2 V
Lead Channel Setting Pacing Pulse Width: 0.4 ms
Lead Channel Setting Sensing Sensitivity: 0.9 mV

## 2022-02-21 NOTE — Progress Notes (Signed)
Remote pacemaker transmission.   

## 2022-02-22 ENCOUNTER — Other Ambulatory Visit: Payer: Self-pay | Admitting: Internal Medicine

## 2022-03-01 DIAGNOSIS — I10 Essential (primary) hypertension: Secondary | ICD-10-CM | POA: Diagnosis not present

## 2022-03-01 DIAGNOSIS — E785 Hyperlipidemia, unspecified: Secondary | ICD-10-CM | POA: Diagnosis not present

## 2022-03-01 DIAGNOSIS — M810 Age-related osteoporosis without current pathological fracture: Secondary | ICD-10-CM | POA: Diagnosis not present

## 2022-03-01 DIAGNOSIS — E039 Hypothyroidism, unspecified: Secondary | ICD-10-CM | POA: Diagnosis not present

## 2022-03-06 DIAGNOSIS — R82998 Other abnormal findings in urine: Secondary | ICD-10-CM | POA: Diagnosis not present

## 2022-03-06 DIAGNOSIS — I701 Atherosclerosis of renal artery: Secondary | ICD-10-CM | POA: Diagnosis not present

## 2022-03-06 DIAGNOSIS — I442 Atrioventricular block, complete: Secondary | ICD-10-CM | POA: Diagnosis not present

## 2022-03-06 DIAGNOSIS — I4891 Unspecified atrial fibrillation: Secondary | ICD-10-CM | POA: Diagnosis not present

## 2022-03-06 DIAGNOSIS — K922 Gastrointestinal hemorrhage, unspecified: Secondary | ICD-10-CM | POA: Diagnosis not present

## 2022-03-06 DIAGNOSIS — I129 Hypertensive chronic kidney disease with stage 1 through stage 4 chronic kidney disease, or unspecified chronic kidney disease: Secondary | ICD-10-CM | POA: Diagnosis not present

## 2022-03-06 DIAGNOSIS — E039 Hypothyroidism, unspecified: Secondary | ICD-10-CM | POA: Diagnosis not present

## 2022-03-06 DIAGNOSIS — H903 Sensorineural hearing loss, bilateral: Secondary | ICD-10-CM | POA: Diagnosis not present

## 2022-03-06 DIAGNOSIS — Z0000 Encounter for general adult medical examination without abnormal findings: Secondary | ICD-10-CM | POA: Diagnosis not present

## 2022-03-06 DIAGNOSIS — M810 Age-related osteoporosis without current pathological fracture: Secondary | ICD-10-CM | POA: Diagnosis not present

## 2022-03-06 DIAGNOSIS — N1832 Chronic kidney disease, stage 3b: Secondary | ICD-10-CM | POA: Diagnosis not present

## 2022-03-10 ENCOUNTER — Other Ambulatory Visit: Payer: Self-pay | Admitting: Internal Medicine

## 2022-03-20 DIAGNOSIS — H04123 Dry eye syndrome of bilateral lacrimal glands: Secondary | ICD-10-CM | POA: Diagnosis not present

## 2022-03-20 DIAGNOSIS — H02834 Dermatochalasis of left upper eyelid: Secondary | ICD-10-CM | POA: Diagnosis not present

## 2022-03-20 DIAGNOSIS — H02831 Dermatochalasis of right upper eyelid: Secondary | ICD-10-CM | POA: Diagnosis not present

## 2022-03-20 DIAGNOSIS — H2513 Age-related nuclear cataract, bilateral: Secondary | ICD-10-CM | POA: Diagnosis not present

## 2022-03-20 DIAGNOSIS — H40053 Ocular hypertension, bilateral: Secondary | ICD-10-CM | POA: Diagnosis not present

## 2022-03-24 ENCOUNTER — Other Ambulatory Visit: Payer: Self-pay | Admitting: Internal Medicine

## 2022-03-26 ENCOUNTER — Ambulatory Visit: Payer: PPO | Admitting: Internal Medicine

## 2022-03-26 ENCOUNTER — Encounter: Payer: Self-pay | Admitting: Internal Medicine

## 2022-03-26 VITALS — BP 132/70 | HR 65 | Ht 62.0 in | Wt 116.0 lb

## 2022-03-26 DIAGNOSIS — I4819 Other persistent atrial fibrillation: Secondary | ICD-10-CM | POA: Diagnosis not present

## 2022-03-26 DIAGNOSIS — I442 Atrioventricular block, complete: Secondary | ICD-10-CM | POA: Diagnosis not present

## 2022-03-26 DIAGNOSIS — I10 Essential (primary) hypertension: Secondary | ICD-10-CM | POA: Diagnosis not present

## 2022-03-26 DIAGNOSIS — Z950 Presence of cardiac pacemaker: Secondary | ICD-10-CM | POA: Diagnosis not present

## 2022-03-26 NOTE — Progress Notes (Signed)
? ? ? ? ?HPI ?Brandi Chambers returns today for followup. She is a pleasant 88 yo woman with a h/o CHB, s/p PPM Insertion with early RV lead failure and was then found to have an occluded left SCV. She had extraction of her atrial lead and insertion of a new RV lead but due to scar tissue could not get another atrial lead in place. 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 In the interim, her diuretic was reduced to 40 qam and 20 qpm and she feels better. She has reverted to atrial fib. She is taking her beta blocker but stopped taking the amiodarone. She denies chest pain but does have sob with exertion. ?Allergies  ?Allergen Reactions  ? Alendronate Sodium Other (See Comments)  ?  GI UPSET  ? ? ? ?Current Outpatient Medications  ?Medication Sig Dispense Refill  ? amLODipine (NORVASC) 2.5 MG tablet Take 2.5 mg by mouth daily.   3  ? ELIQUIS 2.5 MG TABS tablet TAKE 1 TABLET BY MOUTH TWICE A DAY 60 tablet 5  ? furosemide (LASIX) 40 MG tablet TAKE ONE TABLET DAILY-NEED APPOINTMENT 30 tablet 2  ? levothyroxine (SYNTHROID, LEVOTHROID) 75 MCG tablet Take 75 mcg by mouth daily before breakfast.    ? metoprolol succinate (TOPROL-XL) 25 MG 24 hr tablet Take 1 tablet (25 mg total) by mouth daily. 30 tablet 1  ? nitroGLYCERIN (NITROSTAT) 0.4 MG SL tablet Place 0.4 mg under the tongue every 5 (five) minutes x 3 doses as needed for chest pain.    ? ?No current facility-administered medications for this visit.  ? ? ? ?Past Medical History:  ?Diagnosis Date  ? Arthritis   ? RA IN HANDS  ? Chronic congestive heart failure with left ventricular diastolic dysfunction ([facility])   ? CKD (chronic kidney disease)   ? Complete heart block ([place])   ? Hypertension   ? Hypoglycemia   ? Hypothyroidism   ? Nephrolithiasis   ? Osteoporosis   ? Persistent atrial fibrillation ([place])   ? Presence of permanent cardiac pacemaker   ? Thyroid disease   ? Typical atrial flutter ([place])   ? ? ?ROS: ? ? All  systems reviewed and negative except as noted in the HPI. ? ? ?Past Surgical History:  ?Procedure Laterality Date  ? APPENDECTOMY    ? CARDIOVERSION N/A 05/14/2017  ? Procedure: CARDIOVERSION;  Surgeon: [name], MD;  Location: [place];  Service: Cardiovascular;  Laterality: N/A;  ? CARDIOVERSION N/A 07/23/2018  ? Procedure: CARDIOVERSION;  Surgeon: [name], MD;  Location[facility] ENDOSCOPY;  Service: Cardiovascular;  Laterality: N/A;  ? INSERT / REPLACE / REMOVE PACEMAKER    ? LEAD REVISION/REPAIR N/A 06/09/2020  ? Procedure: LEAD REVISION/REPAIR;  Surgeon: [name], MD;  Location: [place] [facility];  Service: Cardiovascular;  Laterality: N/A;  ? PACEMAKER IMPLANT N/A 06/06/2017  ? Procedure: Pacemaker Implant;  Surgeon: [name], MD;  Location: [place] CV LAB;  Service: Cardiovascular;  Laterality: N/A;  ? PACEMAKER REVISION N/A 06/28/2020  ? Procedure: PACEMAKER SYSTEM REVISION;  Surgeon: Evans Lance, MD;  Location: [place] CV LAB;  Service: Cardiovascular;  Laterality: N/A;  BARTLE BACK UP  ? PPM GENERATOR REMOVAL N/A 06/09/2020  ? Procedure: PPM GENERATOR REMOVAL;  Surgeon: [name], MD;  Location: [place] CV LAB;  Service: Cardiovascular;  Laterality: N/A;  ? ? ? ?Family History  ?Problem Relation Age of Onset  ?  Breast cancer Mother   ? Lung cancer Father   ? ? ? ?Social History  ? ?Socioeconomic History  ? Marital status: Married  ?  Spouse name: Not on file  ? Number of children: 1  ? Years of education: Not on file  ? Highest education level: Not on file  ?Occupational History  ? Not on file  ?Tobacco Use  ? Smoking status: Former  ?  Types: Cigarettes  ?  Quit date: 05/18/1969  ?  Years since quitting: 52.8  ? Smokeless tobacco: Never  ?Vaping Use  ? Vaping Use: Never used  ?Substance and Sexual Activity  ? Alcohol use: No  ? Drug use: No  ? Sexual activity: Not on file  ?Other Topics Concern  ? Not on file  ?Social History Narrative  ? Lives in [place] with her  spouse.  Retired  ? ?Social Determinants of Health  ? ?Financial Resource Strain: Not on file  ?Food Insecurity: Not on file  ?Transportation Needs: Not on file  ?Physical Activity: Not on file  ?Stress: Not on file  ?Social Connections: Not on file  ?Intimate Partner Violence: Not on file  ? ? ? ?BP 132/70   Pulse 65   Ht '5\' 2"'$  (1.575 m)   Wt 116 lb (52.6 kg)   LMP  (LMP Unknown)   BMI 21.22 kg/m?  ? ?Physical Exam: ? ?Frail but well appearing NAD ?HEENT: Unremarkable ?Neck:  No JVD, no thyromegally ?Lymphatics:  No adenopathy ?Back:  No CVA tenderness ?Lungs:  Clear with no wheezes ?HEART:  Regular rate rhythm, no murmurs, no rubs, no clicks ?Abd:  soft, positive bowel sounds, no organomegally, no rebound, no guarding ?Ext:  2 plus pulses, no edema, no cyanosis, no clubbing ?Skin:  No rashes no nodules ?Neuro:  CN II through XII intact, motor grossly intact ? ?EKG - atrial fib with ventricular pacing ? ?DEVICE  ?Normal device function.  See PaceArt for details.  ? ?Assess/Plan:  ?1. CHB - she is stable after PPM revision. ?2. PPM - she has a single lead in the ventricle. Her single medtronic PPM is working normally ?3. 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  ?4. HTN -her sbp is better. She will maintain a low sodium diet. ?  ?Carleene Overlie Nahome Bublitz,MD ?

## 2022-03-26 NOTE — Patient Instructions (Signed)
Medication Instructions:  ?Your physician recommends that you continue on your current medications as directed. Please refer to the Current Medication list given to you today. ? ?Labwork: ?None ordered. ? ?Testing/Procedures: ?None ordered. ? ?Follow-Up: ?Your physician wants you to follow-up in: one year with Cristopher Peru, MD or one of the following Advanced Practice Providers on your designated Care Team:   ?Tommye Standard, PA-C ?Legrand Como "Jonni Sanger" Hewitt, PA-C ? ?Remote monitoring is used to monitor your Pacemaker from home. This monitoring reduces the number of office visits required to check your device to one time per year. It allows [place] to keep an eye on the functioning of your device to ensure it is working properly. You are scheduled for a device check from home on 05/16/2022. You may send your transmission at any time that day. If you have a wireless device, the transmission will be sent automatically. After your physician reviews your transmission, you will receive a postcard with your next transmission date. ? ?Any Other Special Instructions Will Be Listed Below (If Applicable). ? ?If you need a refill on your cardiac medications before your next appointment, please call your pharmacy.  ? ?Important Information About Sugar ? ? ? ? ? ? ? ?

## 2022-04-06 DIAGNOSIS — N2581 Secondary hyperparathyroidism of renal origin: Secondary | ICD-10-CM | POA: Diagnosis not present

## 2022-04-06 DIAGNOSIS — N1832 Chronic kidney disease, stage 3b: Secondary | ICD-10-CM | POA: Diagnosis not present

## 2022-04-06 DIAGNOSIS — N189 Chronic kidney disease, unspecified: Secondary | ICD-10-CM | POA: Diagnosis not present

## 2022-04-06 DIAGNOSIS — I4891 Unspecified atrial fibrillation: Secondary | ICD-10-CM | POA: Diagnosis not present

## 2022-04-06 DIAGNOSIS — I129 Hypertensive chronic kidney disease with stage 1 through stage 4 chronic kidney disease, or unspecified chronic kidney disease: Secondary | ICD-10-CM | POA: Diagnosis not present

## 2022-04-06 DIAGNOSIS — D631 Anemia in chronic kidney disease: Secondary | ICD-10-CM | POA: Diagnosis not present

## 2022-04-06 DIAGNOSIS — Z950 Presence of cardiac pacemaker: Secondary | ICD-10-CM | POA: Diagnosis not present

## 2022-04-12 DIAGNOSIS — Z08 Encounter for follow-up examination after completed treatment for malignant neoplasm: Secondary | ICD-10-CM | POA: Diagnosis not present

## 2022-04-12 DIAGNOSIS — L570 Actinic keratosis: Secondary | ICD-10-CM | POA: Diagnosis not present

## 2022-04-12 DIAGNOSIS — Z85828 Personal history of other malignant neoplasm of skin: Secondary | ICD-10-CM | POA: Diagnosis not present

## 2022-04-15 DIAGNOSIS — E039 Hypothyroidism, unspecified: Secondary | ICD-10-CM | POA: Diagnosis not present

## 2022-04-15 DIAGNOSIS — I4891 Unspecified atrial fibrillation: Secondary | ICD-10-CM | POA: Diagnosis not present

## 2022-04-15 DIAGNOSIS — I130 Hypertensive heart and chronic kidney disease with heart failure and stage 1 through stage 4 chronic kidney disease, or unspecified chronic kidney disease: Secondary | ICD-10-CM | POA: Diagnosis not present

## 2022-05-01 ENCOUNTER — Other Ambulatory Visit: Payer: Self-pay | Admitting: Internal Medicine

## 2022-05-16 ENCOUNTER — Ambulatory Visit (INDEPENDENT_AMBULATORY_CARE_PROVIDER_SITE_OTHER): Payer: PPO

## 2022-05-16 DIAGNOSIS — I442 Atrioventricular block, complete: Secondary | ICD-10-CM | POA: Diagnosis not present

## 2022-05-17 DIAGNOSIS — I739 Peripheral vascular disease, unspecified: Secondary | ICD-10-CM | POA: Diagnosis not present

## 2022-05-17 DIAGNOSIS — D692 Other nonthrombocytopenic purpura: Secondary | ICD-10-CM | POA: Diagnosis not present

## 2022-05-17 DIAGNOSIS — D6869 Other thrombophilia: Secondary | ICD-10-CM | POA: Diagnosis not present

## 2022-05-17 DIAGNOSIS — I4819 Other persistent atrial fibrillation: Secondary | ICD-10-CM | POA: Diagnosis not present

## 2022-05-17 DIAGNOSIS — I110 Hypertensive heart disease with heart failure: Secondary | ICD-10-CM | POA: Diagnosis not present

## 2022-05-17 DIAGNOSIS — Z7901 Long term (current) use of anticoagulants: Secondary | ICD-10-CM | POA: Diagnosis not present

## 2022-05-17 DIAGNOSIS — I5030 Unspecified diastolic (congestive) heart failure: Secondary | ICD-10-CM | POA: Diagnosis not present

## 2022-05-17 DIAGNOSIS — Z6821 Body mass index (BMI) 21.0-21.9, adult: Secondary | ICD-10-CM | POA: Diagnosis not present

## 2022-05-17 LAB — CUP PACEART REMOTE DEVICE CHECK
Battery Remaining Longevity: 132 mo
Battery Voltage: 3.03 V
Brady Statistic AP VP Percent: 0 %
Brady Statistic AP VS Percent: 0 %
Brady Statistic AS VP Percent: 99.98 %
Brady Statistic AS VS Percent: 0.02 %
Brady Statistic RA Percent Paced: 0 %
Brady Statistic RV Percent Paced: 99.98 %
Date Time Interrogation Session: 20230531073233
Implantable Lead Implant Date: 20210713
Implantable Lead Location: 753860
Implantable Lead Model: 5076
Implantable Pulse Generator Implant Date: 20210713
Lead Channel Impedance Value: 3439 Ohm
Lead Channel Impedance Value: 3439 Ohm
Lead Channel Impedance Value: 361 Ohm
Lead Channel Impedance Value: 418 Ohm
Lead Channel Pacing Threshold Amplitude: 0.75 V
Lead Channel Pacing Threshold Pulse Width: 0.4 ms
Lead Channel Sensing Intrinsic Amplitude: 14.125 mV
Lead Channel Sensing Intrinsic Amplitude: 14.125 mV
Lead Channel Setting Pacing Amplitude: 2 V
Lead Channel Setting Pacing Pulse Width: 0.4 ms
Lead Channel Setting Sensing Sensitivity: 0.9 mV

## 2022-05-30 NOTE — Progress Notes (Signed)
Remote pacemaker transmission.   

## 2022-06-05 DIAGNOSIS — Z23 Encounter for immunization: Secondary | ICD-10-CM | POA: Diagnosis not present

## 2022-06-09 ENCOUNTER — Other Ambulatory Visit (HOSPITAL_COMMUNITY): Payer: Self-pay | Admitting: Internal Medicine

## 2022-08-02 ENCOUNTER — Other Ambulatory Visit: Payer: Self-pay | Admitting: Internal Medicine

## 2022-08-15 ENCOUNTER — Ambulatory Visit (INDEPENDENT_AMBULATORY_CARE_PROVIDER_SITE_OTHER): Payer: PPO

## 2022-08-15 DIAGNOSIS — I442 Atrioventricular block, complete: Secondary | ICD-10-CM

## 2022-08-19 LAB — CUP PACEART REMOTE DEVICE CHECK
Battery Remaining Longevity: 130 mo
Battery Voltage: 3.03 V
Brady Statistic AP VP Percent: 0 %
Brady Statistic AP VS Percent: 0 %
Brady Statistic AS VP Percent: 99.99 %
Brady Statistic AS VS Percent: 0.02 %
Brady Statistic RA Percent Paced: 0 %
Brady Statistic RV Percent Paced: 99.99 %
Date Time Interrogation Session: 20230830004917
Implantable Lead Implant Date: 20210713
Implantable Lead Location: 753860
Implantable Lead Model: 5076
Implantable Pulse Generator Implant Date: 20210713
Lead Channel Impedance Value: 3439 Ohm
Lead Channel Impedance Value: 3439 Ohm
Lead Channel Impedance Value: 361 Ohm
Lead Channel Impedance Value: 437 Ohm
Lead Channel Pacing Threshold Amplitude: 0.75 V
Lead Channel Pacing Threshold Pulse Width: 0.4 ms
Lead Channel Sensing Intrinsic Amplitude: 14.125 mV
Lead Channel Sensing Intrinsic Amplitude: 14.125 mV
Lead Channel Setting Pacing Amplitude: 2 V
Lead Channel Setting Pacing Pulse Width: 0.4 ms
Lead Channel Setting Sensing Sensitivity: 0.9 mV

## 2022-09-07 NOTE — Progress Notes (Signed)
Remote pacemaker transmission.   

## 2022-10-17 DIAGNOSIS — B029 Zoster without complications: Secondary | ICD-10-CM | POA: Diagnosis not present

## 2022-10-31 DIAGNOSIS — D631 Anemia in chronic kidney disease: Secondary | ICD-10-CM | POA: Diagnosis not present

## 2022-10-31 DIAGNOSIS — N189 Chronic kidney disease, unspecified: Secondary | ICD-10-CM | POA: Diagnosis not present

## 2022-10-31 DIAGNOSIS — N1832 Chronic kidney disease, stage 3b: Secondary | ICD-10-CM | POA: Diagnosis not present

## 2022-10-31 DIAGNOSIS — N2581 Secondary hyperparathyroidism of renal origin: Secondary | ICD-10-CM | POA: Diagnosis not present

## 2022-10-31 DIAGNOSIS — I129 Hypertensive chronic kidney disease with stage 1 through stage 4 chronic kidney disease, or unspecified chronic kidney disease: Secondary | ICD-10-CM | POA: Diagnosis not present

## 2022-10-31 DIAGNOSIS — Z950 Presence of cardiac pacemaker: Secondary | ICD-10-CM | POA: Diagnosis not present

## 2022-10-31 DIAGNOSIS — I4891 Unspecified atrial fibrillation: Secondary | ICD-10-CM | POA: Diagnosis not present

## 2022-11-14 ENCOUNTER — Ambulatory Visit (INDEPENDENT_AMBULATORY_CARE_PROVIDER_SITE_OTHER): Payer: PPO

## 2022-11-14 DIAGNOSIS — I442 Atrioventricular block, complete: Secondary | ICD-10-CM | POA: Diagnosis not present

## 2022-11-14 LAB — CUP PACEART REMOTE DEVICE CHECK
Battery Remaining Longevity: 127 mo
Battery Voltage: 3.03 V
Brady Statistic AP VP Percent: 0 %
Brady Statistic AP VS Percent: 0 %
Brady Statistic AS VP Percent: 99.98 %
Brady Statistic AS VS Percent: 0.02 %
Brady Statistic RA Percent Paced: 0 %
Brady Statistic RV Percent Paced: 99.98 %
Date Time Interrogation Session: 20231128234825
Implantable Lead Connection Status: 753985
Implantable Lead Implant Date: 20210713
Implantable Lead Location: 753860
Implantable Lead Model: 5076
Implantable Pulse Generator Implant Date: 20210713
Lead Channel Impedance Value: 3439 Ohm
Lead Channel Impedance Value: 3439 Ohm
Lead Channel Impedance Value: 361 Ohm
Lead Channel Impedance Value: 437 Ohm
Lead Channel Pacing Threshold Amplitude: 0.625 V
Lead Channel Pacing Threshold Pulse Width: 0.4 ms
Lead Channel Sensing Intrinsic Amplitude: 14.125 mV
Lead Channel Sensing Intrinsic Amplitude: 14.125 mV
Lead Channel Setting Pacing Amplitude: 2 V
Lead Channel Setting Pacing Pulse Width: 0.4 ms
Lead Channel Setting Sensing Sensitivity: 0.9 mV
Zone Setting Status: 755011

## 2022-11-15 DIAGNOSIS — Z66 Do not resuscitate: Secondary | ICD-10-CM | POA: Diagnosis not present

## 2022-11-15 DIAGNOSIS — Z950 Presence of cardiac pacemaker: Secondary | ICD-10-CM | POA: Diagnosis not present

## 2022-11-15 DIAGNOSIS — I999 Unspecified disorder of circulatory system: Secondary | ICD-10-CM | POA: Diagnosis not present

## 2022-11-21 DIAGNOSIS — L570 Actinic keratosis: Secondary | ICD-10-CM | POA: Diagnosis not present

## 2022-11-21 DIAGNOSIS — C4441 Basal cell carcinoma of skin of scalp and neck: Secondary | ICD-10-CM | POA: Diagnosis not present

## 2022-11-21 DIAGNOSIS — L578 Other skin changes due to chronic exposure to nonionizing radiation: Secondary | ICD-10-CM | POA: Diagnosis not present

## 2022-11-21 DIAGNOSIS — Z08 Encounter for follow-up examination after completed treatment for malignant neoplasm: Secondary | ICD-10-CM | POA: Diagnosis not present

## 2022-11-21 DIAGNOSIS — R233 Spontaneous ecchymoses: Secondary | ICD-10-CM | POA: Diagnosis not present

## 2022-11-21 DIAGNOSIS — D492 Neoplasm of unspecified behavior of bone, soft tissue, and skin: Secondary | ICD-10-CM | POA: Diagnosis not present

## 2022-11-21 DIAGNOSIS — Z85828 Personal history of other malignant neoplasm of skin: Secondary | ICD-10-CM | POA: Diagnosis not present

## 2022-12-11 NOTE — Progress Notes (Signed)
Remote pacemaker transmission.   

## 2022-12-23 ENCOUNTER — Other Ambulatory Visit (HOSPITAL_COMMUNITY): Payer: Self-pay | Admitting: Internal Medicine

## 2022-12-24 NOTE — Telephone Encounter (Signed)
Prescription refill request for Eliquis received. Indication:afib Last office visit:4/23 Scr:1.4 Age: 89 Weight:52.6  kg  Prescription refilled

## 2023-01-09 DIAGNOSIS — C4441 Basal cell carcinoma of skin of scalp and neck: Secondary | ICD-10-CM | POA: Diagnosis not present

## 2023-01-24 ENCOUNTER — Encounter (HOSPITAL_COMMUNITY): Payer: Self-pay | Admitting: *Deleted

## 2023-02-13 ENCOUNTER — Ambulatory Visit: Payer: PPO

## 2023-02-13 DIAGNOSIS — I442 Atrioventricular block, complete: Secondary | ICD-10-CM

## 2023-02-13 LAB — CUP PACEART REMOTE DEVICE CHECK
Battery Remaining Longevity: 124 mo
Battery Voltage: 3.02 V
Brady Statistic AP VP Percent: 0 %
Brady Statistic AP VS Percent: 0 %
Brady Statistic AS VP Percent: 99.97 %
Brady Statistic AS VS Percent: 0.03 %
Brady Statistic RA Percent Paced: 0 %
Brady Statistic RV Percent Paced: 99.97 %
Date Time Interrogation Session: 20240227234848
Implantable Lead Connection Status: 753985
Implantable Lead Implant Date: 20210713
Implantable Lead Location: 753860
Implantable Lead Model: 5076
Implantable Pulse Generator Implant Date: 20210713
Lead Channel Impedance Value: 3439 Ohm
Lead Channel Impedance Value: 3439 Ohm
Lead Channel Impedance Value: 361 Ohm
Lead Channel Impedance Value: 437 Ohm
Lead Channel Pacing Threshold Amplitude: 0.625 V
Lead Channel Pacing Threshold Pulse Width: 0.4 ms
Lead Channel Sensing Intrinsic Amplitude: 15.25 mV
Lead Channel Sensing Intrinsic Amplitude: 15.25 mV
Lead Channel Setting Pacing Amplitude: 2 V
Lead Channel Setting Pacing Pulse Width: 0.4 ms
Lead Channel Setting Sensing Sensitivity: 0.9 mV
Zone Setting Status: 755011

## 2023-03-18 NOTE — Progress Notes (Signed)
Remote pacemaker transmission.   

## 2023-03-20 DIAGNOSIS — E039 Hypothyroidism, unspecified: Secondary | ICD-10-CM | POA: Diagnosis not present

## 2023-03-20 DIAGNOSIS — I10 Essential (primary) hypertension: Secondary | ICD-10-CM | POA: Diagnosis not present

## 2023-03-20 DIAGNOSIS — E785 Hyperlipidemia, unspecified: Secondary | ICD-10-CM | POA: Diagnosis not present

## 2023-03-27 DIAGNOSIS — Z0000 Encounter for general adult medical examination without abnormal findings: Secondary | ICD-10-CM | POA: Diagnosis not present

## 2023-03-27 DIAGNOSIS — K922 Gastrointestinal hemorrhage, unspecified: Secondary | ICD-10-CM | POA: Diagnosis not present

## 2023-03-27 DIAGNOSIS — Z1339 Encounter for screening examination for other mental health and behavioral disorders: Secondary | ICD-10-CM | POA: Diagnosis not present

## 2023-03-27 DIAGNOSIS — I10 Essential (primary) hypertension: Secondary | ICD-10-CM | POA: Diagnosis not present

## 2023-03-27 DIAGNOSIS — N1832 Chronic kidney disease, stage 3b: Secondary | ICD-10-CM | POA: Diagnosis not present

## 2023-03-27 DIAGNOSIS — I129 Hypertensive chronic kidney disease with stage 1 through stage 4 chronic kidney disease, or unspecified chronic kidney disease: Secondary | ICD-10-CM | POA: Diagnosis not present

## 2023-03-27 DIAGNOSIS — Z23 Encounter for immunization: Secondary | ICD-10-CM | POA: Diagnosis not present

## 2023-03-27 DIAGNOSIS — M810 Age-related osteoporosis without current pathological fracture: Secondary | ICD-10-CM | POA: Diagnosis not present

## 2023-03-27 DIAGNOSIS — Z1331 Encounter for screening for depression: Secondary | ICD-10-CM | POA: Diagnosis not present

## 2023-03-27 DIAGNOSIS — I4891 Unspecified atrial fibrillation: Secondary | ICD-10-CM | POA: Diagnosis not present

## 2023-03-27 DIAGNOSIS — I442 Atrioventricular block, complete: Secondary | ICD-10-CM | POA: Diagnosis not present

## 2023-03-27 DIAGNOSIS — E039 Hypothyroidism, unspecified: Secondary | ICD-10-CM | POA: Diagnosis not present

## 2023-03-27 DIAGNOSIS — I701 Atherosclerosis of renal artery: Secondary | ICD-10-CM | POA: Diagnosis not present

## 2023-04-02 DIAGNOSIS — L821 Other seborrheic keratosis: Secondary | ICD-10-CM | POA: Diagnosis not present

## 2023-04-02 DIAGNOSIS — D225 Melanocytic nevi of trunk: Secondary | ICD-10-CM | POA: Diagnosis not present

## 2023-04-02 DIAGNOSIS — L814 Other melanin hyperpigmentation: Secondary | ICD-10-CM | POA: Diagnosis not present

## 2023-04-02 DIAGNOSIS — L570 Actinic keratosis: Secondary | ICD-10-CM | POA: Diagnosis not present

## 2023-04-02 DIAGNOSIS — D0472 Carcinoma in situ of skin of left lower limb, including hip: Secondary | ICD-10-CM | POA: Diagnosis not present

## 2023-04-02 DIAGNOSIS — C44729 Squamous cell carcinoma of skin of left lower limb, including hip: Secondary | ICD-10-CM | POA: Diagnosis not present

## 2023-04-04 ENCOUNTER — Other Ambulatory Visit: Payer: Self-pay | Admitting: Internal Medicine

## 2023-04-10 DIAGNOSIS — H02834 Dermatochalasis of left upper eyelid: Secondary | ICD-10-CM | POA: Diagnosis not present

## 2023-04-10 DIAGNOSIS — H04123 Dry eye syndrome of bilateral lacrimal glands: Secondary | ICD-10-CM | POA: Diagnosis not present

## 2023-04-10 DIAGNOSIS — H40053 Ocular hypertension, bilateral: Secondary | ICD-10-CM | POA: Diagnosis not present

## 2023-04-10 DIAGNOSIS — H2513 Age-related nuclear cataract, bilateral: Secondary | ICD-10-CM | POA: Diagnosis not present

## 2023-04-10 DIAGNOSIS — H02831 Dermatochalasis of right upper eyelid: Secondary | ICD-10-CM | POA: Diagnosis not present

## 2023-04-16 DIAGNOSIS — N2581 Secondary hyperparathyroidism of renal origin: Secondary | ICD-10-CM | POA: Diagnosis not present

## 2023-04-16 DIAGNOSIS — D631 Anemia in chronic kidney disease: Secondary | ICD-10-CM | POA: Diagnosis not present

## 2023-04-16 DIAGNOSIS — N1832 Chronic kidney disease, stage 3b: Secondary | ICD-10-CM | POA: Diagnosis not present

## 2023-04-16 DIAGNOSIS — I4891 Unspecified atrial fibrillation: Secondary | ICD-10-CM | POA: Diagnosis not present

## 2023-04-16 DIAGNOSIS — N189 Chronic kidney disease, unspecified: Secondary | ICD-10-CM | POA: Diagnosis not present

## 2023-04-16 DIAGNOSIS — I129 Hypertensive chronic kidney disease with stage 1 through stage 4 chronic kidney disease, or unspecified chronic kidney disease: Secondary | ICD-10-CM | POA: Diagnosis not present

## 2023-04-16 DIAGNOSIS — I509 Heart failure, unspecified: Secondary | ICD-10-CM | POA: Diagnosis not present

## 2023-05-15 ENCOUNTER — Ambulatory Visit (INDEPENDENT_AMBULATORY_CARE_PROVIDER_SITE_OTHER): Payer: PPO

## 2023-05-15 DIAGNOSIS — I442 Atrioventricular block, complete: Secondary | ICD-10-CM

## 2023-05-20 LAB — CUP PACEART REMOTE DEVICE CHECK
Battery Remaining Longevity: 121 mo
Battery Voltage: 3.02 V
Brady Statistic AP VP Percent: 0 %
Brady Statistic AP VS Percent: 0 %
Brady Statistic AS VP Percent: 99.89 %
Brady Statistic AS VS Percent: 0.11 %
Brady Statistic RA Percent Paced: 0 %
Brady Statistic RV Percent Paced: 99.89 %
Date Time Interrogation Session: 20240601024520
Implantable Lead Connection Status: 753985
Implantable Lead Implant Date: 20210713
Implantable Lead Location: 753860
Implantable Lead Model: 5076
Implantable Pulse Generator Implant Date: 20210713
Lead Channel Impedance Value: 3439 Ohm
Lead Channel Impedance Value: 3439 Ohm
Lead Channel Impedance Value: 380 Ohm
Lead Channel Impedance Value: 437 Ohm
Lead Channel Pacing Threshold Amplitude: 0.75 V
Lead Channel Pacing Threshold Pulse Width: 0.4 ms
Lead Channel Sensing Intrinsic Amplitude: 15.25 mV
Lead Channel Sensing Intrinsic Amplitude: 15.25 mV
Lead Channel Setting Pacing Amplitude: 2 V
Lead Channel Setting Pacing Pulse Width: 0.4 ms
Lead Channel Setting Sensing Sensitivity: 0.9 mV
Zone Setting Status: 755011

## 2023-06-06 NOTE — Progress Notes (Signed)
Remote pacemaker transmission.   

## 2023-06-17 ENCOUNTER — Other Ambulatory Visit: Payer: Self-pay | Admitting: Internal Medicine

## 2023-06-28 DIAGNOSIS — D692 Other nonthrombocytopenic purpura: Secondary | ICD-10-CM | POA: Diagnosis not present

## 2023-06-28 DIAGNOSIS — I25118 Atherosclerotic heart disease of native coronary artery with other forms of angina pectoris: Secondary | ICD-10-CM | POA: Diagnosis not present

## 2023-06-28 DIAGNOSIS — Z87891 Personal history of nicotine dependence: Secondary | ICD-10-CM | POA: Diagnosis not present

## 2023-06-28 DIAGNOSIS — I129 Hypertensive chronic kidney disease with stage 1 through stage 4 chronic kidney disease, or unspecified chronic kidney disease: Secondary | ICD-10-CM | POA: Diagnosis not present

## 2023-06-28 DIAGNOSIS — N1832 Chronic kidney disease, stage 3b: Secondary | ICD-10-CM | POA: Diagnosis not present

## 2023-06-28 DIAGNOSIS — E039 Hypothyroidism, unspecified: Secondary | ICD-10-CM | POA: Diagnosis not present

## 2023-07-11 ENCOUNTER — Other Ambulatory Visit: Payer: Self-pay | Admitting: Internal Medicine

## 2023-07-17 ENCOUNTER — Other Ambulatory Visit (HOSPITAL_COMMUNITY): Payer: Self-pay | Admitting: Internal Medicine

## 2023-07-17 DIAGNOSIS — I4819 Other persistent atrial fibrillation: Secondary | ICD-10-CM

## 2023-07-17 NOTE — Telephone Encounter (Signed)
Eliquis 2.5mg  refill request received. Patient is 89 years old, weight-52.6kg, Crea-1.28 on 04/16/23 via [employer], Diagnosis-Afib, and last seen by Dr. Ladona Ridgel on 03/26/22 and has an appt pending on 07/25/23. Dose is appropriate based on dosing criteria. Will send in refill to requested pharmacy.

## 2023-07-25 ENCOUNTER — Ambulatory Visit: Payer: PPO | Admitting: Internal Medicine

## 2023-07-28 ENCOUNTER — Other Ambulatory Visit: Payer: Self-pay | Admitting: Internal Medicine

## 2023-08-06 DIAGNOSIS — H02834 Dermatochalasis of left upper eyelid: Secondary | ICD-10-CM | POA: Diagnosis not present

## 2023-08-06 DIAGNOSIS — H02831 Dermatochalasis of right upper eyelid: Secondary | ICD-10-CM | POA: Diagnosis not present

## 2023-08-06 DIAGNOSIS — H2513 Age-related nuclear cataract, bilateral: Secondary | ICD-10-CM | POA: Diagnosis not present

## 2023-08-06 DIAGNOSIS — H04123 Dry eye syndrome of bilateral lacrimal glands: Secondary | ICD-10-CM | POA: Diagnosis not present

## 2023-08-06 DIAGNOSIS — H40053 Ocular hypertension, bilateral: Secondary | ICD-10-CM | POA: Diagnosis not present

## 2023-08-06 DIAGNOSIS — H1131 Conjunctival hemorrhage, right eye: Secondary | ICD-10-CM | POA: Diagnosis not present

## 2023-08-14 ENCOUNTER — Ambulatory Visit (INDEPENDENT_AMBULATORY_CARE_PROVIDER_SITE_OTHER): Payer: PPO

## 2023-08-14 DIAGNOSIS — I442 Atrioventricular block, complete: Secondary | ICD-10-CM | POA: Diagnosis not present

## 2023-08-15 LAB — CUP PACEART REMOTE DEVICE CHECK
Battery Remaining Longevity: 117 mo
Battery Voltage: 3.02 V
Brady Statistic AP VP Percent: 0 %
Brady Statistic AP VS Percent: 0 %
Brady Statistic AS VP Percent: 99.97 %
Brady Statistic AS VS Percent: 0.03 %
Brady Statistic RA Percent Paced: 0 %
Brady Statistic RV Percent Paced: 99.97 %
Date Time Interrogation Session: 20240829123617
Implantable Lead Connection Status: 753985
Implantable Lead Implant Date: 20210713
Implantable Lead Location: 753860
Implantable Lead Model: 5076
Implantable Pulse Generator Implant Date: 20210713
Lead Channel Impedance Value: 3439 Ohm
Lead Channel Impedance Value: 3439 Ohm
Lead Channel Impedance Value: 361 Ohm
Lead Channel Impedance Value: 418 Ohm
Lead Channel Pacing Threshold Amplitude: 0.75 V
Lead Channel Pacing Threshold Pulse Width: 0.4 ms
Lead Channel Sensing Intrinsic Amplitude: 15.25 mV
Lead Channel Sensing Intrinsic Amplitude: 15.25 mV
Lead Channel Setting Pacing Amplitude: 2 V
Lead Channel Setting Pacing Pulse Width: 0.4 ms
Lead Channel Setting Sensing Sensitivity: 0.9 mV
Zone Setting Status: 755011

## 2023-08-20 DIAGNOSIS — H04123 Dry eye syndrome of bilateral lacrimal glands: Secondary | ICD-10-CM | POA: Diagnosis not present

## 2023-08-20 DIAGNOSIS — H40053 Ocular hypertension, bilateral: Secondary | ICD-10-CM | POA: Diagnosis not present

## 2023-08-20 DIAGNOSIS — H0012 Chalazion right lower eyelid: Secondary | ICD-10-CM | POA: Diagnosis not present

## 2023-08-20 DIAGNOSIS — H02834 Dermatochalasis of left upper eyelid: Secondary | ICD-10-CM | POA: Diagnosis not present

## 2023-08-20 DIAGNOSIS — H2513 Age-related nuclear cataract, bilateral: Secondary | ICD-10-CM | POA: Diagnosis not present

## 2023-08-20 DIAGNOSIS — H02831 Dermatochalasis of right upper eyelid: Secondary | ICD-10-CM | POA: Diagnosis not present

## 2023-08-23 NOTE — Progress Notes (Signed)
Remote pacemaker transmission.   

## 2023-09-26 DIAGNOSIS — N2581 Secondary hyperparathyroidism of renal origin: Secondary | ICD-10-CM | POA: Diagnosis not present

## 2023-09-26 DIAGNOSIS — N1832 Chronic kidney disease, stage 3b: Secondary | ICD-10-CM | POA: Diagnosis not present

## 2023-09-26 DIAGNOSIS — I4891 Unspecified atrial fibrillation: Secondary | ICD-10-CM | POA: Diagnosis not present

## 2023-09-26 DIAGNOSIS — I509 Heart failure, unspecified: Secondary | ICD-10-CM | POA: Diagnosis not present

## 2023-09-26 DIAGNOSIS — I129 Hypertensive chronic kidney disease with stage 1 through stage 4 chronic kidney disease, or unspecified chronic kidney disease: Secondary | ICD-10-CM | POA: Diagnosis not present

## 2023-09-26 DIAGNOSIS — D631 Anemia in chronic kidney disease: Secondary | ICD-10-CM | POA: Diagnosis not present

## 2023-09-30 DIAGNOSIS — H04123 Dry eye syndrome of bilateral lacrimal glands: Secondary | ICD-10-CM | POA: Diagnosis not present

## 2023-09-30 DIAGNOSIS — H02834 Dermatochalasis of left upper eyelid: Secondary | ICD-10-CM | POA: Diagnosis not present

## 2023-09-30 DIAGNOSIS — H401111 Primary open-angle glaucoma, right eye, mild stage: Secondary | ICD-10-CM | POA: Diagnosis not present

## 2023-09-30 DIAGNOSIS — H2513 Age-related nuclear cataract, bilateral: Secondary | ICD-10-CM | POA: Diagnosis not present

## 2023-09-30 DIAGNOSIS — H02831 Dermatochalasis of right upper eyelid: Secondary | ICD-10-CM | POA: Diagnosis not present

## 2023-10-21 ENCOUNTER — Ambulatory Visit: Payer: PPO | Attending: Internal Medicine | Admitting: Internal Medicine

## 2023-10-21 ENCOUNTER — Encounter: Payer: Self-pay | Admitting: Internal Medicine

## 2023-10-21 VITALS — BP 128/84 | HR 81 | Ht 62.5 in | Wt 119.0 lb

## 2023-10-21 DIAGNOSIS — I4819 Other persistent atrial fibrillation: Secondary | ICD-10-CM

## 2023-10-21 LAB — CUP PACEART INCLINIC DEVICE CHECK
Date Time Interrogation Session: 20241104205234
Implantable Lead Connection Status: 753985
Implantable Lead Implant Date: 20210713
Implantable Lead Location: 753860
Implantable Lead Model: 5076
Implantable Pulse Generator Implant Date: 20210713

## 2023-10-21 NOTE — Progress Notes (Signed)
HPI Brandi Chambers returns today for followup. She is a pleasant 89 yo woman with a h/o CHB, s/p PPM Insertion with early RV lead failure and was then found to have an occluded left SCV. She had extraction of her atrial lead and insertion of a new RV lead but due to scar tissue could not get another atrial lead in place. 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 In the interim, her diuretic was reduced to 40 qam and 20 qpm and she feels better. She has reverted to atrial fib. She is taking her beta blocker but stopped taking the amiodarone. She denies chest pain but does have sob with exertion. Overall she feels well though she gets tired a little faster than she would like. Allergies  Allergen Reactions   Alendronate Sodium Other (See Comments)    GI UPSET     Current Outpatient Medications  Medication Sig Dispense Refill   amLODipine (NORVASC) 2.5 MG tablet Take 2.5 mg by mouth daily.   3   ELIQUIS 2.5 MG TABS tablet TAKE 1 TABLET BY MOUTH TWICE A DAY 60 tablet 5   furosemide (LASIX) 40 MG tablet TAKE 1 TABLET BY MOUTH EVERY DAY 90 tablet 1   latanoprost (XALATAN) 0.005 % ophthalmic solution Place 1 drop into both eyes at bedtime.     levothyroxine (SYNTHROID, LEVOTHROID) 75 MCG tablet Take 75 mcg by mouth daily before breakfast.     metoprolol succinate (TOPROL-XL) 25 MG 24 hr tablet TAKE 1 TABLET BY MOUTH DAILY. PLEASE KEEP SCHEDULED APPOINTMENT FOR FUTURE REFILLS. THANK YOU. 90 tablet 1   nitroGLYCERIN (NITROSTAT) 0.4 MG SL tablet Place 0.4 mg under the tongue every 5 (five) minutes x 3 doses as needed for chest pain.     No current facility-administered medications for this visit.     Past Medical History:  Diagnosis Date   Arthritis    RA IN HANDS   Chronic congestive heart failure with left ventricular diastolic dysfunction (HCC)    CKD (chronic kidney disease)    Complete heart block (HCC)    Hypertension     Hypoglycemia    Hypothyroidism    Nephrolithiasis    Osteoporosis    Persistent atrial fibrillation (HCC)    Presence of permanent cardiac pacemaker    Thyroid disease    Typical atrial flutter (HCC)     ROS:   All systems reviewed and negative except as noted in the HPI.   Past Surgical History:  Procedure Laterality Date   APPENDECTOMY     CARDIOVERSION N/A 05/14/2017   Procedure: CARDIOVERSION;  Surgeon: Chilton Si, MD;  Location: [facility] ENDOSCOPY;  Service: Cardiovascular;  Laterality: N/A;   CARDIOVERSION N/A 07/23/2018   Procedure: CARDIOVERSION;  Surgeon: Lewayne Bunting, MD;  Location: [facility] ENDOSCOPY;  Service: Cardiovascular;  Laterality: N/A;   INSERT / REPLACE / REMOVE PACEMAKER     LEAD REVISION/REPAIR N/A 06/09/2020   Procedure: LEAD REVISION/REPAIR;  Surgeon: Hillis Range, MD;  Location: [facility] INVASIVE CV LAB;  Service: Cardiovascular;  Laterality: N/A;   PACEMAKER IMPLANT N/A 06/06/2017   Procedure: Pacemaker Implant;  Surgeon: Hillis Range, MD;  Location: [facility] INVASIVE CV LAB;  Service: Cardiovascular;  Laterality: N/A;   PACEMAKER REVISION N/A 06/28/2020   Procedure: PACEMAKER SYSTEM REVISION;  Surgeon: Marinus Maw, MD;  Location: [facility] INVASIVE CV LAB;  Service: Cardiovascular;  Laterality: N/A;  BARTLE BACK UP  PPM GENERATOR REMOVAL N/A 06/09/2020   Procedure: PPM GENERATOR REMOVAL;  Surgeon: Hillis Range, MD;  Location: [facility] INVASIVE CV LAB;  Service: Cardiovascular;  Laterality: N/A;     Family History  Problem Relation Age of Onset   Breast cancer Mother    Lung cancer Father      Social History   Socioeconomic History   Marital status: Married    Spouse name: Not on file   Number of children: 1   Years of education: Not on file   Highest education level: Not on file  Occupational History   Not on file  Tobacco Use   Smoking status: Former    Current packs/day: 0.00    Types: Cigarettes    Quit date: 05/18/1969    Years since quitting: 54.4    Smokeless tobacco: Never  Vaping Use   Vaping status: Never Used  Substance and Sexual Activity   Alcohol use: No   Drug use: No   Sexual activity: Not on file  Other Topics Concern   Not on file  Social History Narrative   Lives in [place] with her spouse.  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[employer] Strain: Not on [employer] Insecurity: Not on file  Transportation Needs: Not on file  Physical Activity: Not on file  Stress: Not on file  Social Connections: Not on file  Intimate Partner Violence: Not on file     BP 128/84   Pulse 81   Ht 5' 2.5" (1.588 m)   Wt 119 lb (54 kg)   LMP  (LMP Unknown)   SpO2 98%   BMI 21.42 kg/m   Physical Exam:  Well appearing NAD HEENT: Unremarkable Neck:  No JVD, no thyromegally Lymphatics:  No adenopathy Back:  No CVA tenderness Lungs:  Clear with no wheezes HEART:  Regular rate rhythm, no murmurs, no rubs, no clicks Abd:  soft, positive bowel sounds, no organomegally, no rebound, no guarding Ext:  2 plus pulses, no edema, no cyanosis, no clubbing Skin:  No rashes no nodules Neuro:  CN II through XII intact, motor grossly intact  EKG - atrial fib with ventricular pacing  Assess/Plan: 1. CHB - she is stable after PPM revision. 2. PPM - she has a single lead in the ventricle. Her single medtronic PPM is working normally 3. 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  4. HTN -her sbp is better. She will maintain a low sodium diet.   Sharlot Gowda Latina Frank,MD

## 2023-10-21 NOTE — Patient Instructions (Signed)
Medication Instructions:  Your physician recommends that you continue on your current medications as directed. Please refer to the Current Medication list given to you today.  *If you need a refill on your cardiac medications before your next appointment, please call your pharmacy*  Lab Work: None ordered.  If you have labs (blood work) drawn today and your tests are completely normal, you will receive your results only by: MyChart Message (if you have MyChart) OR A paper copy in the mail If you have any lab test that is abnormal or we need to change your treatment, we will call you to review the results.  Testing/Procedures: None ordered.  Follow-Up: At [facility], you and your health needs are our priority.  As part of our continuing mission to provide you with exceptional heart care, we have created designated Provider Care Teams.  These Care Teams include your primary Cardiologist (physician) and Advanced Practice Providers (APPs -  Physician Assistants and Nurse Practitioners) who all work together to provide you with the care you need, when you need it.  We recommend signing up for the patient portal called "MyChart".  Sign up information is provided on this After Visit Summary.  MyChart is used to connect with patients for Virtual Visits (Telemedicine).  Patients are able to view lab/test results, encounter notes, upcoming appointments, etc.  Non-urgent messages can be sent to your provider as well.   To learn more about what you can do with MyChart, go to [email or web address].    Your next appointment:   1 year(s)  The format for your next appointment:   In Person  Provider:   Lewayne Bunting, MD{or one of the following Advanced Practice Providers on your designated Care Team:   Francis Dowse, [place] Casimiro Needle "Mardelle Matte" [place], [place] Earnest Rosier, NP  Remote monitoring is used to monitor your Pacemaker/ ICD from home. This monitoring reduces the number of office visits required  to check your device to one time per year. It allows [place] to keep an eye on the functioning of your device to ensure it is working properly.   Important Information About Sugar

## 2023-11-07 DIAGNOSIS — D225 Melanocytic nevi of trunk: Secondary | ICD-10-CM | POA: Diagnosis not present

## 2023-11-07 DIAGNOSIS — Z85828 Personal history of other malignant neoplasm of skin: Secondary | ICD-10-CM | POA: Diagnosis not present

## 2023-11-07 DIAGNOSIS — Z08 Encounter for follow-up examination after completed treatment for malignant neoplasm: Secondary | ICD-10-CM | POA: Diagnosis not present

## 2023-11-07 DIAGNOSIS — L821 Other seborrheic keratosis: Secondary | ICD-10-CM | POA: Diagnosis not present

## 2023-11-07 DIAGNOSIS — L814 Other melanin hyperpigmentation: Secondary | ICD-10-CM | POA: Diagnosis not present

## 2023-11-07 DIAGNOSIS — L570 Actinic keratosis: Secondary | ICD-10-CM | POA: Diagnosis not present

## 2023-11-13 ENCOUNTER — Other Ambulatory Visit: Payer: Self-pay | Admitting: Internal Medicine

## 2023-11-13 ENCOUNTER — Ambulatory Visit (INDEPENDENT_AMBULATORY_CARE_PROVIDER_SITE_OTHER): Payer: PPO

## 2023-11-13 DIAGNOSIS — I442 Atrioventricular block, complete: Secondary | ICD-10-CM | POA: Diagnosis not present

## 2023-11-13 LAB — CUP PACEART REMOTE DEVICE CHECK
Battery Remaining Longevity: 113 mo
Battery Voltage: 3.02 V
Brady Statistic AP VP Percent: 0 %
Brady Statistic AP VS Percent: 0 %
Brady Statistic AS VP Percent: 99.97 %
Brady Statistic AS VS Percent: 0.03 %
Brady Statistic RA Percent Paced: 0 %
Brady Statistic RV Percent Paced: 99.97 %
Date Time Interrogation Session: 20241126234830
Implantable Lead Connection Status: 753985
Implantable Lead Implant Date: 20210713
Implantable Lead Location: 753860
Implantable Lead Model: 5076
Implantable Pulse Generator Implant Date: 20210713
Lead Channel Impedance Value: 323 Ohm
Lead Channel Impedance Value: 3439 Ohm
Lead Channel Impedance Value: 3439 Ohm
Lead Channel Impedance Value: 399 Ohm
Lead Channel Pacing Threshold Amplitude: 0.75 V
Lead Channel Pacing Threshold Pulse Width: 0.4 ms
Lead Channel Sensing Intrinsic Amplitude: 15.25 mV
Lead Channel Sensing Intrinsic Amplitude: 15.25 mV
Lead Channel Setting Pacing Amplitude: 2 V
Lead Channel Setting Pacing Pulse Width: 0.4 ms
Lead Channel Setting Sensing Sensitivity: 0.9 mV
Zone Setting Status: 755011

## 2024-01-08 ENCOUNTER — Other Ambulatory Visit: Payer: Self-pay | Admitting: Internal Medicine

## 2024-01-08 DIAGNOSIS — I4819 Other persistent atrial fibrillation: Secondary | ICD-10-CM

## 2024-01-09 NOTE — Telephone Encounter (Signed)
Eliquis 2.5mg  refill request received. Patient is 89 years old, weight-54kg, Crea-1.27 on 09/26/23 via [employer] tab from [employer], Diagnosis-Afib, and last seen by Dr. Ladona Ridgel on 10/21/23. Dose is appropriate based on dosing criteria. Will send in refill to requested pharmacy.

## 2024-01-26 ENCOUNTER — Other Ambulatory Visit: Payer: Self-pay | Admitting: Internal Medicine

## 2024-02-04 DIAGNOSIS — H2513 Age-related nuclear cataract, bilateral: Secondary | ICD-10-CM | POA: Diagnosis not present

## 2024-02-04 DIAGNOSIS — H40052 Ocular hypertension, left eye: Secondary | ICD-10-CM | POA: Diagnosis not present

## 2024-02-04 DIAGNOSIS — H02834 Dermatochalasis of left upper eyelid: Secondary | ICD-10-CM | POA: Diagnosis not present

## 2024-02-04 DIAGNOSIS — H04123 Dry eye syndrome of bilateral lacrimal glands: Secondary | ICD-10-CM | POA: Diagnosis not present

## 2024-02-04 DIAGNOSIS — H401111 Primary open-angle glaucoma, right eye, mild stage: Secondary | ICD-10-CM | POA: Diagnosis not present

## 2024-02-04 DIAGNOSIS — H02831 Dermatochalasis of right upper eyelid: Secondary | ICD-10-CM | POA: Diagnosis not present

## 2024-02-12 ENCOUNTER — Ambulatory Visit (INDEPENDENT_AMBULATORY_CARE_PROVIDER_SITE_OTHER): Payer: PPO

## 2024-02-12 DIAGNOSIS — I442 Atrioventricular block, complete: Secondary | ICD-10-CM | POA: Diagnosis not present

## 2024-02-14 LAB — CUP PACEART REMOTE DEVICE CHECK
Battery Remaining Longevity: 112 mo
Battery Voltage: 3.02 V
Brady Statistic AP VP Percent: 0 %
Brady Statistic AP VS Percent: 0 %
Brady Statistic AS VP Percent: 99.97 %
Brady Statistic AS VS Percent: 0.03 %
Brady Statistic RA Percent Paced: 0 %
Brady Statistic RV Percent Paced: 99.97 %
Date Time Interrogation Session: 20250227154017
Implantable Lead Connection Status: 753985
Implantable Lead Implant Date: 20210713
Implantable Lead Location: 753860
Implantable Lead Model: 5076
Implantable Pulse Generator Implant Date: 20210713
Lead Channel Impedance Value: 3439 Ohm
Lead Channel Impedance Value: 3439 Ohm
Lead Channel Impedance Value: 361 Ohm
Lead Channel Impedance Value: 437 Ohm
Lead Channel Pacing Threshold Amplitude: 0.75 V
Lead Channel Pacing Threshold Pulse Width: 0.4 ms
Lead Channel Sensing Intrinsic Amplitude: 15.875 mV
Lead Channel Sensing Intrinsic Amplitude: 15.875 mV
Lead Channel Setting Pacing Amplitude: 2 V
Lead Channel Setting Pacing Pulse Width: 0.4 ms
Lead Channel Setting Sensing Sensitivity: 0.9 mV
Zone Setting Status: 755011

## 2024-03-17 NOTE — Progress Notes (Signed)
 Remote pacemaker transmission.

## 2024-03-17 NOTE — Addendum Note (Signed)
 Addended by: Elease Etienne A on: 03/17/2024 12:34 PM   Modules accepted: Orders

## 2024-03-25 DIAGNOSIS — E785 Hyperlipidemia, unspecified: Secondary | ICD-10-CM | POA: Diagnosis not present

## 2024-03-25 DIAGNOSIS — D631 Anemia in chronic kidney disease: Secondary | ICD-10-CM | POA: Diagnosis not present

## 2024-03-25 DIAGNOSIS — N1832 Chronic kidney disease, stage 3b: Secondary | ICD-10-CM | POA: Diagnosis not present

## 2024-03-25 DIAGNOSIS — N2581 Secondary hyperparathyroidism of renal origin: Secondary | ICD-10-CM | POA: Diagnosis not present

## 2024-03-25 DIAGNOSIS — I129 Hypertensive chronic kidney disease with stage 1 through stage 4 chronic kidney disease, or unspecified chronic kidney disease: Secondary | ICD-10-CM | POA: Diagnosis not present

## 2024-03-25 DIAGNOSIS — E039 Hypothyroidism, unspecified: Secondary | ICD-10-CM | POA: Diagnosis not present

## 2024-03-25 DIAGNOSIS — I509 Heart failure, unspecified: Secondary | ICD-10-CM | POA: Diagnosis not present

## 2024-03-25 DIAGNOSIS — I4891 Unspecified atrial fibrillation: Secondary | ICD-10-CM | POA: Diagnosis not present

## 2024-03-25 DIAGNOSIS — M810 Age-related osteoporosis without current pathological fracture: Secondary | ICD-10-CM | POA: Diagnosis not present

## 2024-04-02 DIAGNOSIS — M810 Age-related osteoporosis without current pathological fracture: Secondary | ICD-10-CM | POA: Diagnosis not present

## 2024-04-02 DIAGNOSIS — Z1339 Encounter for screening examination for other mental health and behavioral disorders: Secondary | ICD-10-CM | POA: Diagnosis not present

## 2024-04-02 DIAGNOSIS — I442 Atrioventricular block, complete: Secondary | ICD-10-CM | POA: Diagnosis not present

## 2024-04-02 DIAGNOSIS — L989 Disorder of the skin and subcutaneous tissue, unspecified: Secondary | ICD-10-CM | POA: Diagnosis not present

## 2024-04-02 DIAGNOSIS — Z0000 Encounter for general adult medical examination without abnormal findings: Secondary | ICD-10-CM | POA: Diagnosis not present

## 2024-04-02 DIAGNOSIS — Z1331 Encounter for screening for depression: Secondary | ICD-10-CM | POA: Diagnosis not present

## 2024-04-02 DIAGNOSIS — I4891 Unspecified atrial fibrillation: Secondary | ICD-10-CM | POA: Diagnosis not present

## 2024-04-02 DIAGNOSIS — I701 Atherosclerosis of renal artery: Secondary | ICD-10-CM | POA: Diagnosis not present

## 2024-04-02 DIAGNOSIS — N1832 Chronic kidney disease, stage 3b: Secondary | ICD-10-CM | POA: Diagnosis not present

## 2024-04-02 DIAGNOSIS — H903 Sensorineural hearing loss, bilateral: Secondary | ICD-10-CM | POA: Diagnosis not present

## 2024-04-02 DIAGNOSIS — E039 Hypothyroidism, unspecified: Secondary | ICD-10-CM | POA: Diagnosis not present

## 2024-04-02 DIAGNOSIS — I129 Hypertensive chronic kidney disease with stage 1 through stage 4 chronic kidney disease, or unspecified chronic kidney disease: Secondary | ICD-10-CM | POA: Diagnosis not present

## 2024-05-06 DIAGNOSIS — L578 Other skin changes due to chronic exposure to nonionizing radiation: Secondary | ICD-10-CM | POA: Diagnosis not present

## 2024-05-06 DIAGNOSIS — L570 Actinic keratosis: Secondary | ICD-10-CM | POA: Diagnosis not present

## 2024-05-13 ENCOUNTER — Ambulatory Visit (INDEPENDENT_AMBULATORY_CARE_PROVIDER_SITE_OTHER): Payer: PPO

## 2024-05-13 DIAGNOSIS — I442 Atrioventricular block, complete: Secondary | ICD-10-CM | POA: Diagnosis not present

## 2024-05-14 ENCOUNTER — Ambulatory Visit: Payer: Self-pay | Admitting: Internal Medicine

## 2024-05-14 LAB — CUP PACEART REMOTE DEVICE CHECK
Battery Remaining Longevity: 107 mo
Battery Voltage: 3.01 V
Brady Statistic AP VP Percent: 0 %
Brady Statistic AP VS Percent: 0 %
Brady Statistic AS VP Percent: 99.97 %
Brady Statistic AS VS Percent: 0.03 %
Brady Statistic RA Percent Paced: 0 %
Brady Statistic RV Percent Paced: 99.97 %
Date Time Interrogation Session: 20250528004823
Implantable Lead Connection Status: 753985
Implantable Lead Implant Date: 20210713
Implantable Lead Location: 753860
Implantable Lead Model: 5076
Implantable Pulse Generator Implant Date: 20210713
Lead Channel Impedance Value: 342 Ohm
Lead Channel Impedance Value: 3439 Ohm
Lead Channel Impedance Value: 3439 Ohm
Lead Channel Impedance Value: 399 Ohm
Lead Channel Pacing Threshold Amplitude: 0.75 V
Lead Channel Pacing Threshold Pulse Width: 0.4 ms
Lead Channel Sensing Intrinsic Amplitude: 15.875 mV
Lead Channel Sensing Intrinsic Amplitude: 15.875 mV
Lead Channel Setting Pacing Amplitude: 2 V
Lead Channel Setting Pacing Pulse Width: 0.4 ms
Lead Channel Setting Sensing Sensitivity: 0.9 mV
Zone Setting Status: 755011

## 2024-06-03 DIAGNOSIS — H2513 Age-related nuclear cataract, bilateral: Secondary | ICD-10-CM | POA: Diagnosis not present

## 2024-06-03 DIAGNOSIS — H02831 Dermatochalasis of right upper eyelid: Secondary | ICD-10-CM | POA: Diagnosis not present

## 2024-06-03 DIAGNOSIS — H04123 Dry eye syndrome of bilateral lacrimal glands: Secondary | ICD-10-CM | POA: Diagnosis not present

## 2024-06-03 DIAGNOSIS — H401111 Primary open-angle glaucoma, right eye, mild stage: Secondary | ICD-10-CM | POA: Diagnosis not present

## 2024-06-03 DIAGNOSIS — H40052 Ocular hypertension, left eye: Secondary | ICD-10-CM | POA: Diagnosis not present

## 2024-06-03 DIAGNOSIS — H02834 Dermatochalasis of left upper eyelid: Secondary | ICD-10-CM | POA: Diagnosis not present

## 2024-07-06 ENCOUNTER — Other Ambulatory Visit: Payer: Self-pay

## 2024-07-06 DIAGNOSIS — I4819 Other persistent atrial fibrillation: Secondary | ICD-10-CM

## 2024-07-06 MED ORDER — APIXABAN 2.5 MG PO TABS: 2.5000 mg | ORAL_TABLET | Freq: Two times a day (BID) | ORAL | 5 refills | Status: DC

## 2024-07-06 NOTE — Progress Notes (Signed)
 Remote pacemaker transmission.

## 2024-07-06 NOTE — Addendum Note (Signed)
 Addended by: VICCI SELLER A on: 07/06/2024 11:37 AM   Modules accepted: Orders

## 2024-07-06 NOTE — Telephone Encounter (Signed)
 Prescription refill request for Eliquis  received. Indication:afib Last office visit:11/24 Scr:1.3  4/25 Age: 89 Weight:54  kg  Prescription refilled

## 2024-07-09 DIAGNOSIS — H401111 Primary open-angle glaucoma, right eye, mild stage: Secondary | ICD-10-CM | POA: Diagnosis not present

## 2024-07-15 DIAGNOSIS — H903 Sensorineural hearing loss, bilateral: Secondary | ICD-10-CM | POA: Diagnosis not present

## 2024-07-22 DIAGNOSIS — H903 Sensorineural hearing loss, bilateral: Secondary | ICD-10-CM | POA: Diagnosis not present

## 2024-07-23 DIAGNOSIS — H02834 Dermatochalasis of left upper eyelid: Secondary | ICD-10-CM | POA: Diagnosis not present

## 2024-07-23 DIAGNOSIS — H04123 Dry eye syndrome of bilateral lacrimal glands: Secondary | ICD-10-CM | POA: Diagnosis not present

## 2024-07-23 DIAGNOSIS — H401111 Primary open-angle glaucoma, right eye, mild stage: Secondary | ICD-10-CM | POA: Diagnosis not present

## 2024-07-23 DIAGNOSIS — H2513 Age-related nuclear cataract, bilateral: Secondary | ICD-10-CM | POA: Diagnosis not present

## 2024-07-23 DIAGNOSIS — H40052 Ocular hypertension, left eye: Secondary | ICD-10-CM | POA: Diagnosis not present

## 2024-07-23 DIAGNOSIS — H02831 Dermatochalasis of right upper eyelid: Secondary | ICD-10-CM | POA: Diagnosis not present

## 2024-08-12 ENCOUNTER — Ambulatory Visit (INDEPENDENT_AMBULATORY_CARE_PROVIDER_SITE_OTHER)

## 2024-08-12 DIAGNOSIS — I442 Atrioventricular block, complete: Secondary | ICD-10-CM

## 2024-08-13 LAB — CUP PACEART REMOTE DEVICE CHECK
Battery Remaining Longevity: 103 mo
Battery Voltage: 3.01 V
Brady Statistic AP VP Percent: 0 %
Brady Statistic AP VS Percent: 0 %
Brady Statistic AS VP Percent: 99.95 %
Brady Statistic AS VS Percent: 0.05 %
Brady Statistic RA Percent Paced: 0 %
Brady Statistic RV Percent Paced: 99.95 %
Date Time Interrogation Session: 20250827005051
Implantable Lead Connection Status: 753985
Implantable Lead Implant Date: 20210713
Implantable Lead Location: 753860
Implantable Lead Model: 5076
Implantable Pulse Generator Implant Date: 20210713
Lead Channel Impedance Value: 323 Ohm
Lead Channel Impedance Value: 3439 Ohm
Lead Channel Impedance Value: 3439 Ohm
Lead Channel Impedance Value: 380 Ohm
Lead Channel Pacing Threshold Amplitude: 0.875 V
Lead Channel Pacing Threshold Pulse Width: 0.4 ms
Lead Channel Sensing Intrinsic Amplitude: 15.875 mV
Lead Channel Sensing Intrinsic Amplitude: 15.875 mV
Lead Channel Setting Pacing Amplitude: 2 V
Lead Channel Setting Pacing Pulse Width: 0.4 ms
Lead Channel Setting Sensing Sensitivity: 0.9 mV
Zone Setting Status: 755011

## 2024-08-17 ENCOUNTER — Ambulatory Visit: Payer: Self-pay | Admitting: Internal Medicine

## 2024-08-31 NOTE — Progress Notes (Signed)
 Remote PPM Transmission

## 2024-09-01 DIAGNOSIS — H02831 Dermatochalasis of right upper eyelid: Secondary | ICD-10-CM | POA: Diagnosis not present

## 2024-09-01 DIAGNOSIS — H02834 Dermatochalasis of left upper eyelid: Secondary | ICD-10-CM | POA: Diagnosis not present

## 2024-09-01 DIAGNOSIS — H401111 Primary open-angle glaucoma, right eye, mild stage: Secondary | ICD-10-CM | POA: Diagnosis not present

## 2024-09-01 DIAGNOSIS — H04123 Dry eye syndrome of bilateral lacrimal glands: Secondary | ICD-10-CM | POA: Diagnosis not present

## 2024-09-01 DIAGNOSIS — H2513 Age-related nuclear cataract, bilateral: Secondary | ICD-10-CM | POA: Diagnosis not present

## 2024-09-01 DIAGNOSIS — H40052 Ocular hypertension, left eye: Secondary | ICD-10-CM | POA: Diagnosis not present

## 2024-09-28 DIAGNOSIS — H04123 Dry eye syndrome of bilateral lacrimal glands: Secondary | ICD-10-CM | POA: Diagnosis not present

## 2024-09-28 DIAGNOSIS — H02831 Dermatochalasis of right upper eyelid: Secondary | ICD-10-CM | POA: Diagnosis not present

## 2024-09-28 DIAGNOSIS — H2513 Age-related nuclear cataract, bilateral: Secondary | ICD-10-CM | POA: Diagnosis not present

## 2024-09-28 DIAGNOSIS — H40052 Ocular hypertension, left eye: Secondary | ICD-10-CM | POA: Diagnosis not present

## 2024-09-28 DIAGNOSIS — H401111 Primary open-angle glaucoma, right eye, mild stage: Secondary | ICD-10-CM | POA: Diagnosis not present

## 2024-09-28 DIAGNOSIS — H02834 Dermatochalasis of left upper eyelid: Secondary | ICD-10-CM | POA: Diagnosis not present

## 2024-10-07 DIAGNOSIS — D631 Anemia in chronic kidney disease: Secondary | ICD-10-CM | POA: Diagnosis not present

## 2024-10-07 DIAGNOSIS — I129 Hypertensive chronic kidney disease with stage 1 through stage 4 chronic kidney disease, or unspecified chronic kidney disease: Secondary | ICD-10-CM | POA: Diagnosis not present

## 2024-10-07 DIAGNOSIS — N1832 Chronic kidney disease, stage 3b: Secondary | ICD-10-CM | POA: Diagnosis not present

## 2024-10-07 DIAGNOSIS — I4891 Unspecified atrial fibrillation: Secondary | ICD-10-CM | POA: Diagnosis not present

## 2024-10-07 DIAGNOSIS — N189 Chronic kidney disease, unspecified: Secondary | ICD-10-CM | POA: Diagnosis not present

## 2024-10-07 DIAGNOSIS — N2581 Secondary hyperparathyroidism of renal origin: Secondary | ICD-10-CM | POA: Diagnosis not present

## 2024-10-07 DIAGNOSIS — I701 Atherosclerosis of renal artery: Secondary | ICD-10-CM | POA: Diagnosis not present

## 2024-10-07 DIAGNOSIS — E785 Hyperlipidemia, unspecified: Secondary | ICD-10-CM | POA: Diagnosis not present

## 2024-11-11 ENCOUNTER — Ambulatory Visit

## 2024-11-11 DIAGNOSIS — I442 Atrioventricular block, complete: Secondary | ICD-10-CM

## 2024-11-13 LAB — CUP PACEART REMOTE DEVICE CHECK
Battery Remaining Longevity: 101 mo
Battery Voltage: 3.01 V
Brady Statistic AP VP Percent: 0 %
Brady Statistic AP VS Percent: 0 %
Brady Statistic AS VP Percent: 99.95 %
Brady Statistic AS VS Percent: 0.05 %
Brady Statistic RA Percent Paced: 0 %
Brady Statistic RV Percent Paced: 99.95 %
Date Time Interrogation Session: 20251125233335
Implantable Lead Connection Status: 753985
Implantable Lead Implant Date: 20210713
Implantable Lead Location: 753860
Implantable Lead Model: 5076
Implantable Pulse Generator Implant Date: 20210713
Lead Channel Impedance Value: 342 Ohm
Lead Channel Impedance Value: 3439 Ohm
Lead Channel Impedance Value: 3439 Ohm
Lead Channel Impedance Value: 399 Ohm
Lead Channel Pacing Threshold Amplitude: 0.875 V
Lead Channel Pacing Threshold Pulse Width: 0.4 ms
Lead Channel Sensing Intrinsic Amplitude: 15.875 mV
Lead Channel Sensing Intrinsic Amplitude: 15.875 mV
Lead Channel Setting Pacing Amplitude: 2 V
Lead Channel Setting Pacing Pulse Width: 0.4 ms
Lead Channel Setting Sensing Sensitivity: 0.9 mV
Zone Setting Status: 755011

## 2024-11-17 NOTE — Progress Notes (Signed)
 Remote PPM Transmission

## 2024-11-19 ENCOUNTER — Ambulatory Visit: Payer: Self-pay | Admitting: Internal Medicine

## 2024-12-03 ENCOUNTER — Other Ambulatory Visit: Payer: Self-pay | Admitting: Internal Medicine

## 2024-12-04 MED ORDER — METOPROLOL SUCCINATE ER 25 MG PO TB24: 25.0000 mg | ORAL_TABLET | Freq: Every day | ORAL | 0 refills | Status: DC

## 2024-12-16 ENCOUNTER — Encounter: Payer: Self-pay | Admitting: Internal Medicine

## 2024-12-16 ENCOUNTER — Ambulatory Visit: Attending: Internal Medicine | Admitting: Internal Medicine

## 2024-12-16 VITALS — BP 140/70 | HR 62 | Ht 62.0 in | Wt 116.3 lb

## 2024-12-16 DIAGNOSIS — I4819 Other persistent atrial fibrillation: Secondary | ICD-10-CM

## 2024-12-16 DIAGNOSIS — I442 Atrioventricular block, complete: Secondary | ICD-10-CM

## 2024-12-16 LAB — CUP PACEART INCLINIC DEVICE CHECK
Date Time Interrogation Session: 20251231212420
Implantable Lead Connection Status: 753985
Implantable Lead Implant Date: 20210713
Implantable Lead Location: 753860
Implantable Lead Model: 5076
Implantable Pulse Generator Implant Date: 20210713

## 2024-12-16 NOTE — Progress Notes (Signed)
 "     HPI Ms. Nissley returns today for followup. She is a pleasant 89 yo woman with a h/o CHB, s/p PPM Insertion with early RV lead failure and was then found to have an occluded left SCV. She had extraction of her atrial lead and insertion of a new RV lead but due to scar tissue could not get another atrial lead in place. 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 She has reverted to atrial fib. She is taking her beta blocker but stopped taking the amiodarone . She denies chest pain but does have sob with exertion. Overall she feels well though she gets tired a little faster than she would like. However she has not been in the hospital. She feels well.  Allergies[1]   Current Outpatient Medications  Medication Sig Dispense Refill   amLODipine  (NORVASC ) 2.5 MG tablet Take 2.5 mg by mouth daily.   3   apixaban  (ELIQUIS ) 2.5 MG TABS tablet Take 1 tablet (2.5 mg total) by mouth 2 (two) times daily. 60 tablet 5   furosemide  (LASIX ) 40 MG tablet TAKE 1 TABLET BY MOUTH EVERY DAY 90 tablet 3   latanoprost (XALATAN) 0.005 % ophthalmic solution Place 1 drop into both eyes at bedtime.     levothyroxine  (SYNTHROID , LEVOTHROID) 75 MCG tablet Take 75 mcg by mouth daily before breakfast.     metoprolol  succinate (TOPROL -XL) 25 MG 24 hr tablet Take 1 tablet (25 mg total) by mouth daily. 30 tablet 0   nitroGLYCERIN  (NITROSTAT ) 0.4 MG SL tablet Place 0.4 mg under the tongue every 5 (five) minutes x 3 doses as needed for chest pain.     No current facility-administered medications for this visit.     Past Medical History:  Diagnosis Date   Arthritis    RA IN HANDS   Chronic congestive heart failure with left ventricular diastolic dysfunction (HCC)    CKD (chronic kidney disease)    Complete heart block (HCC)    Hypertension    Hypoglycemia    Hypothyroidism    Nephrolithiasis    Osteoporosis    Persistent atrial fibrillation (HCC)    Presence of  permanent cardiac pacemaker    Thyroid  disease    Typical atrial flutter (HCC)     ROS:   All systems reviewed and negative except as noted in the HPI.   Past Surgical History:  Procedure Laterality Date   APPENDECTOMY     CARDIOVERSION N/A 05/14/2017   Procedure: CARDIOVERSION;  Surgeon: Raford Riggs, MD;  Location: [facility] ENDOSCOPY;  Service: Cardiovascular;  Laterality: N/A;   CARDIOVERSION N/A 07/23/2018   Procedure: CARDIOVERSION;  Surgeon: Pietro Redell RAMAN, MD;  Location: [facility] ENDOSCOPY;  Service: Cardiovascular;  Laterality: N/A;   INSERT / REPLACE / REMOVE PACEMAKER     LEAD REVISION/REPAIR N/A 06/09/2020   Procedure: LEAD REVISION/REPAIR;  Surgeon: Kelsie Agent, MD;  Location: [facility] INVASIVE CV LAB;  Service: Cardiovascular;  Laterality: N/A;   PACEMAKER IMPLANT N/A 06/06/2017   Procedure: Pacemaker Implant;  Surgeon: Kelsie Agent, MD;  Location: [facility] INVASIVE CV LAB;  Service: Cardiovascular;  Laterality: N/A;   PACEMAKER REVISION N/A 06/28/2020   Procedure: PACEMAKER SYSTEM REVISION;  Surgeon: Waddell Danelle ORN, MD;  Location: [facility] INVASIVE CV LAB;  Service: Cardiovascular;  Laterality: N/A;  BARTLE BACK UP   PPM GENERATOR REMOVAL N/A 06/09/2020   Procedure: PPM GENERATOR REMOVAL;  Surgeon: Kelsie Agent, MD;  Location: [facility] INVASIVE CV  LAB;  Service: Cardiovascular;  Laterality: N/A;     Family History  Problem Relation Age of Onset   Breast cancer Mother    Lung cancer Father      Social History   Socioeconomic History   Marital status: Married    Spouse name: Not on file   Number of children: 1   Years of education: Not on file   Highest education level: Not on file  Occupational History   Not on file  Tobacco Use   Smoking status: Former    Current packs/day: 0.00    Types: Cigarettes    Quit date: 05/18/1969    Years since quitting: 55.6   Smokeless tobacco: Never  Vaping Use   Vaping status: Never Used  Substance and Sexual Activity   Alcohol  use: No   Drug  use: No   Sexual activity: Not on file  Other Topics Concern   Not on file  Social History Narrative   Lives in [place] with her spouse.  Retired   [employer]   Tobacco Use: Medium Risk (12/16/2024)   Patient History    Smoking Tobacco Use: Former    Smokeless Tobacco Use: Never    Passive Exposure: Not on [employer] Strain: Not on file  Food Insecurity: Low Risk (12/03/2024)   Received from [facility]   Epic    Within the past 12 months, you worried that your food would run out before you got money to buy more: Never true    Within the past 12 months, the food you bought just didn't last and you didn't have money to get more. : Never true  Transportation Needs: No Transportation Needs (12/03/2024)   Received from [facility]    In the past 12 months, has lack of reliable transportation kept you from medical appointments, meetings, work or from getting things needed for daily living? : No  Physical Activity: Not on file  Stress: Not on file  Social Connections: Not on file  Intimate Partner Violence: Not on file  Depression (EYV7-0): Not on file  Alcohol  Screen: Not on file  Housing: Low Risk (12/03/2024)   Received from [facility]   Epic    What is your living situation today?: I have a steady place to live    Think about the place you live. Do you have problems with any of the following? Choose all that apply:: None/None on this list  Utilities: Low Risk (12/03/2024)   Received from [facility]   Utilities    In the past 12 months has the electric, gas, oil, or water company threatened to shut off services in your home? : No  Health Literacy: Not on file     BP (!) 140/70 (BP Location: Left Arm, Patient Position: Sitting, Cuff Size: Normal)   Pulse 62   Ht 5' 2 (1.575 m)   Wt 116 lb 4.8 oz (52.8 kg)   LMP  (LMP Unknown)   SpO2 97%   BMI 21.27 kg/m   Physical Exam:  Well appearing elderly woman, NAD HEENT:  Unremarkable Neck:  No JVD, no thyromegally Lymphatics:  No adenopathy Back:  No CVA tenderness Lungs:  Clear with no wheezes HEART:  Regular rate rhythm, no murmurs, no rubs, no clicks Abd:  soft, positive bowel sounds, no organomegally, no rebound, no guarding Ext:  2 plus pulses, no edema, no cyanosis, no clubbing Skin:  No rashes no nodules Neuro:  CN II through XII intact, motor grossly intact  EKG - atrial fib with ventricular pacing  DEVICE  Normal device function.  See PaceArt for details.   Assess/Plan:  CHB - she is stable after PPM revision. 2. PPM - she has a single lead in the ventricle. Her single chamber medtronic PPM is working normally 3. 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 Her edema has resolved.  4. HTN -her sbp is better. She will maintain a low sodium diet.   Danelle Gregory Barrick,MD    [1]  Allergies Allergen Reactions   Alendronate Sodium Other (See Comments)    GI UPSET   "

## 2024-12-16 NOTE — Patient Instructions (Addendum)
 Medication Instructions:  Your physician recommends that you continue on your current medications as directed. Please refer to the Current Medication list given to you today.  *If you need a refill on your cardiac medications before your next appointment, please call your pharmacy*  Lab Work: None ordered.  You may go to any [facility] Location for your lab work:  [facility] - [address] ([facility]) - [address] [phone] [address]  [place] - [address]   [place]  - [address]   [place] - [address] - [address] [place]  - [address] [place] - [address] ([facility]   If you have labs (blood work) drawn today and your tests are completely normal, you will receive your results only by: [employer] (if you have MyChart)  If you have any lab test that is abnormal or we need to change your treatment, we will call you or send a MyChart message to review the results.  Testing/Procedures: None ordered.  Follow-Up: At [facility], you and your health needs are our priority.  As part of our continuing mission to provide you with exceptional heart care, we have created designated Provider Care Teams.  These Care Teams include your primary Cardiologist (physician) and Advanced Practice Providers (APPs -  Physician Assistants and Nurse Practitioners) who all work together to provide you with the care you need, when you need it.  We recommend signing up for the patient portal called MyChart.  Sign up information is provided on this After Visit Summary.  MyChart is used to connect with patients for Virtual Visits (Telemedicine).  Patients are able to view lab/test results, encounter notes, upcoming appointments, etc.  Non-urgent messages can be sent to your provider as well.   To learn more about what you can do with MyChart, go to  [email or web address].    Your next appointment:   1 year(s)

## 2024-12-27 ENCOUNTER — Other Ambulatory Visit: Payer: Self-pay | Admitting: Internal Medicine

## 2024-12-27 DIAGNOSIS — I4819 Other persistent atrial fibrillation: Secondary | ICD-10-CM

## 2025-01-01 ENCOUNTER — Other Ambulatory Visit: Payer: Self-pay

## 2025-01-04 NOTE — Telephone Encounter (Signed)
 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 Overdue for all Labs.  In accordance with refill protocols, please review and address the following requirements before this medication refill can be authorized:  Labs

## 2025-01-05 NOTE — Telephone Encounter (Signed)
 Triage,  Not sure why this was routed to me. Please advise on refill request. Pt seen in office by [facility] on 12/16/24, but rx request indicating that pt needs labs. Last labs in chart are from 2022, also checked care everywhere but did not see that any lab there either.  Thanks,  Hanni Milford

## 2025-01-06 MED ORDER — FUROSEMIDE 40 MG PO TABS: 40.0000 mg | ORAL_TABLET | Freq: Every day | ORAL | 0 refills | Status: AC

## 2025-01-06 NOTE — Telephone Encounter (Signed)
 Notes/labs from PCP visit 04/02/24 in CareEverywhere:  Cr - 1.3 Na - 139 K- 4.4 Cl - 103 Ca - 9.4 Blood Pressure 126/84

## 2025-02-10 ENCOUNTER — Ambulatory Visit

## 2025-05-12 ENCOUNTER — Ambulatory Visit

## 2025-08-11 ENCOUNTER — Ambulatory Visit
